# Patient Record
Sex: Female | Born: 1935 | Race: White | Hispanic: No | Smoking: Never smoker
Health system: Southern US, Community
[De-identification: ages and names within clinical notes are randomized; demographics above are authoritative.]

## PROBLEM LIST (undated history)

## (undated) DIAGNOSIS — N390 Urinary tract infection, site not specified: Secondary | ICD-10-CM

## (undated) DIAGNOSIS — G8929 Other chronic pain: Secondary | ICD-10-CM

## (undated) DIAGNOSIS — F0390 Unspecified dementia without behavioral disturbance: Secondary | ICD-10-CM

## (undated) DIAGNOSIS — M810 Age-related osteoporosis without current pathological fracture: Secondary | ICD-10-CM

## (undated) DIAGNOSIS — I2510 Atherosclerotic heart disease of native coronary artery without angina pectoris: Secondary | ICD-10-CM

## (undated) DIAGNOSIS — J449 Chronic obstructive pulmonary disease, unspecified: Secondary | ICD-10-CM

## (undated) DIAGNOSIS — I5032 Chronic diastolic (congestive) heart failure: Secondary | ICD-10-CM

## (undated) DIAGNOSIS — K56609 Unspecified intestinal obstruction, unspecified as to partial versus complete obstruction: Principal | ICD-10-CM

## (undated) DIAGNOSIS — N289 Disorder of kidney and ureter, unspecified: Secondary | ICD-10-CM

## (undated) DIAGNOSIS — K219 Gastro-esophageal reflux disease without esophagitis: Secondary | ICD-10-CM

## (undated) DIAGNOSIS — F329 Major depressive disorder, single episode, unspecified: Secondary | ICD-10-CM

## (undated) DIAGNOSIS — J42 Unspecified chronic bronchitis: Secondary | ICD-10-CM

## (undated) DIAGNOSIS — G20 Parkinson's disease: Secondary | ICD-10-CM

## (undated) DIAGNOSIS — K5900 Constipation, unspecified: Secondary | ICD-10-CM

## (undated) DIAGNOSIS — E785 Hyperlipidemia, unspecified: Secondary | ICD-10-CM

## (undated) DIAGNOSIS — G20A1 Parkinson's disease without dyskinesia, without mention of fluctuations: Secondary | ICD-10-CM

## (undated) DIAGNOSIS — A6009 Herpesviral infection of other urogenital tract: Secondary | ICD-10-CM

## (undated) DIAGNOSIS — E039 Hypothyroidism, unspecified: Secondary | ICD-10-CM

## (undated) DIAGNOSIS — R55 Syncope and collapse: Secondary | ICD-10-CM

## (undated) DIAGNOSIS — E079 Disorder of thyroid, unspecified: Secondary | ICD-10-CM

## (undated) DIAGNOSIS — D649 Anemia, unspecified: Secondary | ICD-10-CM

## (undated) DIAGNOSIS — F32A Depression, unspecified: Secondary | ICD-10-CM

## (undated) DIAGNOSIS — I10 Essential (primary) hypertension: Secondary | ICD-10-CM

## (undated) DIAGNOSIS — F419 Anxiety disorder, unspecified: Secondary | ICD-10-CM

## (undated) DIAGNOSIS — G629 Polyneuropathy, unspecified: Secondary | ICD-10-CM

## (undated) HISTORY — PX: FRACTURE SURGERY: SHX138

## (undated) HISTORY — DX: Herpesviral infection of other urogenital tract: A60.09

## (undated) HISTORY — DX: Hypothyroidism, unspecified: E03.9

## (undated) HISTORY — DX: Unspecified chronic bronchitis: J42

## (undated) HISTORY — DX: Urinary tract infection, site not specified: N39.0

## (undated) HISTORY — DX: Chronic diastolic (congestive) heart failure: I50.32

## (undated) HISTORY — DX: Constipation, unspecified: K59.00

## (undated) HISTORY — DX: Unspecified intestinal obstruction, unspecified as to partial versus complete obstruction: K56.609

---

## 2000-12-03 ENCOUNTER — Encounter: Payer: Self-pay | Admitting: Internal Medicine

## 2000-12-03 ENCOUNTER — Encounter: Admission: RE | Admit: 2000-12-03 | Discharge: 2000-12-03 | Payer: Self-pay | Admitting: Internal Medicine

## 2001-03-26 ENCOUNTER — Encounter: Payer: Self-pay | Admitting: Emergency Medicine

## 2001-03-26 ENCOUNTER — Emergency Department (HOSPITAL_COMMUNITY): Admission: EM | Admit: 2001-03-26 | Discharge: 2001-03-26 | Payer: Self-pay | Admitting: Emergency Medicine

## 2003-03-09 ENCOUNTER — Ambulatory Visit (HOSPITAL_COMMUNITY): Admission: RE | Admit: 2003-03-09 | Discharge: 2003-03-09 | Payer: Self-pay | Admitting: *Deleted

## 2003-03-09 ENCOUNTER — Encounter (INDEPENDENT_AMBULATORY_CARE_PROVIDER_SITE_OTHER): Payer: Self-pay | Admitting: *Deleted

## 2003-10-05 ENCOUNTER — Ambulatory Visit (HOSPITAL_COMMUNITY): Admission: RE | Admit: 2003-10-05 | Discharge: 2003-10-05 | Payer: Self-pay | Admitting: *Deleted

## 2004-03-20 ENCOUNTER — Ambulatory Visit (HOSPITAL_COMMUNITY): Admission: RE | Admit: 2004-03-20 | Discharge: 2004-03-20 | Payer: Self-pay | Admitting: Internal Medicine

## 2005-07-12 ENCOUNTER — Inpatient Hospital Stay (HOSPITAL_COMMUNITY): Admission: EM | Admit: 2005-07-12 | Discharge: 2005-07-18 | Payer: Self-pay | Admitting: Emergency Medicine

## 2005-07-14 ENCOUNTER — Encounter: Payer: Self-pay | Admitting: Neurosurgery

## 2005-07-18 ENCOUNTER — Ambulatory Visit: Payer: Self-pay | Admitting: Physical Medicine & Rehabilitation

## 2005-07-18 ENCOUNTER — Inpatient Hospital Stay (HOSPITAL_COMMUNITY)
Admission: RE | Admit: 2005-07-18 | Discharge: 2005-08-07 | Payer: Self-pay | Admitting: Physical Medicine & Rehabilitation

## 2006-03-13 ENCOUNTER — Ambulatory Visit: Payer: Self-pay | Admitting: Cardiology

## 2006-03-13 ENCOUNTER — Inpatient Hospital Stay (HOSPITAL_COMMUNITY): Admission: EM | Admit: 2006-03-13 | Discharge: 2006-03-18 | Payer: Self-pay | Admitting: Emergency Medicine

## 2006-03-17 ENCOUNTER — Encounter: Payer: Self-pay | Admitting: Internal Medicine

## 2006-03-17 ENCOUNTER — Ambulatory Visit: Payer: Self-pay | Admitting: Internal Medicine

## 2007-02-28 ENCOUNTER — Emergency Department (HOSPITAL_COMMUNITY): Admission: EM | Admit: 2007-02-28 | Discharge: 2007-02-28 | Payer: Self-pay | Admitting: Emergency Medicine

## 2010-05-21 ENCOUNTER — Emergency Department (HOSPITAL_COMMUNITY)
Admission: EM | Admit: 2010-05-21 | Discharge: 2010-05-22 | Disposition: A | Discharge status: Other Acute Inpatient Hospital | Payer: Self-pay | Source: Home / Self Care | Admitting: Emergency Medicine

## 2010-06-27 ENCOUNTER — Emergency Department (HOSPITAL_COMMUNITY): Payer: Medicare Other

## 2010-06-27 ENCOUNTER — Inpatient Hospital Stay (HOSPITAL_COMMUNITY)
Admission: EM | Admit: 2010-06-27 | Discharge: 2010-07-04 | DRG: 480 | Disposition: A | Discharge status: Skilled Nursing Facility | Payer: Medicare Other | Attending: Internal Medicine | Admitting: Internal Medicine

## 2010-06-27 DIAGNOSIS — E039 Hypothyroidism, unspecified: Secondary | ICD-10-CM | POA: Diagnosis present

## 2010-06-27 DIAGNOSIS — Y9210 Unspecified residential institution as the place of occurrence of the external cause: Secondary | ICD-10-CM | POA: Diagnosis present

## 2010-06-27 DIAGNOSIS — F329 Major depressive disorder, single episode, unspecified: Secondary | ICD-10-CM | POA: Diagnosis present

## 2010-06-27 DIAGNOSIS — S72143A Displaced intertrochanteric fracture of unspecified femur, initial encounter for closed fracture: Principal | ICD-10-CM | POA: Diagnosis present

## 2010-06-27 DIAGNOSIS — I5033 Acute on chronic diastolic (congestive) heart failure: Secondary | ICD-10-CM | POA: Diagnosis not present

## 2010-06-27 DIAGNOSIS — K219 Gastro-esophageal reflux disease without esophagitis: Secondary | ICD-10-CM | POA: Diagnosis present

## 2010-06-27 DIAGNOSIS — F0390 Unspecified dementia without behavioral disturbance: Secondary | ICD-10-CM | POA: Diagnosis present

## 2010-06-27 DIAGNOSIS — I129 Hypertensive chronic kidney disease with stage 1 through stage 4 chronic kidney disease, or unspecified chronic kidney disease: Secondary | ICD-10-CM | POA: Diagnosis present

## 2010-06-27 DIAGNOSIS — E785 Hyperlipidemia, unspecified: Secondary | ICD-10-CM | POA: Diagnosis present

## 2010-06-27 DIAGNOSIS — R5381 Other malaise: Secondary | ICD-10-CM | POA: Diagnosis present

## 2010-06-27 DIAGNOSIS — Z96649 Presence of unspecified artificial hip joint: Secondary | ICD-10-CM

## 2010-06-27 DIAGNOSIS — G589 Mononeuropathy, unspecified: Secondary | ICD-10-CM | POA: Diagnosis present

## 2010-06-27 DIAGNOSIS — T4275XA Adverse effect of unspecified antiepileptic and sedative-hypnotic drugs, initial encounter: Secondary | ICD-10-CM | POA: Diagnosis not present

## 2010-06-27 DIAGNOSIS — I9589 Other hypotension: Secondary | ICD-10-CM | POA: Diagnosis not present

## 2010-06-27 DIAGNOSIS — N183 Chronic kidney disease, stage 3 unspecified: Secondary | ICD-10-CM | POA: Diagnosis present

## 2010-06-27 DIAGNOSIS — F3289 Other specified depressive episodes: Secondary | ICD-10-CM | POA: Diagnosis present

## 2010-06-27 DIAGNOSIS — I2510 Atherosclerotic heart disease of native coronary artery without angina pectoris: Secondary | ICD-10-CM | POA: Diagnosis present

## 2010-06-27 DIAGNOSIS — N179 Acute kidney failure, unspecified: Secondary | ICD-10-CM | POA: Diagnosis not present

## 2010-06-27 DIAGNOSIS — I509 Heart failure, unspecified: Secondary | ICD-10-CM | POA: Diagnosis present

## 2010-06-27 DIAGNOSIS — Y9301 Activity, walking, marching and hiking: Secondary | ICD-10-CM

## 2010-06-27 DIAGNOSIS — G9389 Other specified disorders of brain: Secondary | ICD-10-CM | POA: Diagnosis not present

## 2010-06-27 DIAGNOSIS — W010XXA Fall on same level from slipping, tripping and stumbling without subsequent striking against object, initial encounter: Secondary | ICD-10-CM | POA: Diagnosis present

## 2010-06-27 DIAGNOSIS — D62 Acute posthemorrhagic anemia: Secondary | ICD-10-CM | POA: Diagnosis not present

## 2010-06-27 DIAGNOSIS — S52599A Other fractures of lower end of unspecified radius, initial encounter for closed fracture: Secondary | ICD-10-CM | POA: Diagnosis present

## 2010-06-27 DIAGNOSIS — N039 Chronic nephritic syndrome with unspecified morphologic changes: Secondary | ICD-10-CM | POA: Diagnosis present

## 2010-06-27 DIAGNOSIS — D631 Anemia in chronic kidney disease: Secondary | ICD-10-CM | POA: Diagnosis present

## 2010-06-27 LAB — BASIC METABOLIC PANEL
BUN: 22 mg/dL (ref 6–23)
Chloride: 102 mEq/L (ref 96–112)
Glucose, Bld: 103 mg/dL — ABNORMAL HIGH (ref 70–99)
Sodium: 140 mEq/L (ref 135–145)

## 2010-06-27 LAB — CARDIAC PANEL(CRET KIN+CKTOT+MB+TROPI)
CK, MB: 2.4 ng/mL (ref 0.3–4.0)
Relative Index: 2.2 (ref 0.0–2.5)
Total CK: 111 U/L (ref 7–177)
Troponin I: 0.01 ng/mL (ref 0.00–0.06)

## 2010-06-27 LAB — URINALYSIS, ROUTINE W REFLEX MICROSCOPIC
Ketones, ur: NEGATIVE mg/dL
Nitrite: NEGATIVE
Protein, ur: NEGATIVE mg/dL
Urobilinogen, UA: 0.2 mg/dL (ref 0.0–1.0)
pH: 7 (ref 5.0–8.0)

## 2010-06-27 LAB — APTT: aPTT: 30 seconds (ref 24–37)

## 2010-06-27 LAB — DIFFERENTIAL
Basophils Relative: 1 % (ref 0–1)
Eosinophils Relative: 8 % — ABNORMAL HIGH (ref 0–5)
Lymphocytes Relative: 23 % (ref 12–46)
Monocytes Absolute: 1.1 10*3/uL — ABNORMAL HIGH (ref 0.1–1.0)
Neutro Abs: 6.2 10*3/uL (ref 1.7–7.7)
Neutrophils Relative %: 58 % (ref 43–77)

## 2010-06-27 LAB — CBC
HCT: 41.6 % (ref 36.0–46.0)
MCV: 88.1 fL (ref 78.0–100.0)
Platelets: 325 10*3/uL (ref 150–400)
RBC: 4.72 MIL/uL (ref 3.87–5.11)
RDW: 15.7 % — ABNORMAL HIGH (ref 11.5–15.5)

## 2010-06-28 ENCOUNTER — Inpatient Hospital Stay (HOSPITAL_COMMUNITY): Payer: Medicare Other

## 2010-06-28 LAB — COMPREHENSIVE METABOLIC PANEL
ALT: 13 U/L (ref 0–35)
AST: 28 U/L (ref 0–37)
Calcium: 8.2 mg/dL — ABNORMAL LOW (ref 8.4–10.5)
Creatinine, Ser: 1.1 mg/dL (ref 0.4–1.2)
GFR calc Af Amer: 56 mL/min — ABNORMAL LOW (ref >60)
Sodium: 140 mEq/L (ref 135–145)
Total Protein: 5.5 g/dL — ABNORMAL LOW (ref 6.0–8.3)

## 2010-06-28 LAB — CBC
HCT: 32.7 % — ABNORMAL LOW (ref 36.0–46.0)
Hemoglobin: 10.7 g/dL — ABNORMAL LOW (ref 12.0–15.0)
MCHC: 32.7 g/dL (ref 30.0–36.0)
RDW: 15.9 % — ABNORMAL HIGH (ref 11.5–15.5)
WBC: 12.5 10*3/uL — ABNORMAL HIGH (ref 4.0–10.5)

## 2010-06-28 LAB — CARDIAC PANEL(CRET KIN+CKTOT+MB+TROPI)
CK, MB: 2 ng/mL (ref 0.3–4.0)
Relative Index: 0.9 (ref 0.0–2.5)
Troponin I: 0.01 ng/mL (ref 0.00–0.06)

## 2010-06-28 LAB — URINE CULTURE: Culture: NO GROWTH

## 2010-06-28 LAB — TSH: TSH: 1.42 u[IU]/mL (ref 0.350–4.500)

## 2010-06-29 LAB — IRON AND TIBC
Saturation Ratios: 32 % (ref 20–55)
UIBC: 159 ug/dL

## 2010-06-29 LAB — BASIC METABOLIC PANEL
BUN: 16 mg/dL (ref 6–23)
CO2: 27 mEq/L (ref 19–32)
Calcium: 8.1 mg/dL — ABNORMAL LOW (ref 8.4–10.5)
Chloride: 109 mEq/L (ref 96–112)
Creatinine, Ser: 0.91 mg/dL (ref 0.4–1.2)
GFR calc Af Amer: >60 mL/min (ref >60)
Glucose, Bld: 91 mg/dL (ref 70–99)

## 2010-06-29 LAB — CBC
Hemoglobin: 8.7 g/dL — ABNORMAL LOW (ref 12.0–15.0)
MCH: 28.2 pg (ref 26.0–34.0)
MCHC: 31.9 g/dL (ref 30.0–36.0)
MCV: 88.6 fL (ref 78.0–100.0)
RBC: 3.08 MIL/uL — ABNORMAL LOW (ref 3.87–5.11)

## 2010-06-29 LAB — TSH: TSH: 1.097 u[IU]/mL (ref 0.350–4.500)

## 2010-06-29 LAB — FERRITIN: Ferritin: 38 ng/mL (ref 10–291)

## 2010-06-29 LAB — FOLATE: Folate: 10.7 ng/mL

## 2010-06-29 LAB — VITAMIN B12: Vitamin B-12: 273 pg/mL (ref 211–911)

## 2010-06-30 LAB — BASIC METABOLIC PANEL
BUN: 21 mg/dL (ref 6–23)
Creatinine, Ser: 0.95 mg/dL (ref 0.4–1.2)
GFR calc non Af Amer: 55 mL/min — ABNORMAL LOW (ref >60)
Glucose, Bld: 92 mg/dL (ref 70–99)
Potassium: 4.1 mEq/L (ref 3.5–5.1)

## 2010-06-30 LAB — PROTIME-INR
INR: 1.12 (ref 0.00–1.49)
Prothrombin Time: 14.6 seconds (ref 11.6–15.2)

## 2010-06-30 LAB — CBC
Hemoglobin: 8.3 g/dL — ABNORMAL LOW (ref 12.0–15.0)
MCH: 28.1 pg (ref 26.0–34.0)
MCHC: 32 g/dL (ref 30.0–36.0)
Platelets: 200 10*3/uL (ref 150–400)
RDW: 16.2 % — ABNORMAL HIGH (ref 11.5–15.5)

## 2010-07-01 ENCOUNTER — Inpatient Hospital Stay (HOSPITAL_COMMUNITY): Payer: Medicare Other

## 2010-07-01 LAB — BASIC METABOLIC PANEL
BUN: 27 mg/dL — ABNORMAL HIGH (ref 6–23)
CO2: 24 mEq/L (ref 19–32)
Calcium: 7.8 mg/dL — ABNORMAL LOW (ref 8.4–10.5)
Calcium: 8 mg/dL — ABNORMAL LOW (ref 8.4–10.5)
Creatinine, Ser: 1.05 mg/dL (ref 0.4–1.2)
GFR calc non Af Amer: 49 mL/min — ABNORMAL LOW (ref >60)
Glucose, Bld: 89 mg/dL (ref 70–99)
Glucose, Bld: 108 mg/dL — ABNORMAL HIGH (ref 70–99)
Sodium: 140 mEq/L (ref 135–145)

## 2010-07-01 LAB — CROSSMATCH
Antibody Screen: NEGATIVE
Unit division: 0

## 2010-07-01 LAB — CBC
HCT: 24.2 % — ABNORMAL LOW (ref 36.0–46.0)
MCHC: 32.2 g/dL (ref 30.0–36.0)
Platelets: 207 10*3/uL (ref 150–400)
RDW: 16.2 % — ABNORMAL HIGH (ref 11.5–15.5)

## 2010-07-01 LAB — BRAIN NATRIURETIC PEPTIDE: Pro B Natriuretic peptide (BNP): 490 pg/mL — ABNORMAL HIGH (ref 0.0–100.0)

## 2010-07-01 LAB — PROTIME-INR: Prothrombin Time: 14.3 seconds (ref 11.6–15.2)

## 2010-07-02 LAB — BASIC METABOLIC PANEL
Chloride: 111 mEq/L (ref 96–112)
Creatinine, Ser: 0.95 mg/dL (ref 0.4–1.2)
GFR calc Af Amer: >60 mL/min (ref >60)
GFR calc non Af Amer: 55 mL/min — ABNORMAL LOW (ref >60)
Potassium: 3.9 mEq/L (ref 3.5–5.1)

## 2010-07-02 LAB — CBC
HCT: 26.1 % — ABNORMAL LOW (ref 36.0–46.0)
Hemoglobin: 8.2 g/dL — ABNORMAL LOW (ref 12.0–15.0)
RDW: 16.3 % — ABNORMAL HIGH (ref 11.5–15.5)
WBC: 10 10*3/uL (ref 4.0–10.5)

## 2010-07-02 LAB — PROTIME-INR
INR: 1.41 (ref 0.00–1.49)
Prothrombin Time: 17.5 seconds — ABNORMAL HIGH (ref 11.6–15.2)

## 2010-07-03 ENCOUNTER — Inpatient Hospital Stay (HOSPITAL_COMMUNITY): Payer: Medicare Other

## 2010-07-03 LAB — CBC
MCV: 89.6 fL (ref 78.0–100.0)
Platelets: 316 10*3/uL (ref 150–400)
RBC: 3.17 MIL/uL — ABNORMAL LOW (ref 3.87–5.11)
RDW: 16.6 % — ABNORMAL HIGH (ref 11.5–15.5)
WBC: 11.7 10*3/uL — ABNORMAL HIGH (ref 4.0–10.5)

## 2010-07-03 LAB — BASIC METABOLIC PANEL
CO2: 26 mEq/L (ref 19–32)
Calcium: 8.2 mg/dL — ABNORMAL LOW (ref 8.4–10.5)
Creatinine, Ser: 1.04 mg/dL (ref 0.4–1.2)
GFR calc Af Amer: >60 mL/min (ref >60)
GFR calc non Af Amer: 50 mL/min — ABNORMAL LOW (ref >60)
Sodium: 141 mEq/L (ref 135–145)

## 2010-07-03 LAB — BRAIN NATRIURETIC PEPTIDE: Pro B Natriuretic peptide (BNP): 1060 pg/mL — ABNORMAL HIGH (ref 0.0–100.0)

## 2010-07-03 LAB — PROTIME-INR: INR: 1.62 — ABNORMAL HIGH (ref 0.00–1.49)

## 2010-07-04 LAB — CBC
MCV: 90.6 fL (ref 78.0–100.0)
Platelets: 343 10*3/uL (ref 150–400)
RBC: 3.1 MIL/uL — ABNORMAL LOW (ref 3.87–5.11)
RDW: 17.3 % — ABNORMAL HIGH (ref 11.5–15.5)
WBC: 12.4 10*3/uL — ABNORMAL HIGH (ref 4.0–10.5)

## 2010-07-04 LAB — BASIC METABOLIC PANEL
CO2: 26 mEq/L (ref 19–32)
Calcium: 7.7 mg/dL — ABNORMAL LOW (ref 8.4–10.5)
Creatinine, Ser: 0.95 mg/dL (ref 0.4–1.2)
GFR calc Af Amer: >60 mL/min (ref >60)
GFR calc non Af Amer: 55 mL/min — ABNORMAL LOW (ref >60)
Sodium: 141 mEq/L (ref 135–145)

## 2010-07-04 LAB — PROTIME-INR
INR: 2.72 — ABNORMAL HIGH (ref 0.00–1.49)
Prothrombin Time: 28.9 seconds — ABNORMAL HIGH (ref 11.6–15.2)

## 2010-07-06 NOTE — Discharge Summary (Signed)
NAME:  [name] [name], [name] [name]                 ACCOUNT NO.:  [id number]  MEDICAL RECORD NO.:  [id number]           PATIENT TYPE:  I  LOCATION:  1603                         FACILITY:  [facility]  PHYSICIAN:  Hillery Aldo, M.D.   DATE OF BIRTH:  May 31, 1917  DATE OF ADMISSION:  06/27/2010 DATE OF DISCHARGE:  07/04/2010                              DISCHARGE SUMMARY   PRIMARY CARE PHYSICIAN:  Lenon Curt. Chilton Si, M.D.  DISCHARGE DIAGNOSES: 1. Left hip fracture. 2. Left radius fracture. 3. Respiratory depression secondary to narcotics. 4. Hypotension. 5. Acute-on-chronic diastolic congestive heart failure. 6. Acute kidney injury. 7. Stage 3 chronic kidney disease. 8. Acute blood loss anemia in the setting of anemia of chronic     disease. 9. Hypertension. 10.Hyperlipidemia. 11.Hypothyroidism. 12.Dementia. 13.Depression. 14.Neuropathy. 15.Gastroesophageal reflux disease. 16.History of coronary artery disease. 17.Deconditioning.  DISCHARGE MEDICATIONS: 1. Coreg 6.25 mg p.o. b.i.d. 2. Lovenox 30 mg subcutaneously b.i.d. until INR therapeutic for 48     hours. 3. Ferrous sulfate 325 mg p.o. b.i.d. 4. Oxycodone 5/325 one tablet p.o. q.4 h. p.r.n. pain. 5. Warfarin 6 mg p.o. daily or as directed by PCP to keep INR between     2 and 3. 6. Acyclovir 200 mg p.o. daily. 7. Aspirin 81 mg p.o. daily. 8. Benazepril 40 mg p.o. daily. 9. Furosemide 40 mg p.o. daily. 10.Gabapentin 100 mg p.o. t.i.d. 11.Isosorbide mononitrate XR 30 mg p.o. daily. 12.Klor-Con 20 mEq p.o. daily. 13.Levothyroxine 25 mcg, 1/2 tablet p.o. daily. 14.Lexapro 20 mg p.o. daily. 15.Lipitor 10 mg p.o. daily. 16.Namenda 10 mg p.o. b.i.d. 17.Omeprazole 20 mg p.o. daily. 18.Os-Cal plus D 1 tablet p.o. daily. 19.Polyethylene glycol 17 g p.o. daily. 20.Senokot-S 8.6-50 two tablets p.o. q.h.s. 21.Seroquel 25 mg p.o. q.h.s. 22.Tylenol 650 mg p.o. q.6 h. p.r.n. headache, fever, or general     aches. 23.Xanax 0.25 mg p.o. q.h.s.  p.r.n. sleep.  CONSULTATIONS:  Claude Manges. Cleophas Dunker, M.D., of Orthopedics.  BRIEF ADMISSION HISTORY OF PRESENT ILLNESS:  The patient is a 77 year old female who had a mechanical fall in her home and was brought to the emergency department for evaluation where she was diagnosed with an acute left hip fracture.  She subsequently was referred to the Hospitalist Service for inpatient management.  For the full details, please see the dictated report done by Dr. Selena Batten.  PROCEDURES AND DIAGNOSTIC STUDIES: 1. Chest x-ray on June 27, 2010, showed mild left basilar     atelectasis. 2. Right hand films on June 27, 2010, showed a nondisplaced     impaction fracture of the distal radius. 3. Left hip films on June 27, 2010, showed a left hip     intertrochanteric fracture. 4. Status post ORIF of left intertrochanteric hip fracture performed     by Dr. Cleophas Dunker with postoperative films showing no evidence of     complication. 5. Chest x-ray on July 01, 2010, showed congestive heart failure     with bilateral pleural effusions.6. Chest x-ray on July 03, 2010, showed no significant change.     Findings of pulmonary edema and small effusions.  DISCHARGE LABORATORY VALUES:  Labs  on July 03, 2010, showed a BNP of 1060.  Sodium was 140, potassium 3.7, chloride 105, bicarb 26, BUN 27, creatinine 1.04, glucose 93, calcium 8.2.  PT was 19.4 with an INR of 1.62.  White blood cell count was 11.7, hemoglobin 9.1, hematocrit 28.4, platelets 316.  TSH was 1.097.  An anemia panel was consistent with anemia of chronic disease with a normal iron of 74 and a total iron- binding capacity low at 233.  No deficiencies of vitamin B12 or serum folate were noted.  HOSPITAL COURSE BY PROBLEM: 1. Mechanical fall with trauma to the left hip and left radius     including left hip fracture and left radius fracture:  The patient     was seen and evaluated by Dr. Cleophas Dunker of Orthopedic Surgery.  She      underwent an open reduction and internal fixation of her left hip     fracture and a splint was applied to the left radius fracture.  The     patient will need ongoing skilled nursing home care for intensive     rehabilitation with physical and occupational therapy. 2. Respiratory depression and hypotension secondary to narcotics:  The     patient did develop postoperative sedation and respiratory     depression.  She required Narcan and responded to this with     immediate improvement in her respiratory status.  Her hypotension     was addressed with fluid challenges. 3. Acute-on-chronic diastolic congestive heart failure:  The patient     received a blood transfusion and IV fluid boluses due to     postoperative hypotension.  She ultimately developed signs of     respiratory distress and a diagnostic evaluation revealed     decompensated heart failure.  She has a history of diastolic     dysfunction.  Her Lasix was held in the perioperative period given     her acute kidney injury and she ultimately required IV diuretic     therapy and diuresis for her decompensated heart failure.  Her I     and O balance has been negative for the past 48 hours and     clinically, she is improving.  Chest radiographs did not show any     change as of yet, but we will repeat a BNP and a chest radiograph     prior to discharge in the morning to ensure that she remained     stable.  She has been put back on her usual home dose of Lasix as     well. 4. Acute kidney injury in the setting of stage 3 chronic kidney     disease:  The patient was admitted with a creatinine of 1.46.  Her     baseline creatinine was not known, but it was felt that she likely     had underlying stage 3 chronic kidney disease.  Her creatinine has     stabilized and has ranged from 0.9 to 1.04 over the past 48 hours.     Because of her underlying stage 3 chronic kidney disease, routine     therapy with MOBIC is not recommended.   This was, therefore,     discontinued. 5. Acute blood loss anemia/anemia of chronic disease:  The patient     does have a chronic anemia secondary to her kidney disease.  In     addition, she had an acute drop in her hemoglobin postoperatively.  She responded well to the blood transfusion and will be discharged     on iron therapy. 6. Hypertension:  The patient had suboptimal blood pressure control on     initial presentation prompting [place] to put her on Coreg.  At this     point, it is hoped that with the addition back of her diuretic her     blood pressure will improve. 7. Hyperlipidemia:  The patient was maintained on statin therapy. 8. Hypothyroidism:  The patient is appropriately replaced on her     current dose of Synthroid. 9. Dementia:  The patient continues on Namenda and Seroquel. 10.Depression:  The patient is maintained on Lexapro. 11.Neuropathy:  The patient's symptoms have responded well to     Neurontin. 12.Gastroesophageal reflux disease:  The patient is maintained on a     proton pump inhibitor therapy. 13.History of coronary artery disease:  The patient was continued with     perioperative beta blockade and also Imdur.  She is on daily     aspirin as well as statin therapy.  She had no evidence of acute     coronary syndrome while in the hospital.  DISPOSITION:  The patient is approaching medical stability and is anticipated to discharge to her skilled nursing facility in the morning after she receives additional diuretic therapy tonight.  A discharge summary addendum will be dictated if she has any change in her status.     Hillery Aldo, M.D.     CR/MEDQ  D:  07/03/2010  T:  07/03/2010  Job:  [id number]  cc:   Lenon Curt. Chilton Si, M.D. Fax: [phone]  Electronically Signed by Hillery Aldo M.D. on 07/06/2010 03:12:58 PM

## 2010-07-10 NOTE — Op Note (Signed)
NAME:  [name] [name], [name] [name]                 ACCOUNT NO.:  [id number]  MEDICAL RECORD NO.:  [id number]           PATIENT TYPE:  I  LOCATION:  1603                         FACILITY:  [facility]  PHYSICIAN:  Claude Manges. Gilberto Streck, M.D.DATE OF BIRTH:  1917-08-22  DATE OF PROCEDURE:  06/28/2010 DATE OF DISCHARGE:                              OPERATIVE REPORT   PREOPERATIVE DIAGNOSIS:  Displaced two-part intertrochanteric fracture, left hip.  POSTOPERATIVE DIAGNOSIS:  Displaced two-part intertrochanteric fracture, left hip.  PROCEDURE:  Compression screw fixation of intertrochanteric fracture, left hip.  SURGEON:  Claude Manges. Cleophas Dunker, M.D.  ASSISTANT:  Arlys John D. Petrarca, P.A.-C.  ANESTHESIA:  General.  COMPLICATIONS:  None.  PROCEDURE:  [name] [name] was met with her 2 sons in the holding area.  I marked the left lower extremity as the appropriate operative extremity. She was in Buck's traction.  The left lower extremity was short and externally rotated.  [name] [name] was then transported to room #11 and placed on the fracture table and then carefully placed under general anesthesia without difficulty.  The right lower extremity had had a prior right total hip replacement.  It was carefully placed in 90-90 position.  Reduction procedure was then performed to the left lower extremity, it was then placed in distal traction.  Image intensification revealed considerable improvement in the position.  There was slight medial position of the shaft compared to the proximal femur which looks like it was a two-part intertrochanteric fracture.  There was small avulsion off the lesser trochanter but it otherwise appeared to be intact.  The hip was then prepped with DuraPrep and iliac crest and below knee sterile draping was performed.  A longitudinal incision was made beginning at the level of the base of the greater trochanter extending distally via sharp dissection. Incision was carried down to  subcutaneous tissue.  Incision measured approximately 4 inches to 5 inches in length.  Gross bleeders were Bovie coagulated.  Adipose tissue was incised with the Bovie.  A self- retaining retractor was inserted.  The iliotibial band was identified and incised along the length of the skin incision.  Self-retaining retractors were placed more deeply.  The vastus lateralis muscle was identified.  It was retracted superiorly and its fascia was incised near its attachment to the posterior aspect of the femur.  Using a Cobb elevator, muscle fibers were then elevated from the proximal femur and a Bennett retractor was inserted.  A [place] clamp was then applied to the proximal femoral shaft and I was able to oppose it to the more proximal fragment.  I thought it was nearly anatomic.  The [place] clamp was then removed.  I proceeded with the compression screw fixation using the Ace - DePuy TK2 system.  A  guide pin was then placed through the base of the femoral neck at 135-degree angle using the angle finder.  In both AP and lateral projections, I thought it had excellent position.  I measured a 90 mm screw and then performed a step cortical cut with a step cortical cut reamer, checking my position with AP and lateral image intensification.  The TK2 threaded titanium screw was then inserted.  With the hip reduced, I was applied a 5-hole 135- degree nonlocking side plate and then maintained position with Lowman bone clamp.  Each of the 5 holes and the plate were then sequentially drilled, measurement filled with self-tapping cortical screws.  At that point, I obtained image intensification, had excellent position of the entire device including the screws with excellent apposition of the plate to the bone and what appeared to be anatomic position of the fracture.  Traction was released distally.  A set screw was inserted to provide compression.  Again x-rays revealed excellent position.  The wound  was then irrigated with saline solution.  The vastus lateralis fascia closed anatomically with a running 0 Vicryl.  I had nice hemostasis.  The iliotibial band was then closed with a similar material, subcu closed in several layers with 0 and 2-0 Vicryl, skin closed with skin clips. Sterile bulky dressing was applied followed by Mepilex stressing  The patient was then transported to the operating room on a stretcher and then to the postanesthesia recovery in satisfactory condition.     Claude Manges. Cleophas Dunker, M.D.     PWW/MEDQ  D:  06/28/2010  T:  06/29/2010  Job:  [id number]  Electronically Signed by Norlene Campbell M.D. on 07/10/2010 11:17:18 AM

## 2010-07-19 NOTE — Discharge Summary (Addendum)
  NAME:  [name] [name], [name] [name]                 ACCOUNT NO.:  [id number]  MEDICAL RECORD NO.:  [id number]           PATIENT TYPE:  I  LOCATION:  1603                         FACILITY:  [facility]  PHYSICIAN:  Kela Millin, M.D.DATE OF BIRTH:  [birth year]  DATE OF ADMISSION:  06/27/2010 DATE OF DISCHARGE:                              DISCHARGE SUMMARY   ADDENDUM:  DISCHARGE MEDICATIONS:  Coumadin changed to 4 mg p.o. daily or as directed per PCP/nursing home MD.  She is to have a PT/INR done in the morning and if the INR is still therapeutic (2-3 range), the Lovenox should be discontinued.  Please see the discharge summary dictated on July 03, 2010, for the full details of the patient's hospitalization as well as complete discharge medication list.  Her brain natriuretic peptide is improved to 734 today, and she is medically stable for discharge to the skilled nursing facility today.     Kela Millin, M.D.     ACV/MEDQ  D:  07/04/2010  T:  07/04/2010  Job:  [id number]  cc:   Lenon Curt. Chilton Si, M.D. Fax: [phone]  Electronically Signed by Donnalee Curry M.D. on 07/17/2010 06:11:23 PM Electronically Signed by Donnalee Curry M.D. on 07/17/2010 11:91:47 PM Electronically Signed by Donnalee Curry M.D. on 07/17/2010 07:32:31 PM

## 2010-08-01 NOTE — H&P (Signed)
NAME:  [name] [name]                 ACCOUNT NO.:  [id number]  MEDICAL RECORD NO.:  [id number]           PATIENT TYPE:  E  LOCATION:  WLED                         FACILITY:  [facility]  PHYSICIAN:  Massie Maroon, MD        DATE OF BIRTH:  10-11-1917  DATE OF ADMISSION:  06/27/2010 DATE OF DISCHARGE:                             HISTORY & PHYSICAL   CHIEF COMPLAINT:  "I fell."  HISTORY OF PRESENT ILLNESS:  A 77 year old female with a history of hypertension, hyperlipidemia, cervical spinal stenosis, apparently presents with complaints of fall.  The patient was apparently in a lounge chair and then tried to go to the bathroom when she fell.  She cannot recall if she tripped, but she denies any syncope, chest pain, palpitations, shortness of breath, nausea, vomiting.  The patient was brought to the ER for evaluation and noted to have a left hip fracture. The patient will be admitted by Hospitalist Service and Orthopedics has consulted and we appreciate Dr. Hoy Register input.  PAST MEDICAL HISTORY: 1. Hypertension. 2. Hyperlipidemia. 3. History of syncope secondary to hypotension and bradycardia. 4. History of bradycardia secondary to transient AV nodal ischemia. 5. CAD (EF 61%). 6. History of CHF. 7. History of cervical spinal stenosis/myelopathy status post     corrective surgery, July 12, 2005. 8. History of constipation and hemorrhoids. 9. ? Head tremor ? Parkinson's versus essential tremor.  PAST SURGICAL HISTORY: 1. Cholecystectomy. 2. Appendectomy. 3. TAH-BSO. 4. Cataract surgery. 5. Cervical laminectomy. 6. Hip replacement. 7. Breast biopsy.  SOCIAL HISTORY:  The patient does not smoke or drink.  She lives at [facility].  She is widowed and has 4 children.  FAMILY HISTORY:  Mother died at age 54 of old age.  Father had CAD and died at age 10 from some type of esophageal problem.  There also appears to be a history of stroke.  ALLERGIES:  No known drug  allergies.  MEDICATIONS: 1. Levothyroxine 25 mcg one-half p.o. q.a.m. 2. Enteric-coated aspirin 81 mg p.o. daily. 3. Acyclovir 200 mg p.o. daily. 4. Omeprazole 20 mg p.o. daily. 5. Polyethylene glycol 17 g p.o. daily. 6. Lipitor 10 mg p.o. daily. 7. Os-Cal with D one p.o. daily. 8. Meloxicam 7.5 mg p.o. daily. 9. Potassium chloride 20 mEq p.o. daily. 10.Isosorbide mononitrate XR 60 mg one-half p.o. daily. 11.Furosemide 40 mg p.o. daily. 12.Benazepril 40 mg p.o. daily. 13.Senokot-S 8.6 mg/50 mg 2 p.o. q.h.s. 14.Seroquel 25 mg p.o. q.h.s. 15.Xanax 0.5 mg p.o. q.h.s. 16.Gabapentin 100 mg p.o. t.i.d. 17.OxyContin 10 mg p.o. b.i.d. 18.Namenda 10 mg p.o. b.i.d. 19.Lexapro 20 mg p.o. daily. 20.Tylenol 325 mg 2 p.o. q.6 h. p.r.n.  REVIEW OF SYSTEMS:  Negative for all 10-organ systems except for pertinent positives stated above.  PHYSICAL EXAMINATION:  VITAL SIGNS:  Temperature 98.7, pulse 71, blood pressure 183/79, pulse ox is 96% on room air. HEENT:  Anicteric. NECK:  No JVD, no bruit. HEART:  Regular rate and rhythm, S1, S2. LUNGS:  Clear to auscultation bilaterally except faint crackles at the left base. ABDOMEN:  Soft, nontender, nondistended.  Positive  bowel sounds. EXTREMITIES:  No cyanosis, clubbing, or edema. SKIN:  Laceration or break of skin around the 1st digit, now sutured. There is a skin tear on the right forearm. LYMPH NODES:  No adenopathy. NEURO EXAM:  Nonfocal, cranial nerves II through XII intact.  Reflexes 2+, symmetric, diffuse with downgoing toes bilaterally, strength 5/5 in all 4 extremities, pinprick intact.  LABORATORY DATA:  Urinalysis negative.  PTT 30, PT 30.1, INR 0.97. Sodium 140, potassium 4.3, BUN 22, creatinine 1.46.  WBC 10.8, hemoglobin 13.5, platelet count 325.  X-ray of the left hip shows a left hip intertrochanteric fracture, nondisplaced impaction fracture at the distal radius.  Chest x-ray shows mild left basilar atelectasis.  EKG;  normal sinus rhythm at 65, borderline left axis deviation, poor R- wave progression, no ST-T segment changes consistent with acute ischemia, no prior EKG for comparison.  ASSESSMENT AND PLAN: 1. Left hip intertrochanteric fracture:  The patient placed on     telemetry because of a fall.  We will check CK, CK-MB, troponin I     q.6 h. x3 sets.  The patient will be made n.p.o. after midnight.     We will use SCDs for deep venous thrombosis prophylaxis.  The     patient will be placed in Buck's traction.  The patient will have a     Velcro wrist splint to right forearm to immobilize the right wrist     due to nondisplaced fracture.  The patient will be on OxyContin 10     mg p.o. b.i.d. for baseline pain and then oxycodone for     breakthrough pain. 2. Skin abrasion:  Right forearm, DuoDerm to skin laceration around     right thumb status post suturing by ER physician, appreciate input. 3. Hypertension:  Continue present blood pressure medications minus     furosemide. 4. Hypothyroidism.  Continue levothyroxine. 5. Coronary artery disease.  Continue Lipitor, Imdur, benazepril.  We     will start her on carvedilol 3.125 mg p.o. b.i.d.  Because of     history of apical ischemia, please consider consultation with     Cardiology in the morning for cardiac clearance. 6. Gastroesophageal reflux disease:  Continue omeprazole. 7. Code status:  DNR.     Massie Maroon, MD     JYK/MEDQ  D:  06/27/2010  T:  06/27/2010  Job:  [id number]  cc:   Lenon Curt. Chilton Si, M.D. Fax: [phone]  Electronically Signed by Pearson Grippe MD on 08/01/2010 10:29:09 PM

## 2010-09-28 NOTE — Discharge Summary (Signed)
NAME:  [name], [name]                 ACCOUNT NO.:  [id number]   MEDICAL RECORD NO.:  [id number]          PATIENT TYPE:  IPS   LOCATION:  4028                         FACILITY:  [facility]   PHYSICIAN:  Ellwood Dense, M.D.   DATE OF BIRTH:  Apr 09, 1918   DATE OF ADMISSION:  07/18/2005  DATE OF DISCHARGE:                                 DISCHARGE SUMMARY   DISCHARGE DIAGNOSES:  1.  C4-5 stenosis with cord compression and myelopathy requiring anterior      cervical diskectomy .  2.  Hypertension.  3.  Constipation.  4.  Insomnia.  5.  Genital herpes.  6.  Abnormal liver function tests, resolving.   HISTORY OF PRESENT ILLNESS:  [name] is an 77 year old female with  history of hypertension, Parkinson's disease, admitted March 2 with  progressive weakness and numbness in bilateral lower extremities to  bilateral upper extremities and difficulty with ambulation. Neurology  consulted for input.  Patient's workup revealed C3-C4 spondylosis with  stenosis and cord compression with myelopathy.  The patient stared on  steroids and underwent anterior cervical diskectomy at C3-C4 with  arthrodesis on July 14, 2005, by Dr. Franky Macho.  Postop noted to have  decreased neuropathy and some improvement in mobility.  Currently patient  requiring assist with mobility and self care and rehab consulted for further  therapy.   PAST MEDICAL HISTORY:  Significant for:  1.  Hypertension.  2.  CHF.  3.  Macular degeneration.  4.  Cholecystectomy.  5.  Hysterectomy.  6.  Dysphagia in the past requiring dilatation.  7.  Paratrichosis.  8.  History of genital herpes.  9.  Right total hip replacement.  10. Hemorrhoids.  11. Questionable Parkinson's disease.   ALLERGIES:  No known drug allergies.   FAMILY HISTORY:  Positive for coronary artery disease and cancer.   SOCIAL HISTORY:  The patient lives at [facility] and was living in  independent living situation a couple of weeks prior to admission.   She does  not use any tobacco or alcohol. Has history of drug addiction in the past.   HOSPITAL COURSE:  [name] was admitted to rehab on July 18, 2005,  inpatient therapies to consist of PT, OT daily.  Neck incision was noted to  be healing well at time of admission.  This has continued to heal without  signs or symptoms of infection.  Pain control is managed with p.r.n.  oxycodone.  Subcutaneous Lovenox was added for DVT prophylaxis and was  continued throughout the stay.  Acyclovir was resumed secondary to patient's  history of genital herpes.  Followup labs done past admission include check  of CBC revealing hemoglobin 13.4, hematocrit 38.5, white count 13.4,  platelets 249.  Check of lytes revealed sodium 140, potassium 3.6, chloride  105, CO2 29, BUN 16, creatinine 0.7, glucose 97.  LFTs were noted to be  abnormal with SGOT at 51, SGPT 78, alkaline phosphatase 160, total bilirubin  0.7.  Rechecked LFTs March 13 shows improvement with AST 33, ALT 44,  alkaline phosphatase 140.  UA was  done past admission.  Urine culture March  12 showed greater than 100,000 colonies of Proteus mirabilis.  The patient  was treated with Bactrim DS, and followup urine March 22 shows 9000 colonies  insignificant growth.   The patient's p.o. intake has been good.  Wound has been stable, and she has  participated in therapy.  Blood pressure has been monitored on twice daily  basis throughout her stay and noted to be variable from 110s to occasional  high in 150 range.  Diastolic ranging from 50s to 70s.  The patient's p.o.  intake has been good.  She has been continent of bowel and bladder.  The  patient does continue with numbness bilateral upper extremities distally  greater than proximally.  Strength has been improving.  The patient  currently supervision for toilet transfers and toileting.  She still  requires minimum assistance for dressing secondary to decreased finger  motion manipulation  bilaterally and decreased sensation, proprioception  bilateral hands.  In terms of mobility, she is at supervision ambulating 200  feet with rolling walkers, requires verbal cues for good posture.  She is at  supervision for car transfers, able to navigate wheelchair greater than 150  feet with supervision throughout space and increased time.  The patient will  need further followup PT, OT at [facility] past discharge.  She is to be  discharged SNF level with transition to assisted living facility over the  next few weeks.  On August 07, 2005, the patient is discharged to [facility].   DISCHARGE ONE MEDICATIONS:  1.  Aciphex 30 mg a day.  2.  Lexapro 10 mg a day.  3.  Os-Cal Plus D 1 p.o. twice daily.  4.  Zovirax 200 mg per day.  5.  Vitamin C 1000 mg a day.  6.  Dulcolax tabs 10 mg in a.m., 5 mg in p.m.  7.  Colace 100 mg p.o. twice daily.  8.  Celebrex 200 mg per day.  9.  Lotensin 20 mg twice daily.  10. Lopressor 25 mg twice daily.  11. Flexeril 5 mg 3 times a day p.r.n.  12. Restoril 20 mg p.o. nightly p.r.n.  13. Dulcolax suppository 1 per rectum nightly p.r.n.  14. Oxycodone IR 5 to 10 mg q. 6-8 h p.r.n. pain.  15. Proctofoam cream per rectum twice daily p.r.n.   ACTIVITY:  As tolerated with supervision.   DIET:  Regular.   WOUND CARE:  Keep area clean and dry, wash with antibacterial soap and  water.   SPECIAL INSTRUCTIONS:  Progressive PT, OT to continue past discharge.   FOLLOW UP:  The patient is to follow up local MD for recheck in 2 weeks to  include check of blood pressure as well as LFTs.  Follow up with Dr. Franky Macho  for postop check.  Follow up with Dr. Lamar Benes as needed.      Greg Cutter, P.A.    ______________________________  Ellwood Dense, M.D.    PP/MEDQ  D:  08/06/2005  T:  08/06/2005  Job:  [id number]   cc:   Coletta Memos, M.D.  Fax: [phone]   Bevelyn Buckles. Nash Shearer, M.D.  Fax: [phone]  Lenon Curt. Chilton Si, M.D.  Fax: [phone]

## 2010-09-28 NOTE — Op Note (Signed)
NAME:  [name] [name], [name] [name]                           ACCOUNT NO.:  [id number]   MEDICAL RECORD NO.:  [id number]                   PATIENT TYPE:  AMB   LOCATION:  ENDO                                 FACILITY:  [facility]   PHYSICIAN:  Georgiana Spinner, M.D.                 DATE OF BIRTH:  05-Oct-1917   DATE OF PROCEDURE:  10/05/2003  DATE OF DISCHARGE:                                 OPERATIVE REPORT   PROCEDURE:  Colonoscopy.   INDICATIONS:  Colon cancer screening, rectal bleeding.   ANESTHESIA:  1. Demerol 70 mg.  2. Versed 6.5 mg.   DESCRIPTION OF PROCEDURE:  With patient mildly sedated in the left lateral  decubitus position, the Olympus videoscopic colonoscope was inserted in the  rectum and passed under direct vision to the cecum, identified by the  ileocecal valve and appendiceal orifice, both of which were photographed.  From this point, the colonoscope was slowly withdrawn, taking  circumferential views of the colonic mucosa, stopping only in the rectum  which appeared normal on direct, showed hemorrhoids on retroflexed view.  The endoscope was straightened and withdrawn.  The patient's vital signs and  pulse oximeter remained stable.  The patient tolerated the procedure well  without apparent complications.   FINDINGS:  Internal hemorrhoids, otherwise unremarkable exam.   PLAN:  Have patient follow up with me as needed.                                               Georgiana Spinner, M.D.    GMO/MEDQ  D:  10/05/2003  T:  10/05/2003  Job:  [id number]   cc:   Lenon Curt Chilton Si, M.D.  [address]  [place]  [place] [address]  Fax: [phone]

## 2010-09-28 NOTE — H&P (Signed)
NAME:  [name] [name], [name] [name]                 ACCOUNT NO.:  [id number]   MEDICAL RECORD NO.:  [id number]          PATIENT TYPE:  INP   LOCATION:  3735                         FACILITY:  [facility]   PHYSICIAN:  Isidor Holts, M.D.  DATE OF BIRTH:  1917-08-06   DATE OF ADMISSION:  03/13/2006  DATE OF DISCHARGE:  03/18/2006                                HISTORY & PHYSICAL   PRIMARY CARE PHYSICIAN:  Lenon Curt. Chilton Si, M.D.   DISCHARGE DIAGNOSES:  1. Syncopal episode, secondary to hypotension and bradycardia.  2. Bradycardia, likely secondary to transient AV node ischemia.  3. Coronary artery disease, status post mildly positive adenosine Myoview      03/17/2004. There was mild anterior apical ischemia with EF of 61%.  4. History of CHF.  5. Hypertension.  6. History of cervical spinal stenosis/myelopathy, status post corrective      surgery 07/12/2005.  7. History of constipation hemorrhoids.  8. ? Parkinsonism.  9. Acute renal insufficiency, secondary to dehydration, diuretics and ACE      inhibitor.   DISCHARGE MEDICATIONS:  1. Acyclovir 200 mg p.o. daily.  2. Xanax 0.25 mg p.o. b.i.d.  3. Benazepril 20 mg p.o. daily (was on 20 mg p.o. b.i.d.).  4. Bisacodyl 100 mg p.o. b.i.d.  5. Calcium 500 mg p.o. daily.  6. Celebrex 200 mg p.o. daily.  7. Gabapentin 100 mg p.o. t.i.d.  8. Lasix 20 mg p.o. daily (was on 40 mg p.o. daily).  9. Lexapro 20 mg p.o. daily.  10.Lipitor 10 mg p.o. daily.  11.Lovastatin 40 mg p.o. daily.  12.Prilosec 20 mg p.o. daily.  13.Aspirin, enteric coated, 81 mg p.o. daily.  14.Imdur 30 mg p.o. daily.  15.Potassium chloride 20 mEq p.o. daily.   Note:  Spironolactone has been discontinued, until reviewed by primary MD.   PROCEDURES:  1. Portable chest x-ray dated 03/13/2006.  This showed a density at right      long apex which could represent sclerosis in the right first rib,      scarring, or a neoplastic pulmonary nodule.  Chest CT recommended for      further  characterization.  2. Chest CT angiogram dated 03/13/2006.  This showed no evidence of      pulmonary embolism or acute findings.  There was COPD, also right      apical pleural-parenchymal scarring, no suspicious pulmonary nodules or      mass.  3. A 2-D echocardiogram dated 03/17/2006.  This showed possible small      perimembranous ASD, overall left ventricular systolic function was      normal.  LVEF 65%, no left ventricular regional wall motion      abnormalities.  Right ventricle mildly dilated.  Estimated peak      pulmonary artery systolic pressure mild-to-moderately increased.  Right      atrium was moderately to markedly dilated.   CONSULTATIONS:  Dr. Jesse Sans. Wall, cardiologist.   ADMISSION HISTORY:  As in H&P notes of 03/13/2006, dictated by Dr. Jamison Oka.  However, in brief, this is an 77 year old female, with known  history of congestive heart failure, hypertension, prior cervical spinal  spondylosis/stenosis/myelopathy status post corrective surgery on  07/12/2005, constipation, hemorrhoids, herpes simplex, macular degeneration,  possible Parkinson's disease, who presented following a syncopal episode  which occurred on returning to her assisted living facility after visiting  her neurosurgeon, Dr. Franky Macho, in the a.m. of 03/13/2006 on routine followup  visit.  She was found to have a profound bradycardia of 38-44 per minute and  was hypotensive on arrival at the emergency department via EMS, with a BP of  85/20.  She responded to a single intravenous injection of atropine  administered in the emergency department, with improvement in heart rate.  Patient was subsequently admitted for further evaluation, investigation and  management.   CLINICAL COURSE BY PROBLEM LIST:  Problem #1:  SYNCOPAL EPISODE.  This is multifactorial, occurring as the  result of a combination of profound sinus bradycardia and hypotension.  The  patient was given a single intravenous  injection of atropine with good  clinical response.  She was subsequently admitted to the step-down unit for  close monitoring.  She was placed on intravenous infusion of dopamine and  given aggressive intravenous fluid hydration.  Clinical response was  satisfactory.  The patient clearly had profound volume depletion, which was  deemed secondary to a combination of poor oral fluid intake, ongoing  diuretic therapy, exacerbated by ACE inhibitor treatment.  By 03/15/2006 the  patient had remained consistently normotensive.  We were able to wean her  off dopamine and manage her with continued intravenous fluids.   Problem #2:  DEHYDRATION/ACUTE RENAL INSUFFICIENCY.  The patient presents  with hypotension.  The BUN was found to be 27, with a creatinine of 1.6,  i.e. consistent with volume depletion and acute renal insufficiency.  She  was managed with aggressive intravenous fluid hydration, in addition to  above-mentioned measures and clinical response was satisfactory.  By  03/15/2006, the patient had become euvolemic clinically, was completely  asymptomatic, and BUN had normalized to 16 with a creatinine of 1.2.  On  03/16/2006 BUN was 11, and creatinine, 1.0.  We were able to discontinue  intravenous fluids and renal indices have remained satisfactory since.  As a  matter of fact, on 03/16/2006 BUN was 8 with a creatinine of 0.9.  Clearly  the patient's diuretic therapy was the culprit for this.  The patient had  been on a combination of Lasix 40 mg and spironolactone 25 mg.  These were  held during this hospitalization, however, on 03/17/2006, as renal indices  had normalized and the patient was euvolemic, diuretics, per cardiologist`s  recommendations, were reinstated in a much lower dose.  Lasix is now 20 mg  p.o. daily.  Spironolactone has been held, until followup and review by the  patient's primary MD.   Problem #3:  PROFOUND SINUS  BRADYCARDIA.  The patient was on no  culprit medications.  This was clearly contributory to the patient's syncopal  episode. The specter of possible sick sinus syndrome, i.e. sino-atrial  disease was considered.  Because of this, cardiology consultation was  called, which was kindly provided by Dr. Valera Castle, who postulated that  the patient may have suffered a vasovagal reaction, or possible transient AV  nodal ischemia.  He recommended doing a 2-D echocardiogram as well as an  adenosine Myoview.  These tests were done on 03/17/2006. For details of 2-D  echocardiogram, refer to procedure list above. Adenosine Myoview showed an  ejection fraction of 61%, mild  anterior apical ischemia.  No further  cardiology workup was recommended.  Medical treatment only, was recommended.  We have, therefore, placed the patient on low-dose Aspirin, as well as low-  dose Imdur.  Clearly beta blocker is not an option, in view of the patient's  bradycardia.  Heart rates have been fairly reasonable, ranging between 55 to  65 per minute. The patient continues on her pre-admission Statins.   Problem #4:  CORONARY ARTERY DISEASE.  Refer to above.   Problem #5:  HYPERTENSION.  Predictably, the patient's antihypertensives  were discontinued during her hospitalization, however, on 03/16/2006 blood  pressure was noted to be significantly elevated at 178/78 mmHg.  The patient  has, therefore, been restarted on Benazepril, in the moderately reduced dose  of 20 mg p.o. daily.  Imdur which has also been started for myocardial  ischemia, should also be helpful. The patient will followup with her primary  MD, Dr. Murray Hodgkins, who we expect to monitor her blood pressure and modify  her treatment as indicated.   DISPOSITION:  The patient Was considered sufficiently clinically recovered  and stable to be discharged on 03/18/2006, provided no acute issues arise.  The patient appears to have regained her pre-admission functional status,  however, for  completeness, PT/OT evaluation has been requested.  This is  expected to take place in the a.m. of 03/18/2006.  Any recommendations made,  will be implemented. The patient is expected to followup routinely with her  primary MD, Dr. Murray Hodgkins, [facility] to continue a healthy  heart diet, and increase activity slowly.      Isidor Holts, M.D.  Electronically Signed     CO/MEDQ  D:  03/17/2006  T:  03/17/2006  Job:  [id number]   cc:   Lenon Curt. Chilton Si, M.D.  Thomas C. Wall, MD, [facility]

## 2010-09-28 NOTE — H&P (Signed)
NAME:  [name] [name], [name] [name]                 ACCOUNT NO.:  [id number]   MEDICAL RECORD NO.:  [id number]          PATIENT TYPE:  IPS   LOCATION:  NA                           FACILITY:  [facility]   PHYSICIAN:  Ellwood Dense, M.D.   DATE OF BIRTH:  1917/11/24   DATE OF ADMISSION:  DATE OF DISCHARGE:                                HISTORY & PHYSICAL   NEUROSURGEON:  Dr. Franky Macho   NEUROLOGIST:  Dr. Nash Shearer   HOSPITALIST:  Dr. __________   PRIMARY CARE PHYSICIAN:  Dr. Murray Hodgkins   HISTORY OF THE PRESENT ILLNESS:  [name] is a 77 year old adult  female with history of hypertension and Parkinson's disease. She was  admitted July 12, 2005, with progressive weakness and numbness from her  bilateral lower extremities and progressing through her bilateral upper  extremities. She also had difficulty walking. Neurology was consulted and  workup showed C3-C4 spondylosis with cord compression and stenosis along  with myelopathy.   The patient was started on steroid protocol and eventually underwent  anterior cervical discectomy and fusion C3-C4 with arthrodesis July 14, 2005, by Dr. Franky Macho. Postoperatively she was noted to have decreased  neuropathy and improvement in her mobility. Reportedly, neurosurgery has  asked the patient to use a soft collar when out of bed and on an as-needed  basis.   The patient was evaluated by the rehabilitation physicians and felt to be an  appropriate candidate for inpatient rehabilitation.   REVIEW OF SYSTEMS:  Positive for cervicalgia, numbness, anxiety, and  insomnia.   PAST MEDICAL HISTORY:  1.  Hypertension.  2.  Congestive heart failure.  3.  Macular degeneration.  4.  Prior cholecystectomy.  5.  Prior hysterectomy.  6.  Dysphagia with prior dilatation.  7.  History of thyrotoxicosis.  8.  Genital herpes.  9.  Right total hip replacement.  10. Prior hernia surgery.  11. Questionable Parkinson's disease.   FAMILY HISTORY:  Positive for  coronary artery disease and cancer.   SOCIAL HISTORY:  The patient presently lives at [facility] in the  independent living section and has been there secondary to problems at her  nursing home where she had been previously. She does not use alcohol or  tobacco. There is a history of drug addiction in the past, according to the  medical records.   FUNCTIONAL HISTORY PRIOR TO ADMISSION:  Independent with a cane prior to  February 2006.   ALLERGIES:  No known drug allergies.   MEDICATIONS PRIOR TO ADMISSION:  1.  AcipHex 30 mg daily.  2.  Acyclovir 200 mg daily.  3.  Celebrex 200 mg daily.  4.  Lotensin 20 mg two tablets daily.  5.  Lexapro 10 mg daily.  6.  Restoril 30 mg q.h.s. p.r.n.  7.  Darvocet p.r.n.   RECENT LABORATORY:  Showed a hemoglobin of 14.7; hematocrit of 42.4;  platelet count of 333,000; and white count of 11.1. A recent EKG July 14, 2005, showed normal sinus rhythm with 67 beats per minute and questionable  septal infarct of unknown age.  Recent sodium was 140, potassium 4.0, chloride 105, CO2 28, BUN 21,  creatinine 0.8, and glucose 100. Recent TSH was measured at 2.319.   PHYSICAL EXAMINATION:  GENERAL:  Well-appearing, frail, elderly female lying  in bed in no acute discomfort. She does not have her cervical collar in  place and keeps her head either neutral or in extended position.  HEENT:  Normocephalic, nontraumatic.  NECK:  Showed well-healed scar with Steri-Strips and sutures just off to the  midline on left.  CARDIOVASCULAR:  Regular rate and rhythm, S1, S2, without murmurs.  ABDOMEN:  Soft, nontender, with positive bowel sounds.  LUNG:  Clear to auscultation bilaterally.  NEUROLOGIC:  Alert and oriented x3. Cranial nerves II-XII are intact.  Bilateral upper extremity exam showed 4+/5 to 5-/5 strength throughout. Bulk  and tone were normal and reflexes were 2+ and symmetrical. Sensation was  intact to light touch throughout the bilateral upper  extremities.   Lower extremity exam showed hip flexion, knee extension, and ankle  dorsiflexion at 4-/5. Sensation was intact to light tough throughout the  bilateral lower extremities and reflexes were 1+ at the bilateral knees and  ankles.   IMPRESSION:  Status post anterior cervical discectomy C3-C4, postoperative  day #4, performed by Dr. Franky Macho.   Presently the patient has deficits in ADLs, transfers and ambulation  secondary to the cervical fusion for cervical spondylosis with myelopathy.   PLAN:  1.  Admit to the rehabilitation unit for daily OT/PT therapies to advance      ADLs, transfers and ambulation.  2.  Twenty-four-hour nursing for medication administration, monitoring of      vital signs, and wound dressing changes as needed.  3.  Check admission labs including CBC and CMET Friday, July 19, 2005.  4.  Continue Lexapro 10 mg daily for depression.  5.  Lovenox 40 mg subcu daily for DVT prophylaxis.  6.  Continue soft cervical collar on when out of bed and on an as-needed      basis.  7.  Continue Flexeril 5 mg t.i.d. p.r.n. for spasms along with oxycodone 5      mg one to two tablets p.o. q.4-6h. p.r.n. for pain relief.  8.  Monitor hypertension on Lotensin 20 mg b.i.d., Lopressor 12.5 mg b.i.d.  9.  Continue AcipHex 30 mg daily for GI prophylaxis.  10. Continue nitroglycerin 0.4 mg sublingual p.r.n. for chest pain.  11. Vitamin C 1000 mg daily for wound healing.  12. Restoril 30 mg q.h.s. p.r.n. for insomnia.   PROGNOSIS:  Good.   ESTIMATED LENGTH OF STAY:  Twelve days.   GOALS:  Modified independent ADLs, transfers, and ambulation.           ______________________________  Ellwood Dense, M.D.     DC/MEDQ  D:  07/18/2005  T:  07/18/2005  Job:  [id number]

## 2010-09-28 NOTE — H&P (Signed)
NAME:  [name] [name], [name] [name]                 ACCOUNT NO.:  [id number]   MEDICAL RECORD NO.:  [id number]          PATIENT TYPE:  EMS   LOCATION:  MAJO                         FACILITY:  [facility]   PHYSICIAN:  [name] [name], M.D.DATE OF BIRTH:  12/20/1917   DATE OF ADMISSION:  03/13/2006  DATE OF DISCHARGE:                                HISTORY & PHYSICAL   PRIMARY CARE PHYSICIAN:  [name] [name], M.D.   CHIEF COMPLAINT:  Passed out this morning.   HISTORY OF PRESENT ILLNESS:  [name] [name] is a pleasant 77 year old Caucasian  female, with poorer than usual state of health until this morning.  She  apparently was seen by [name] [name] in the office this morning for a  scheduled appointment, and the patient had no problems at that time. She was  accompanied by her daughter. The daughter reported that while driving back  to the assisted living facility that the patient complained she was not  feeling well. There was no chest pain, no other symptoms.  No nausea or  vomiting.   On arrival at the [name], the daughter attempted to arouse her mom  and she was unresponsive.  She was breathing and there was no change in her  color. She then went up to the facility staff to request for help.  She was  put in a wheelchair; still the patient was not responding but breathing  okay.  EMS was called.  Prior to EMS arrival, the vitals were checked; it  revealed a heart rate 38, respiratory rate 12, with an oxygen saturation of  88% on room air.  Blood pressure was said to 150/70.   On arrival here in the emergency room, blood pressure initially was 156/51  and heart rate of 44.  Blood pressure subsequently dropped to 85/20, after  one hour of being in the emergency room.  The patient received 1 ampule of  atropine.  Heart rate has been sustained above 40.  She remains asymptomatic  at this point.  She denies any chest pain or dizziness.  The patient  reported that she had some right upper quadrant  pain, which was relieved  with 2 tablets of Tums earlier this morning.  She also reported a left arm  pain, which subsided after she received aspirin in the emergency room. She  has so far denied any chest pain or shortness of breath.   The patient's EKG done at about 1 p.m. showed marked sinus bradycardia with  a heart rate of 40 beats per minute.  No acute ST changes.  Previous EKG in  March 2007 showed normal sinus rhythm with a heart rate of 67 beats per  minute.   REVIEW OF SYSTEMS:  There is no fever, chills, cough, shortness of breath;  no abdominal pain, nausea or vomiting or diarrhea.   PAST MEDICAL HISTORY:  1. Congestive heart failure.  2. Hypertension.  3. Cervical spinal cord injury.  4. Constipation.  5. Hemorrhoids.  6. Herpes simplex.  7. Macular degeneration.  8. Parkinson's disease.  9. History of insomnia.   PAST  SURGICAL HISTORY:  1. Cholecystectomy.  2. Hip replacement.  3. Hysterectomy.  4. Cervical laminectomy.   CURRENT MEDICATIONS:  1. Acyclovir 200 mg daily.  2. Alprazolam 0.25 mg twice a day.  3. Benazepril 20 mg twice a day.  4. Bisacodyl 100 mg twice a day.  5. Calcium 500 mg once a day.  6. Celebrex 200 mg daily.  7. Gabapentin 100 mg three times a day.  8. Lasix 40 mg daily.  9. Lexapro 20 mg daily.  10.Lipitor 10 mg daily.  11.Lovastatin 40 mg daily.  12.Potassium chloride 20 mEq daily.  13.Prilosec 20 mg daily.  14.Spironolactone 25 mg daily.   ALLERGIES:  NO KNOWN DRUG ALLERGIES.   FAMILY HISTORY:  Significant for CAD and stroke.   SOCIAL HISTORY:  The patient is a lifelong nonsmoker, nondrinker.  She lives  in an assisted living facility.   PHYSICAL EXAMINATION:  VITAL SIGNS:  (Current) Temperature 97.0, blood  pressure 93/48, pulse 42, respiratory rate 16, O2 saturations 100%.  GENERAL:  On examination the patient is awake, oriented to time, place and  person.  HEENT:  Normocephalic and atraumatic.  Pupils are equal, round  and reactive  to light.  Extraocular muscle movements intact.  NECK:  No elevated JVD; no carotid bruits.  LUNGS:  Clear clinically to auscultation.  CVS:  S1, S2.  No gallop, no murmur.  ABDOMEN:  Not distended; soft and nontender.  Bowel sounds present.  EXTREMITIES:  No pedal edema.  CNS:  No focal neurological deficit.   INITIAL LABORATORY DATA:  White cell 8.0, hemoglobin 12.2, hematocrit 35.1,  platelets 254.  Monocytes 12, neutrophils 62. Sodium 133, potassium 5.0,  chloride 101, CO2 26, glucose 90, BUN 27, creatinine 1.6.  Alkaline  phosphatase 65.  AST 18, ALT 25, total protein 5.6, albumin 5.3, calcium  8.5.  Cardiac markers:  Troponin less than 0.05.  Urinalysis is cloudy in  appearance, with specific gravity 1.010, large leukocytes and nitrites  negative.  Microscopy showed white blood cells 3-6, rare bacteria.   CHEST X-RAY:  Shows density at the right lung apex, which could represent  sclerosis in the right first rib and scarring or neoplastic pulmonary  nodule.  Follow-up lordotic view of the chest  on CT scan is recommended.   ASSESSMENT AND PLAN:  [name] [name] is a pleasant 77 year old Caucasian female  presenting with bradycardia, hypotension and episode of unresponsiveness.  She was also noted to be hypoxic with O2 saturations of 88% on room air.  She is not on a beta blocker.  Etiology of these symptoms is unclear. She  needs to be admitted to a monitored bed and rule out myocardial infarction,  pulmonary embolism.  This could possibly be sick sinus syndrome.   ADMISSION DIAGNOSES:  1. Sinus bradycardia.  2. Hypotension.  3. Query syncope.  4. Hypoxia.  5. Right upper lobe density noted on chest x-ray.  6. Renal insufficiency, likely secondary to diuretics.  7. Mild pyuria.  8. History of congestive heart failure.  9. History of cervical spinal injury, status post surgery.  PLAN:  Will admit to stepdown unit. Serial cardiac enzymes q.8 x3.  Check D-  dimer  and VQ scan.  Creatinine at this point precludes use of IV contrast.  Will give gentle hydration with normal saline 100 cc/hr x1 liter.  The  patient may need p.r.n. atropine, with or without dopamine; depending on  stability.  Will provide transcutaneous pacer to bedside.  I am holding  anti-  hypertensives, Lasix and spironolactone.  Will obtain urine culture.  We  will resume all other [name] medications.  We will also check BNP and TSH.      [name] B. Corky Downs, M.D.  Electronically Signed     MBB/MEDQ  D:  03/13/2006  T:  03/13/2006  Job:  [id number]   cc:   [name] [name], M.D.

## 2010-09-28 NOTE — Consult Note (Signed)
NAME:  [name] [name]                 ACCOUNT NO.:  [id number]   MEDICAL RECORD NO.:  [id number]          PATIENT TYPE:  INP   LOCATION:  3735                         FACILITY:  [facility]   PHYSICIAN:  Thomas C. Wall, MD, FACCDATE OF BIRTH:  May 08, 1918   DATE OF CONSULTATION:  03/16/2006  DATE OF DISCHARGE:                                   CONSULTATION   REFERRING PHYSICIAN:  Isidor Holts, M.D.   REASON FOR CONSULTATION:  I was asked by Dr. Brien Few to evaluate, [name] [name], a  delightful, 77 year old, white female who was admitted on March 13, 2006,with presyncope, hypotension and bradycardia with heart rate of 38  beats per minute.   She was returning home from the physician with her daughter.  Her daughter  noted that she was not responding well.  The patient noted she had not been  feeling well for several days.   When EMS arrived, her heart rate was 38, respirations 12, O2 saturations 88%  on room air.  In the emergency room here, heart rate  was 44 in sinus  bradycardiac.  Blood pressure was 156/51.  It subsequently dropped to 85/20.  She received one ampule of atropine and heart rate remained above 40.  She  was then placed on IV fluids, diuretics discontinued and she was given IV  dopamine for a short period of time.   Looking back at her EKGs, she had sinus bradycardia with a rate of 40 in the  ER with no evidence of first-degree AV block, QRS was normal with no ST-  segment changes.   Her BUN and creatinine was elevated on admission consistent with possible  dehydration.  BUN specifically was 27 and creatinine 1.6.  After hydration,  her numbers have improved dramatically.  She was on spironolactone and Lasix  prior to admission.   Her cardiac enzymes were negative.  Chest CT was negative for pulmonary  embolus.   MEDICATIONS PRIOR TO ADMISSION:  1. Acyclovir 200 mg a day.  2. Alprazolam 0.25 b.i.d.  3. Benazepril 20 mg b.i.d.  4. Bisacodyl 100 mg b.i.d.  5. Calcium  500 mg a day.  6. Neurontin 100 mg t.i.d.  7. Lasix 40 mg a day.  8. Lexapro 20 mg a day.  9. Lipitor 10 mg nightly.  10.Question being on Lovastatin 40 nightly.  11.Potassium 20 mEq a day.  12.Prilosec 20 mg a day.  13.Spironolactone 25 mg a day.   PAST MEDICAL HISTORY:  1. Hypertension.  2. Macular degeneration.  3. Parkinson's disease.  4. Esophageal reflux disease, status post esophageal dilatation.  5. Genital herpes.  6. Questionable history of heart failure.  7. Cholecystectomy.  8. Right hip replacement.  9. Hysterectomy.  10.Cervical laminectomy.   SOCIAL HISTORY:  She lives at [facility] in assisted-living.  She does not  smoke or drink.  She likes chocolate.Marland Kitchen   FAMILY HISTORY:  Noncontributory.   REVIEW OF SYSTEMS:  Other than the HPI, all 12 systems are negative.  She  denies any exertional chest pain, shortness of breath, orthopnea, PND,  history of  syncope or presyncope in the past.   PHYSICAL EXAMINATION:  VITAL SIGNS:  Her blood pressure is 138/50, her pulse  is 65 and she is in sinus rhythm.  O2 saturations 95% on room air.  Respirations 15, unlabored, temperature is 98.2.  HEENT:  Very pleasant, normocephalic, atraumatic.  PERRL.  Extraocular is  intact.  Sclerae are clear.  Facial symmetry is normal.  NECK:  Carotids  were equal bilateral bruits.  No JVD.  Thyroid is not enlarged.  Trachea is  midline.  LUNGS:  Clear.  HEART:  A soft S1-S2 without murmur, rub or gallop.  ABDOMEN:  Soft, good bowel sounds.  No midline bruit or hepatomegaly.  EXTREMITIES:  No edema.  Pulses are intact.  NEUROLOGIC:  Intact.   LABORATORY DATA AND X-RAY FINDINGS:  Her labs today show a BUN of 11,  creatinine 1.0, potassium 4.3, sodium 138, normal blood sugars.   ASSESSMENT:  1. Presyncope associated with profound bradycardia and hypotension.  This      is in the setting of dehydration which certainly could have      contributed.  Some of this may have been a  vasovagal reaction.      Inferior wall or ischemia needs to be ruled out.  2. History of hypertension.  3. Dehydration, now resolved.   RECOMMENDATIONS:  1. Decrease dose of diuretics.  I would probably discharge her off the      spironolactone and keep her on Lasix 20 mg a day.  Would also consider      decreasing her dose of ACE inhibitor that is running her blood pressure      a little bit higher than normal.  2. A 2-D echocardiogram to assess LV function and right-sided pressures.  3. Adenosine Myoview to rule out obstructive coronary disease.   Thank you very much for the consultation.      Thomas C. Daleen Squibb, MD, [facility]  Electronically Signed     TCW/MEDQ  D:  03/16/2006  T:  03/17/2006  Job:  [id number]   cc:   Lenon Curt. Chilton Si, M.D.

## 2010-09-28 NOTE — H&P (Signed)
NAME:  [name] [name], [name] [name]                 ACCOUNT NO.:  [id number]   MEDICAL RECORD NO.:  [id number]          PATIENT TYPE:  INP   LOCATION:  0156                         FACILITY:  [facility]   PHYSICIAN:  Isidor Holts, M.D.  DATE OF BIRTH:  1917/12/10   DATE OF ADMISSION:  07/12/2005  DATE OF DISCHARGE:                                HISTORY & PHYSICAL   PRIMARY MEDICAL DOCTOR:  Lenon Curt. Green, M.D.   CHIEF COMPLAINT:  Weakness both upper and lower extremities for  approximately one week.   HISTORY OF PRESENT ILLNESS:  This is an 77 year old female. For past medical  history, see below. Resident of [facility]  Approximately three weeks ago, the patient had an episode of  gastroenteritis, presumed viral in etiology.  This lasted approximately five  days, then relapsed for one week, after an interlude of 2-3 days.  Gastroenteritis symptoms have since resolved completely.  About 10 days ago,  the patient noted tingling/numbness both lower extremities, followed a few  days later by similar symptoms and pain both arms.  The arms subsequently  became weak.  She went to see her PMD, on June 18, 2005, and at that time  was still able to walk.  Following M.D. visit, found later that evening that  she was now unable to walk.  She was transferred initially to the assisted  living facility and then subsequently, on July 09, 2005, to nursing  care.  She was seen again by her PMD, on July 11, 2005, and referred to the  emergency department today.  Denies fever.  Denies dizziness .  Denies  visual obscurations.  Denies dysarthria or dysphagia.   PAST MEDICAL HISTORY:  1.  Hypertension.  2.  Parkinsonism.  3.  Osteoarthritis/osteoporosis/kyphosis.  4.  History of spinal stenosis.  5.  Dysthyroidism/thyroid toxicosis.  6.  Depression.  7.  Status post hysterectomy, 1961.  8.  Status post cholecystectomy, 1993.  9.  Status post cataract surgery, 1994 and 1995.  10. Status post hip replacement, 1003.  11. Dyslipidemia.  12. CHF.  13. Dysphagia, status post EGD/Dilatation, Oct 05, 2003, by Dr. Georgiana Spinner, gastroenterologist.  14. Internal hemorrhoids.  15. Genital herpes.   MEDICATIONS:  1.  Lexapro 10 mg p.o. daily.  2.  Vitamin C 500 mg two pills p.o. q.a.m.  3.  Centrum Silver one pill p.o. daily.  4.  Calcium with vitamin D 1,200 mg p.o. daily.  5.  Celebrex 200 mg p.o. daily.  6.  Acyclovir 200 mg p.o. daily.  7.  Aciphex 20 mg p.o. daily p.r.n.  8.  Lotensin 40 mg p.o. daily.  9.  Enulose two tablets p.o. daily.  10. NTG 0.4 mg SL p.r.n.  11. Flonase nasal spray p.r.n.  12. Tylenol p.r.n.   ALLERGIES:  No known drug allergies.   SYSTEMS REVIEW:  As per HPI and chief complaint, otherwise negative.   SOCIAL HISTORY:  The patient is a resident of [facility], and was there till July 08, 2005.  She is a nonsmoker,  nondrinker, has no history of drug abuse.  She was fully ambulant.  She has  four offspring, had eight siblings.  One sister died of ovarian cancer.  One  brother died of esophageal cancer and yet another brother of a heart attack.   FAMILY HISTORY:  Otherwise noncontributory.   PHYSICAL EXAMINATION:  VITAL SIGNS:  Temperature 97.4, pulse 102 per minute,  regular, respiratory rate 20, BP 157/78-mmHg, pulse oximetry 97% on room  air.  GENERAL:  The patient is alert, oriented, communicative, not short of breath  at rest.  HEENT:  No clinical pallor.  No jaundice.  No conjunctival injection.  Throat appears quite clear.  NECK:  Supple.  JVP not seen.  No palpable lymphadenopathy.  No palpable  goiter.  CHEST:  Clinically clear to auscultation.  No wheezes, no crackles.  HEART:  Sounds one and two heard, normal, regular, no murmurs.  ABDOMEN:  Full, soft, and nontender.  There is no palpable organomegaly, no  palpable masses.  Normal bowel sounds.  EXTREMITIES:  Lower extremity exam  showed no pitting edema.  Palpable  peripheral pulses.  MUSCULOSKELETAL:  Unremarkable apart from changes of nodular osteoarthritis.  CNS:  No cranial nerve deficit.  Power is 3/5 both upper extremities,  although somewhat stronger on the right.  Power is 3/5 both lower  extremities.  The patient has blunting of touch and pinprick sensation  bilaterally, with sensory level at the upper chest level.  Tendon reflexes  are brisk in both biceps and knees, while triceps and ankle jerks are  diminished.  Plantar responses are upgoing bilaterally.   INVESTIGATIONS:  Currently pending.   ASSESSMENT/PLAN:  1.  Recent gastroenteritis. Resolved, query etiology - presumed viral.  The      patient has been asymptomatic for approximately one week.  2.  Acute quadriparesis. Query etiology.  Differential diagnosis is wide,      including transverse myelitis.  The patient does appear to have a      sensory level.  Also Guillain-Barre syndrome, given recent      gastroenteritis, although tendon reflexes appear brisk.  Demyelination      is another possibility and less likely, cord infarction, spondylotic      myelopathy, vasculitis.  We will admit the patient to stepdown unit for      close monitoring, do regular neurologic checks, do brain, cervical spine      and thoracic spine MRI, ESR, ANA, antiDsDNA, CBC, CMP, B-12, and folate.      We shall request neurologic consultation.  It is possible that the      patient may require steroid treatment versus gamma globulin .  We shall      defer to neurology on this.  She will certainly need physical therapy,      occupational therapy input.   1.  Hypertension.  This is uncontrolled.  We shall continue pre-admission      antihypertensives and monitor.   1.  Osteoarthritis.  We will utilize as needed analgesics.   1.  Parkinsonism.  No overt clinical features discernible.   1.  Dysthyroidism.  We shall check TSH.  1.  Depression. Query.  Mood appears  stable.  Lexapro was just started on      July 08, 2005.  We shall hold this for now.  Further management will      depend on clinical course.      Isidor Holts, M.D.  Electronically Signed  CO/MEDQ  D:  07/12/2005  T:  07/12/2005  Job:  [id number]   cc:   Lenon Curt. Chilton Si, M.D.  Fax: [phone]

## 2010-09-28 NOTE — Op Note (Signed)
NAME:  [name] [name], [name] [name]                 ACCOUNT NO.:  [id number]   MEDICAL RECORD NO.:  [id number]          PATIENT TYPE:  INP   LOCATION:  3106                         FACILITY:  [facility]   PHYSICIAN:  Coletta Memos, M.D.     DATE OF BIRTH:  [birth year]   DATE OF PROCEDURE:  07/14/2005  DATE OF DISCHARGE:  07/13/2005                                 OPERATIVE REPORT   PREOPERATIVE DIAGNOSES:  1.  Cervical stenosis.  2.  Cervical spondylosis with myelopathy, C3-4.  3.  Cervical degenerative disk disease.   POSTOPERATIVE DIAGNOSES:  1.  Cervical stenosis.  2.  Cervical spondylosis with myelopathy, C3-4.  3.  Cervical degenerative disk disease.   PROCEDURE:  Anterior cervical decompression C3-4, arthrodesis C3-4 with  morselized allograft, DBX bone putty and a 7 mm Peak Synthes implant and  anterior plating 14 mm Synthes Vector plate.   COMPLICATIONS:  None.   SURGEON:  Cabbell.   INDICATIONS:  [name] [name] is a 77 year old woman who presented to hospital  of generalized weakness of the upper and lower extremities.  This had bone  on for well over a month but it had gotten progressively worse over the week  prior to her admission. MRI showed cord compression at C3-4 with altered  cord signal. I was consulted and recommended operative decompression. I  spoke with the family at length.  They agreed.   OPERATIVE NOTE:  [name] [name] was brought to the operating room intubated and  placed under general anesthetic without difficulty. Her neck was prepped and  she was draped in sterile fashion. She was positioned on a horseshoe head  rest with her head essentially in neutral position. I infiltrated 3 mL 0.5%  lidocaine 1:200,000 epinephrine into the cervical region. I then opened the  skin with a #10 blade took this down to the platysma sharply. I dissected  rostrally and caudally at the skin level. The platysma was quite atrophied.  I opened the soft tissue in the platysmal plain horizontally.  I then  identified the medial strap muscles and the sternocleidomastoid. I retracted  that medially, identified the carotid artery and retracted that laterally. I  then placed a spinal needle into the disk space and x-ray showed that I was  at C3-4. I then reflected the longus colli muscles bilaterally. I placed a  self-retaining retractor. I then placed two distraction pins one in C3 the  other in C4. I opened the disk space with a #15 blade and removed disk  material with pituitary rongeurs and the Kerrison punch. I used curettes  also to scrape down the endplates and prepared them for arthrodesis. I  removed disk until I got to the posterior longitudinal ligament. I then  opened that and fully decompressed the spinal canal at the C3-4 level. I did  not do any aggressive foraminotomies as that was not really the problem. I  then irrigated the wound. I then placed a 7 mm Synthes cage after sizing the  disk space.  That was filled with DBX bone putty. That was placed without  difficulty. I  then removed the distraction pins. I then placed a 14 mm  anterior plate ACCS into the plate. Two screws in C3 the other two screws in  C4. X-ray showed the  plate and screws and plug were all in good position. The screws were self-  tapping and a drill hole was placed at each level. I closed the wound in  layered fashion using Vicryl sutures to reapproximate the platysma layer and  then subcuticular layer. Dermabond was used for sterile dressing. The  patient tolerated procedure well.           ______________________________  Coletta Memos, M.D.     KC/MEDQ  D:  07/14/2005  T:  07/15/2005  Job:  [id number]

## 2010-09-28 NOTE — Op Note (Signed)
   NAME:  [name] [name], [name] [name]                           ACCOUNT NO.:  [id number]   MEDICAL RECORD NO.:  [id number]                   PATIENT TYPE:  AMB   LOCATION:  ENDO                                 FACILITY:  [facility]   PHYSICIAN:  Georgiana Spinner, M.D.                 DATE OF BIRTH:  Sep 17, 1917   DATE OF PROCEDURE:  DATE OF DISCHARGE:                                 OPERATIVE REPORT   PROCEDURE:  Upper endoscopy with dilation and biopsy.   INDICATIONS:  Dysphagia.   ANESTHESIA:  Demerol 50 mg, Versed 6 mg.   DESCRIPTION OF PROCEDURE:  With the patient mildly sedated in the left  lateral decubitus position, the Olympus videoscopic endoscope was inserted  in the mouth and passed under direct vision through the esophagus, which  appeared grossly normal except for question of Barrett's esophagus, which we  saw later on actually.  We then entered into the stomach.  The fundus, body,  antrum, duodenal bulb and second portion of the duodenum were visualized.  From this point, the endoscope was slowly withdrawn taking circumferential  views of the duodenal mucosa until the endoscope then pulled back into the  stomach, placed in retroflexion and viewed the stomach from below.  The  endoscope was then straightened and the guide wire was passed.  The  endoscope was withdrawn, taking circumferential views of the remaining  gastric and esophageal mucosa.  Subsequently Savary dilators 14, 16 and 17  were passed rather easily with only minimal resistance with the latter two.  No blood was seen on either.  The endoscope was then reinserted as the guide  wire was removed with the last dilator.  No bleeding was seen.  The  endoscope was advanced into the stomach and withdrawn, taking  circumferential views of the remaining gastric and esophageal mucosa,  stopping only in the distal esophagus where there appeared to be a short  segment of Barrett's esophagus that was biopsied.  The patient's vital signs  and pulse oximetry remained stable.  The patient tolerated the procedure  well without apparent complication.   FINDINGS:  1. Question of Barrett's esophagus, biopsied.  2. Dilation of distal esophagus with 14, 16 and 17 Savary dilators.   PLAN:  Await clinical response and have the patient call me for results of  biopsy and followup with me as an outpatient.                                                Georgiana Spinner, M.D.    GMO/MEDQ  D:  03/09/2003  T:  03/09/2003  Job:  [id number]

## 2010-09-28 NOTE — Consult Note (Signed)
NAME:  [name], [name]                 ACCOUNT NO.:  [id number]   MEDICAL RECORD NO.:  [id number]          PATIENT TYPE:  INP   LOCATION:  0156                         FACILITY:  [facility]   PHYSICIAN:  Casimiro Needle L. Reynolds, M.D.DATE OF BIRTH:  [birth year]   DATE OF CONSULTATION:  07/12/2005  DATE OF DISCHARGE:                                   CONSULTATION   REQUESTING PHYSICIAN:  Dr. Cristal Deer Oti   REASON FOR EVALUATION:  Quadriparesis.   HISTORY OF PRESENT ILLNESS:  This is the initial inpatient consult visit for  this 77 year old woman, who comes to the emergency room today with a 1 week  history of progressive quadriparesis.  The patient stated that she has had a  couple of rounds of GI illness over the past 2-3 weeks with nausea,  vomiting, and diarrhea.  However, this had gradually gotten better over the  past several days, but she has noticed along with this, it progressed to  weakness of the arms and legs.  She noted numbness and tingling beginning in  both lower extremities followed a few days later by painful paresthesias in  the upper extremities.  She then began developing progressive lower  extremity weakness and has not really been able to walk for about the past 4  days.  She resides at [facility] in the independent living area, was  transferred to assisted living and then to nursing care yesterday.  She was  seen by the physician at [facility] there yesterday and brought to the  emergency room today for further evaluation.  The patient, on specific  questioning, denies any difficulty with chewing or swallowing problems and  denies having any difficulty with diplopia or blurred vision or other visual  changes.  The patient has seen a neurosurgeon in the past, Dr. Ala Dach, in [facility], for cervical radiculopathy symptoms, but she has never had a known  cervical spinal stenosis.   PAST MEDICAL HISTORY:  1.  Hypertension.  2.  There was also a mention of Parkinson's on  her chart, although I do not      find any further information about that, and she is on no medications      for that.  3.  She has history of hypothyroidism.  4.  Recently diagnosed with depression and started on Lexapro.   Family/social/review of systems as outlined in admission H&P done earlier  today by Dr. Brien Few.   MEDICATIONS:  1.  Lexapro 10 mg daily.  2.  Vitamin C.  3.  Multivitamin.  4.  Calcium with vitamin D.  5.  Celebrex 200 mg daily.  6.  Acyclovir, Aciphex, Lotensin, Enulose.  7.  P.R.N. Tylenol, Flonase, nitroglycerin.   PHYSICAL EXAMINATION:  VITAL SIGNS:  Temperature 97.4, blood pressure  157/78, pulse 102, respirations 20, O2 saturation 97% on room air.  GENERAL EXAMINATION:  This is a healthy-appearing woman seated in no evident  distress.  HEAD:  Cranium normocephalic, atraumatic.  Oropharynx is benign.  NECK:  Supple without carotid bruits.  HEART:  Regular rate and rhythm without murmurs.  NEUROLOGIC:  Mental status:  She is awake, alert, fully oriented. Speech is  fluent and not dysarthric.  Mood is euthymic and affect appropriate.  Cranial nerves:  Pupils are equal and reactive.  Extraocular movements full  without nystagmus.  Visual Pherigo full to confrontation.  Face, tongue, and  palate move normally and symmetrically, and there is no evidence of fascial  weakness.  Motor:  Normal bulk.  There is some increased tone in the lower  extremities.  She has less than antigravity strength of the proximal muscles  of the upper extremities and just antigravity strength in the distal upper  extremities.  In the lower extremities, she has about 4+/5 strength with  weakness in a pyramidal distribution.  Sensation:  She reports diminished  light touch sensation over the hands and upper trunk, a little bit better  over the higher cervical spine areas.  The patient is a little bit better to  light touch in the lower extremities but still diminished somewhat  distally.  Reflexes 2+ lower extremities, 3+ lower extremities; toes are upgoing  bilaterally.   LABORATORY REVIEW:  Labs before in the emergency room are reviewed.  She had  a negative urinalysis, normal chemistries, minimally elevated white count of  11.1 with otherwise normal CBC.  Sed rate normal at 15.  I personally  reviewed the MRI of her brain which shows age-appropriate atrophy but no  acute findings of the cervical spine which demonstrates a clear spondylitic  cord compression with altered cord signal at C3-4.  The thoracic cord  appears unremarkable.   IMPRESSION:  Cervical myelopathy secondary to progressive degenerative disk  and spondylitic disease at C3-4 with resultant quadriparesis.   RECOMMENDATIONS:  I would agree with steroids as a temporizing measure.  She  will need a neurosurgical evaluation.  I have called Dr. Franky Macho, [facility]  Neurosurgery for consultation.  He will be seeing the patient.  I have  discussed at some length the situation with the patient and her son as well  as the indications for surgery and the potential course of the disease both  with and without surgical intervention.  Thank you for consultation.      Michael L. Thad Ranger, M.D.  Electronically Signed     MLR/MEDQ  D:  07/12/2005  T:  07/13/2005  Job:  [id number]

## 2010-09-28 NOTE — Discharge Summary (Signed)
NAME:  [name] [name], [name] [name]                 ACCOUNT NO.:  [id number]   MEDICAL RECORD NO.:  [id number]          PATIENT TYPE:  INP   LOCATION:  3012                         FACILITY:  [facility]   PHYSICIAN:  Lonia Blood, M.D.      DATE OF BIRTH:  [birth year]   DATE OF ADMISSION:  07/13/2005  DATE OF DISCHARGE:  07/18/2005                                 DISCHARGE SUMMARY   PRIMARY CARE PHYSICIAN:  Lenon Curt. Chilton Si, M.D.   DISCHARGE DIAGNOSES:  1.  Acute cervical spine cord compression, status post anterior cervical      decompression surgery by Coletta Memos, M.D., on July 14, 2005.  2.  Cervical spondylosis with myelopathy as well as cervical stenosis.  3.  Hypertension.  4.  Constipation.  5.  Parkinsonism.  6.  Dysthyroidism.  7.  Depression.  8.  Mild dysphagia.  9.  Stable congestive heart failure.   DISCHARGE MEDICATIONS:  1.  Lexapro 10 mg daily.  2.  Senokot S one tablet daily.  3.  Protonix 40 mg daily.  4.  Multivitamin one tablet daily.  5.  Calcium with vitamin D as Os-Cal 500 mg one tablet daily.  6.  Vitamin C 500 mg daily.  7.  Lopressor 12.5 mg b.i.d.  8.  Dulcolax 10 mg b.i.d.  9.  Lotensin 20 mg b.i.d.  10. Oxycodone 5 mg q.4h. p.r.n.  11. Sublingual nitroglycerin 0.4 mg every five minutes p.r.n.  12. Valium 2 mg q.6h. p.r.n.   DISPOSITION:  The patient is scheduled to be discharged to the rehab unit  here in the hospital.  She has shown some remarkable but slow improvement  since her surgery.  She will follow up with the rehab medicine doctor, but  once her rehab is completed she will be discharged home.   PROCEDURES PERFORMED:  1.  Chest x-ray performed on July 12, 2005, showed no active cardiopulmonary      disease.  There was a right apical scar.  2.  MRI of the T-spine showed scattered mild degenerative changes without      significant thoracic cord compression.  3.  MRI of the C-spine showed multifactorial C3-4 moderate to marked spinal      stenosis and cord  compression.  Please refer to MRI report for full      report.  4.  MRI of the brain without contrast also on July 13, 2005, showed no acute      infarct.  There was atrophy with moderate to marked small vessel disease-      type changes, also opacification of the left mastoid air cells.  5.  Spinal x-ray on July 14, 2005, showed operative scar along the anterior      surface of the neck and upwards at the level of C5.  6.  Another x-ray on the same day showed expected postoperative appearance      of anterior cervical spine fusion at C3-4 with normal alignment.  7.  Another portable x-ray on July 14, 2005, shows localization of the      lesion at  C3-4.  8.  ACDF performed by Coletta Memos, M.D., on July 14, 2005.   CONSULTATIONS:  1.  Neurology, Casimiro Needle L. Thad Ranger, M.D.  2.  Coletta Memos, M.D., neurosurgery.   BRIEF HISTORY AND PHYSICAL:  [name] [name] is an 77 year old Caucasian female  that presented with generalized weakness of the upper and lower extremities.  This was said to be progressive over the previous month but was getting  worse within the week prior to admission.  In the ED she was found to have  numbness of her extremities as well as quadriparesis.  She also was noted to  have decreased reflexes at the same time.  Initial evaluation with the films  and MRI confirmed cord compression in the C3-4 level.  Subsequently  neurosurgery was consulted and the patient was admitted for further  management.   HOSPITAL COURSE:  Problem 1.  SPINAL STENOSIS AND CORD COMPRESSION:  The patient had both  neurology and neurosurgery consult as indicated.  She was seen by Dr. Coletta Memos and surgical decompression was planned.  She was initially admitted  to [facility] but transferred to [facility] so she could get  neurosurgical evaluation and treatment.  The patient was admitted to neuro  ICU as she had anterior cervical decompression surgery performed on July 14, 2005.   Since then she has improved her function.  Her fine motor functions  have improved.  She is still not back to her baseline, but there has been  tremendous improvement over her initial arrival.  She had IV Decadron  started initially prior to surgery to help with decompression.  She is now  able to continue with some PT/OT and then go into rehab.  Hopefully, she  will gain her strength back.   Problem 2.  HYPERTENSION:  Her blood pressure was very much elevated.  It  was not optimal in the beginning.  With work with her blood pressure  medications and further titration has brought down her blood pressure under  control.   Problem 3.  DEPRESSION:  The patient continues to be on Lexapro.   Problem 4.  PARKINSONISM:  This is chronic in the patient, and there has  been no acute change in this hospitalization.   Problem 5.  CASE OF GENITAL HERPES:  The patient is currently on suppressive  therapy and that has not changed.   Problem 6.  DYSLIPIDEMIA:  This is also stable.   Problem 7.  CONSTIPATION:  Postoperatively the patient has been on some pain  medicines.  She has developed some constipation, which has since resolved  with a combination of laxatives.  Also, given her a single dose administered  of magnesium citrate of 150 mL.   DISCHARGE LABORATORY DATA:  Labs today:  Sodium 139, potassium 3.5, chloride  102, CO2 30, BUN 29, creatinine 0.8, glucose 99, calcium 8.1.      Lonia Blood, M.D.  Electronically Signed     LG/MEDQ  D:  07/18/2005  T:  07/19/2005  Job:  [id number]   cc:   Lenon Curt. Chilton Si, M.D.  Fax: [phone]   Marolyn Hammock. Thad Ranger, M.D.  Fax: [phone]   Coletta Memos, M.D.  Fax: [phone]

## 2010-10-12 ENCOUNTER — Emergency Department (HOSPITAL_COMMUNITY): Payer: Medicare Other

## 2010-10-12 ENCOUNTER — Emergency Department (HOSPITAL_COMMUNITY)
Admission: EM | Admit: 2010-10-12 | Discharge: 2010-10-13 | Disposition: A | Discharge status: Home / Self Care | Payer: Medicare Other | Attending: Emergency Medicine | Admitting: Emergency Medicine

## 2010-10-12 DIAGNOSIS — S61409A Unspecified open wound of unspecified hand, initial encounter: Secondary | ICD-10-CM | POA: Insufficient documentation

## 2010-10-12 DIAGNOSIS — Z7982 Long term (current) use of aspirin: Secondary | ICD-10-CM | POA: Insufficient documentation

## 2010-10-12 DIAGNOSIS — I10 Essential (primary) hypertension: Secondary | ICD-10-CM | POA: Insufficient documentation

## 2010-10-12 DIAGNOSIS — S0990XA Unspecified injury of head, initial encounter: Secondary | ICD-10-CM | POA: Insufficient documentation

## 2010-10-12 DIAGNOSIS — Y9210 Unspecified residential institution as the place of occurrence of the external cause: Secondary | ICD-10-CM | POA: Insufficient documentation

## 2010-10-12 DIAGNOSIS — W010XXA Fall on same level from slipping, tripping and stumbling without subsequent striking against object, initial encounter: Secondary | ICD-10-CM | POA: Insufficient documentation

## 2010-10-12 DIAGNOSIS — Y998 Other external cause status: Secondary | ICD-10-CM | POA: Insufficient documentation

## 2010-10-12 DIAGNOSIS — K5900 Constipation, unspecified: Secondary | ICD-10-CM | POA: Insufficient documentation

## 2010-10-12 DIAGNOSIS — G4700 Insomnia, unspecified: Secondary | ICD-10-CM | POA: Insufficient documentation

## 2010-10-12 DIAGNOSIS — IMO0002 Reserved for concepts with insufficient information to code with codable children: Secondary | ICD-10-CM | POA: Insufficient documentation

## 2010-10-12 DIAGNOSIS — Z79899 Other long term (current) drug therapy: Secondary | ICD-10-CM | POA: Insufficient documentation

## 2010-10-12 DIAGNOSIS — F3289 Other specified depressive episodes: Secondary | ICD-10-CM | POA: Insufficient documentation

## 2010-10-12 DIAGNOSIS — F0390 Unspecified dementia without behavioral disturbance: Secondary | ICD-10-CM | POA: Insufficient documentation

## 2010-10-12 DIAGNOSIS — F329 Major depressive disorder, single episode, unspecified: Secondary | ICD-10-CM | POA: Insufficient documentation

## 2010-10-12 DIAGNOSIS — A6000 Herpesviral infection of urogenital system, unspecified: Secondary | ICD-10-CM | POA: Insufficient documentation

## 2010-10-15 ENCOUNTER — Other Ambulatory Visit: Payer: Self-pay | Admitting: Internal Medicine

## 2010-10-15 DIAGNOSIS — M25551 Pain in right hip: Secondary | ICD-10-CM

## 2010-10-16 ENCOUNTER — Ambulatory Visit
Admission: RE | Admit: 2010-10-16 | Discharge: 2010-10-16 | Disposition: A | Discharge status: Home / Self Care | Payer: Medicare Other | Source: Ambulatory Visit | Attending: Internal Medicine | Admitting: Internal Medicine

## 2010-10-16 DIAGNOSIS — M25552 Pain in left hip: Secondary | ICD-10-CM

## 2010-10-16 DIAGNOSIS — M25551 Pain in right hip: Secondary | ICD-10-CM

## 2011-02-20 LAB — URINALYSIS, ROUTINE W REFLEX MICROSCOPIC
Glucose, UA: NEGATIVE
Hgb urine dipstick: NEGATIVE
Specific Gravity, Urine: 1.009
pH: 7.5

## 2011-02-20 LAB — I-STAT 8, (EC8 V) (CONVERTED LAB)
BUN: 15
Bicarbonate: 31 — ABNORMAL HIGH
Chloride: 106
Glucose, Bld: 89
HCT: 44
Hemoglobin: 15
Operator id: 285491
pCO2, Ven: 55.3 — ABNORMAL HIGH

## 2011-02-20 LAB — PROTIME-INR
INR: 0.9
Prothrombin Time: 12.7

## 2011-11-01 ENCOUNTER — Emergency Department (HOSPITAL_COMMUNITY): Payer: Medicare Other

## 2011-11-01 ENCOUNTER — Encounter (HOSPITAL_COMMUNITY): Payer: Self-pay | Admitting: *Deleted

## 2011-11-01 ENCOUNTER — Inpatient Hospital Stay (HOSPITAL_COMMUNITY)
Admission: EM | Admit: 2011-11-01 | Discharge: 2011-11-06 | DRG: 682 | Disposition: A | Discharge status: Skilled Nursing Facility | Payer: Medicare Other | Attending: Family Medicine | Admitting: Family Medicine

## 2011-11-01 DIAGNOSIS — R55 Syncope and collapse: Secondary | ICD-10-CM | POA: Insufficient documentation

## 2011-11-01 DIAGNOSIS — I959 Hypotension, unspecified: Secondary | ICD-10-CM | POA: Diagnosis not present

## 2011-11-01 DIAGNOSIS — F419 Anxiety disorder, unspecified: Secondary | ICD-10-CM | POA: Diagnosis present

## 2011-11-01 DIAGNOSIS — E785 Hyperlipidemia, unspecified: Secondary | ICD-10-CM | POA: Diagnosis present

## 2011-11-01 DIAGNOSIS — I509 Heart failure, unspecified: Secondary | ICD-10-CM | POA: Diagnosis present

## 2011-11-01 DIAGNOSIS — N182 Chronic kidney disease, stage 2 (mild): Secondary | ICD-10-CM | POA: Diagnosis present

## 2011-11-01 DIAGNOSIS — R4182 Altered mental status, unspecified: Secondary | ICD-10-CM | POA: Diagnosis present

## 2011-11-01 DIAGNOSIS — L02619 Cutaneous abscess of unspecified foot: Secondary | ICD-10-CM

## 2011-11-01 DIAGNOSIS — G20 Parkinson's disease: Secondary | ICD-10-CM | POA: Diagnosis present

## 2011-11-01 DIAGNOSIS — I2510 Atherosclerotic heart disease of native coronary artery without angina pectoris: Secondary | ICD-10-CM | POA: Diagnosis present

## 2011-11-01 DIAGNOSIS — F341 Dysthymic disorder: Secondary | ICD-10-CM | POA: Diagnosis present

## 2011-11-01 DIAGNOSIS — L03119 Cellulitis of unspecified part of limb: Secondary | ICD-10-CM | POA: Diagnosis present

## 2011-11-01 DIAGNOSIS — G629 Polyneuropathy, unspecified: Secondary | ICD-10-CM | POA: Insufficient documentation

## 2011-11-01 DIAGNOSIS — L02419 Cutaneous abscess of limb, unspecified: Secondary | ICD-10-CM | POA: Diagnosis present

## 2011-11-01 DIAGNOSIS — J4489 Other specified chronic obstructive pulmonary disease: Secondary | ICD-10-CM | POA: Diagnosis present

## 2011-11-01 DIAGNOSIS — R197 Diarrhea, unspecified: Secondary | ICD-10-CM

## 2011-11-01 DIAGNOSIS — F329 Major depressive disorder, single episode, unspecified: Secondary | ICD-10-CM | POA: Diagnosis present

## 2011-11-01 DIAGNOSIS — L03039 Cellulitis of unspecified toe: Secondary | ICD-10-CM

## 2011-11-01 DIAGNOSIS — K219 Gastro-esophageal reflux disease without esophagitis: Secondary | ICD-10-CM | POA: Diagnosis present

## 2011-11-01 DIAGNOSIS — F32A Depression, unspecified: Secondary | ICD-10-CM | POA: Diagnosis present

## 2011-11-01 DIAGNOSIS — F0390 Unspecified dementia without behavioral disturbance: Secondary | ICD-10-CM | POA: Diagnosis present

## 2011-11-01 DIAGNOSIS — J449 Chronic obstructive pulmonary disease, unspecified: Secondary | ICD-10-CM | POA: Diagnosis present

## 2011-11-01 DIAGNOSIS — L0390 Cellulitis, unspecified: Secondary | ICD-10-CM | POA: Diagnosis present

## 2011-11-01 DIAGNOSIS — G9340 Encephalopathy, unspecified: Secondary | ICD-10-CM | POA: Diagnosis present

## 2011-11-01 DIAGNOSIS — D649 Anemia, unspecified: Secondary | ICD-10-CM | POA: Diagnosis present

## 2011-11-01 DIAGNOSIS — T40601A Poisoning by unspecified narcotics, accidental (unintentional), initial encounter: Secondary | ICD-10-CM

## 2011-11-01 DIAGNOSIS — N189 Chronic kidney disease, unspecified: Secondary | ICD-10-CM | POA: Diagnosis present

## 2011-11-01 DIAGNOSIS — G8929 Other chronic pain: Secondary | ICD-10-CM

## 2011-11-01 DIAGNOSIS — N179 Acute kidney failure, unspecified: Secondary | ICD-10-CM

## 2011-11-01 DIAGNOSIS — M810 Age-related osteoporosis without current pathological fracture: Secondary | ICD-10-CM | POA: Insufficient documentation

## 2011-11-01 DIAGNOSIS — G20A1 Parkinson's disease without dyskinesia, without mention of fluctuations: Secondary | ICD-10-CM | POA: Diagnosis present

## 2011-11-01 DIAGNOSIS — I129 Hypertensive chronic kidney disease with stage 1 through stage 4 chronic kidney disease, or unspecified chronic kidney disease: Secondary | ICD-10-CM | POA: Diagnosis present

## 2011-11-01 HISTORY — DX: Polyneuropathy, unspecified: G62.9

## 2011-11-01 HISTORY — DX: Depression, unspecified: F32.A

## 2011-11-01 HISTORY — DX: Parkinson's disease without dyskinesia, without mention of fluctuations: G20.A1

## 2011-11-01 HISTORY — DX: Disorder of thyroid, unspecified: E07.9

## 2011-11-01 HISTORY — DX: Unspecified dementia, unspecified severity, without behavioral disturbance, psychotic disturbance, mood disturbance, and anxiety: F03.90

## 2011-11-01 HISTORY — DX: Parkinson's disease: G20

## 2011-11-01 HISTORY — DX: Syncope and collapse: R55

## 2011-11-01 HISTORY — DX: Chronic obstructive pulmonary disease, unspecified: J44.9

## 2011-11-01 HISTORY — DX: Age-related osteoporosis without current pathological fracture: M81.0

## 2011-11-01 HISTORY — DX: Essential (primary) hypertension: I10

## 2011-11-01 HISTORY — DX: Anxiety disorder, unspecified: F41.9

## 2011-11-01 HISTORY — DX: Anemia, unspecified: D64.9

## 2011-11-01 HISTORY — DX: Other chronic pain: G89.29

## 2011-11-01 HISTORY — DX: Disorder of kidney and ureter, unspecified: N28.9

## 2011-11-01 HISTORY — DX: Hyperlipidemia, unspecified: E78.5

## 2011-11-01 HISTORY — DX: Gastro-esophageal reflux disease without esophagitis: K21.9

## 2011-11-01 HISTORY — DX: Atherosclerotic heart disease of native coronary artery without angina pectoris: I25.10

## 2011-11-01 HISTORY — DX: Major depressive disorder, single episode, unspecified: F32.9

## 2011-11-01 LAB — CBC
HCT: 44.4 % (ref 36.0–46.0)
Hemoglobin: 14.7 g/dL (ref 12.0–15.0)
MCV: 95.9 fL (ref 78.0–100.0)
Platelets: 278 10*3/uL (ref 150–400)
RBC: 4.52 MIL/uL (ref 3.87–5.11)
RBC: 4.15 MIL/uL (ref 3.87–5.11)
RDW: 12.7 % (ref 11.5–15.5)
WBC: 10.3 10*3/uL (ref 4.0–10.5)
WBC: 11.2 10*3/uL — ABNORMAL HIGH (ref 4.0–10.5)

## 2011-11-01 LAB — DIFFERENTIAL
Basophils Absolute: 0.1 10*3/uL (ref 0.0–0.1)
Lymphocytes Relative: 21 % (ref 12–46)
Lymphs Abs: 2.2 10*3/uL (ref 0.7–4.0)
Monocytes Absolute: 1 10*3/uL (ref 0.1–1.0)
Monocytes Relative: 10 % (ref 3–12)
Neutro Abs: 6.3 10*3/uL (ref 1.7–7.7)

## 2011-11-01 LAB — URINALYSIS, ROUTINE W REFLEX MICROSCOPIC
Nitrite: NEGATIVE
Specific Gravity, Urine: 1.013 (ref 1.005–1.030)
Urobilinogen, UA: 0.2 mg/dL (ref 0.0–1.0)
pH: 7 (ref 5.0–8.0)

## 2011-11-01 LAB — CREATININE, SERUM
Creatinine, Ser: 1.3 mg/dL — ABNORMAL HIGH (ref 0.50–1.10)
GFR calc Af Amer: 40 mL/min — ABNORMAL LOW (ref >90)

## 2011-11-01 LAB — BASIC METABOLIC PANEL
CO2: 28 mEq/L (ref 19–32)
Chloride: 102 mEq/L (ref 96–112)
Creatinine, Ser: 1.5 mg/dL — ABNORMAL HIGH (ref 0.50–1.10)
Glucose, Bld: 80 mg/dL (ref 70–99)

## 2011-11-01 LAB — URINE MICROSCOPIC-ADD ON

## 2011-11-01 LAB — GLUCOSE, CAPILLARY: Glucose-Capillary: 83 mg/dL (ref 70–99)

## 2011-11-01 MED ORDER — TRAMADOL HCL 50 MG PO TABS
50.0000 mg | ORAL_TABLET | Freq: Three times a day (TID) | ORAL | Status: DC | PRN
  Filled 2011-11-01: qty 1

## 2011-11-01 MED ORDER — MEMANTINE HCL 5 MG PO TABS
5.0000 mg | ORAL_TABLET | Freq: Two times a day (BID) | ORAL | Status: DC
  Administered 2011-11-02 – 2011-11-06 (×10): 5 mg via ORAL
  Filled 2011-11-01 (×11): qty 1

## 2011-11-01 MED ORDER — ALBUTEROL SULFATE (5 MG/ML) 0.5% IN NEBU
INHALATION_SOLUTION | RESPIRATORY_TRACT | Status: AC
  Filled 2011-11-01: qty 1

## 2011-11-01 MED ORDER — ESCITALOPRAM OXALATE 20 MG PO TABS
20.0000 mg | ORAL_TABLET | Freq: Every day | ORAL | Status: DC
  Administered 2011-11-02 – 2011-11-06 (×6): 20 mg via ORAL
  Filled 2011-11-01 (×6): qty 1

## 2011-11-01 MED ORDER — ONDANSETRON HCL 4 MG PO TABS: 4.0000 mg | ORAL_TABLET | Freq: Four times a day (QID) | ORAL | Status: DC | PRN

## 2011-11-01 MED ORDER — LEVOTHYROXINE SODIUM 25 MCG PO TABS
25.0000 ug | ORAL_TABLET | Freq: Every day | ORAL | Status: DC
  Administered 2011-11-02 – 2011-11-06 (×6): 25 ug via ORAL
  Filled 2011-11-01 (×6): qty 1

## 2011-11-01 MED ORDER — DEXTROSE 5 % IV SOLN
1.0000 g | INTRAVENOUS | Status: DC
  Administered 2011-11-02 – 2011-11-03 (×2): 1 g via INTRAVENOUS
  Filled 2011-11-01 (×4): qty 10

## 2011-11-01 MED ORDER — NALOXONE HCL 0.4 MG/ML IJ SOLN
INTRAMUSCULAR | Status: AC
  Filled 2011-11-01: qty 2

## 2011-11-01 MED ORDER — DEXTROSE-NACL 5-0.45 % IV SOLN
INTRAVENOUS | Status: DC
  Administered 2011-11-02: 01:00:00 via INTRAVENOUS

## 2011-11-01 MED ORDER — ACYCLOVIR 200 MG PO CAPS
200.0000 mg | ORAL_CAPSULE | Freq: Every day | ORAL | Status: DC
  Administered 2011-11-01 – 2011-11-06 (×6): 200 mg via ORAL
  Filled 2011-11-01 (×6): qty 1

## 2011-11-01 MED ORDER — POLYETHYLENE GLYCOL 3350 17 G PO PACK
17.0000 g | PACK | Freq: Every day | ORAL | Status: DC
  Administered 2011-11-02 – 2011-11-06 (×6): 17 g via ORAL
  Filled 2011-11-01 (×6): qty 1

## 2011-11-01 MED ORDER — ENOXAPARIN SODIUM 40 MG/0.4ML ~~LOC~~ SOLN
40.0000 mg | SUBCUTANEOUS | Status: DC
  Administered 2011-11-02: 40 mg via SUBCUTANEOUS
  Filled 2011-11-01 (×2): qty 0.4

## 2011-11-01 MED ORDER — SODIUM CHLORIDE 0.9 % IV SOLN
INTRAVENOUS | Status: DC
  Administered 2011-11-01 – 2011-11-02 (×2): via INTRAVENOUS

## 2011-11-01 MED ORDER — ISOSORBIDE DINITRATE 30 MG PO TABS
30.0000 mg | ORAL_TABLET | Freq: Four times a day (QID) | ORAL | Status: DC
  Administered 2011-11-02 – 2011-11-06 (×17): 30 mg via ORAL
  Filled 2011-11-01 (×21): qty 1

## 2011-11-01 MED ORDER — ALBUTEROL SULFATE HFA 108 (90 BASE) MCG/ACT IN AERS: 4.0000 | INHALATION_SPRAY | Freq: Once | RESPIRATORY_TRACT | Status: DC

## 2011-11-01 MED ORDER — ASPIRIN 81 MG PO CHEW
81.0000 mg | CHEWABLE_TABLET | Freq: Every day | ORAL | Status: DC
  Administered 2011-11-02 – 2011-11-06 (×6): 81 mg via ORAL
  Filled 2011-11-01 (×8): qty 1

## 2011-11-01 MED ORDER — POLYVINYL ALCOHOL 1.4 % OP SOLN
1.0000 [drp] | OPHTHALMIC | Status: DC | PRN
  Filled 2011-11-01: qty 15

## 2011-11-01 MED ORDER — PANTOPRAZOLE SODIUM 40 MG PO TBEC
40.0000 mg | DELAYED_RELEASE_TABLET | Freq: Every day | ORAL | Status: DC
  Administered 2011-11-02 – 2011-11-06 (×6): 40 mg via ORAL
  Filled 2011-11-01 (×6): qty 1

## 2011-11-01 MED ORDER — ACETAMINOPHEN 325 MG PO TABS
650.0000 mg | ORAL_TABLET | Freq: Three times a day (TID) | ORAL | Status: DC
  Administered 2011-11-02 – 2011-11-06 (×13): 650 mg via ORAL
  Filled 2011-11-01 (×13): qty 2

## 2011-11-01 MED ORDER — FLUTICASONE-SALMETEROL 100-50 MCG/DOSE IN AEPB
1.0000 | INHALATION_SPRAY | Freq: Two times a day (BID) | RESPIRATORY_TRACT | Status: DC
  Administered 2011-11-02 – 2011-11-05 (×8): 1 via RESPIRATORY_TRACT
  Filled 2011-11-01: qty 14

## 2011-11-01 MED ORDER — NALOXONE HCL 1 MG/ML IJ SOLN
INTRAMUSCULAR | Status: AC
  Filled 2011-11-01: qty 2

## 2011-11-01 MED ORDER — ATORVASTATIN CALCIUM 10 MG PO TABS
10.0000 mg | ORAL_TABLET | Freq: Every day | ORAL | Status: DC
  Administered 2011-11-02 – 2011-11-06 (×6): 10 mg via ORAL
  Filled 2011-11-01 (×6): qty 1

## 2011-11-01 MED ORDER — QUETIAPINE FUMARATE 25 MG PO TABS
25.0000 mg | ORAL_TABLET | Freq: Every day | ORAL | Status: DC
  Administered 2011-11-02 (×2): 25 mg via ORAL
  Filled 2011-11-01 (×2): qty 1

## 2011-11-01 MED ORDER — ALBUTEROL SULFATE (5 MG/ML) 0.5% IN NEBU: 2.5000 mg | INHALATION_SOLUTION | RESPIRATORY_TRACT | Status: DC | PRN

## 2011-11-01 MED ORDER — IPRATROPIUM BROMIDE 0.02 % IN SOLN: 0.5000 mg | RESPIRATORY_TRACT | Status: DC | PRN

## 2011-11-01 MED ORDER — ALUM & MAG HYDROXIDE-SIMETH 200-200-20 MG/5ML PO SUSP
30.0000 mL | Freq: Four times a day (QID) | ORAL | Status: DC | PRN
  Filled 2011-11-01: qty 30

## 2011-11-01 MED ORDER — HYPROMELLOSE (GONIOSCOPIC) 2.5 % OP SOLN
1.0000 [drp] | OPHTHALMIC | Status: DC
  Filled 2011-11-01: qty 15

## 2011-11-01 MED ORDER — ONDANSETRON HCL 4 MG/2ML IJ SOLN: 4.0000 mg | Freq: Four times a day (QID) | INTRAMUSCULAR | Status: DC | PRN

## 2011-11-01 MED ORDER — ALBUTEROL SULFATE (5 MG/ML) 0.5% IN NEBU
5.0000 mg | INHALATION_SOLUTION | Freq: Once | RESPIRATORY_TRACT | Status: AC
  Administered 2011-11-01: 5 mg via RESPIRATORY_TRACT

## 2011-11-01 MED ORDER — DEXTROSE 5 % IV SOLN
1.0000 g | Freq: Once | INTRAVENOUS | Status: AC
  Administered 2011-11-02: 1 g via INTRAVENOUS
  Filled 2011-11-01: qty 10

## 2011-11-01 MED ORDER — TIOTROPIUM BROMIDE MONOHYDRATE 18 MCG IN CAPS
18.0000 ug | ORAL_CAPSULE | Freq: Every day | RESPIRATORY_TRACT | Status: DC
  Administered 2011-11-02 – 2011-11-05 (×4): 18 ug via RESPIRATORY_TRACT
  Filled 2011-11-01: qty 5

## 2011-11-01 MED ORDER — GABAPENTIN 100 MG PO CAPS
100.0000 mg | ORAL_CAPSULE | Freq: Three times a day (TID) | ORAL | Status: DC
  Administered 2011-11-02 – 2011-11-06 (×14): 100 mg via ORAL
  Filled 2011-11-01 (×17): qty 1

## 2011-11-01 NOTE — H&P (Signed)
[name] [name] MRN: [id number] DOB/AGE: 10/07/1917 78 y.o. Primary Care Physician:GREEN, Lenon Curt, MD Admit date: 11/01/2011 Chief Complaint: Altered mental status/acute renal failure HPI:  [name] [name] is a pleasant 78 year old Caucasian female with a history of hypertension, COPD, coronary artery disease, history of CHF, history of chronic kidney disease stage II last creatinine was 1.05 10/10/2011, history of gastroesophageal reflux disease, history of dementia, history of Parkinson's disease, history of hypothyroidism. Patient is wheelchair-bound and a  nursing home resident at [facility] who presented with altered mental status. Patient's sister is at bedside and gives most of the history. History is also obtained from the ED physician's note. Per ED physician's note and the patient's sister patient has chronic pain secondary to a loose screw in her hip and is on a fentanyl patch at baseline and also Vicodin on as needed basis. Patient at the nursing home was complaining of increased pain and was given some Vicodin addition to a fentanyl patch. Patient subsequently became lethargic and EMS was called. Patient was seen per EMS fentanyl patch was removed the patient was given 2 mg of Narcan with improvement in her mentation. Patient was subsequently brought to the ED. Patient denies any fevers, no chills, no nausea, no vomiting, no chest pain, no shortness of breath, patient does endorse a productive cough of whitish sputum, also endorses some wheezing, some generalized weakness, and some shortness of breath. Patient denies any diarrhea, no constipation, no dysuria, patient does endorse some decreased urine output on the day of admission. Patient also states she has good oral intake. Patient was seen in the ED chest x-ray which was done had some concerns for CHF however clinically patient will try. Labs obtained did show a creatinine of 1.50 and then a BUN of 25. Otherwise rest of the labs were within normal  limits. Will call to admit the patient for an acute renal failure.  Past Medical History  Diagnosis Date  . Hypertension   . COPD (chronic obstructive pulmonary disease)   . CHF (congestive heart failure)   . Coronary artery disease   . Renal disorder   . GERD (gastroesophageal reflux disease)   . Osteoporosis   . Syncope   . Dementia   . Anemia   . Parkinson disease   . Neuropathy   . Thyroid disease   . Hyperlipidemia   . Anxiety   . Depression   . Chronic pain 11/01/2011    Past Surgical History  Procedure Date  . Fracture surgery     Prior to Admission medications   Medication Sig Start Date End Date Taking? Authorizing Provider  acyclovir (ZOVIRAX) 200 MG capsule Take 200 mg by mouth daily.   Yes Historical Provider, MD  aspirin 81 MG tablet Take 81 mg by mouth daily.   Yes Historical Provider, MD  atorvastatin (LIPITOR) 10 MG tablet Take 10 mg by mouth daily.   Yes Historical Provider, MD  calcium-vitamin D (OSCAL WITH D) 500-200 MG-UNIT per tablet Take 1 tablet by mouth daily.   Yes Historical Provider, MD  escitalopram (LEXAPRO) 20 MG tablet Take 20 mg by mouth daily.   Yes Historical Provider, MD  fentaNYL (DURAGESIC - DOSED MCG/HR) 25 MCG/HR Place 1 patch onto the skin every 3 (three) days.   Yes Historical Provider, MD  Fluticasone-Salmeterol (ADVAIR) 100-50 MCG/DOSE AEPB Inhale 1 puff into the lungs every 12 (twelve) hours.   Yes Historical Provider, MD  furosemide (LASIX) 40 MG tablet Take 40 mg by mouth daily.  Yes Historical Provider, MD  gabapentin (NEURONTIN) 100 MG capsule Take 100 mg by mouth 3 (three) times daily.   Yes Historical Provider, MD  HYDROcodone-acetaminophen (NORCO) 5-325 MG per tablet Take 1 tablet by mouth every 6 (six) hours as needed.   Yes Historical Provider, MD  hydroxypropyl methylcellulose (ISOPTO TEARS) 2.5 % ophthalmic solution Place 1 drop into both eyes every 4 (four) hours.   Yes Historical Provider, MD  isosorbide dinitrate  (ISORDIL) 30 MG tablet Take 30 mg by mouth 4 (four) times daily.   Yes Historical Provider, MD  levothyroxine (SYNTHROID, LEVOTHROID) 25 MCG tablet Take 25 mcg by mouth daily.   Yes Historical Provider, MD  losartan (COZAAR) 50 MG tablet Take 50 mg by mouth 2 (two) times daily.   Yes Historical Provider, MD  memantine (NAMENDA) 5 MG tablet Take 5 mg by mouth 2 (two) times daily.   Yes Historical Provider, MD  omeprazole (PRILOSEC) 20 MG capsule Take 20 mg by mouth daily.   Yes Historical Provider, MD  polyethylene glycol (MIRALAX / GLYCOLAX) packet Take 17 g by mouth daily.   Yes Historical Provider, MD  QUEtiapine (SEROQUEL) 25 MG tablet Take 25 mg by mouth daily.   Yes Historical Provider, MD  tiotropium (SPIRIVA) 18 MCG inhalation capsule Place 18 mcg into inhaler and inhale daily.   Yes Historical Provider, MD  traMADol (ULTRAM) 50 MG tablet Take 50 mg by mouth 2 (two) times daily. Twice daily per [facility]   Yes Historical Provider, MD    Allergies: No Known Allergies  No family history on file.  Social History:  does not have a smoking history on file. She does not have any smokeless tobacco history on file. She reports that she does not drink alcohol or use illicit drugs.  ROS: All systems reviewed with the patient and was positive as per HPI otherwise all other systems are negative.  PHYSICAL EXAM: Blood pressure 151/97, pulse 78, temperature 97.1 F (36.2 C), temperature source Oral, resp. rate 25, SpO2 95.00%. General: Well-developed well-nourished in no acute cardiopulmonary distress.  HEENT: Normocephalic atraumatic. Pupils equal around and reactive to light and accommodation. Extraocular movements intact. Oropharynx is clear, no lesions, no exudates. Neck is supple no lymphadenopathy. Extremely dry mucous membranes. No bruits, no goiter. Heart: Regular rate and rhythm, without murmurs, rubs, gallops. Lungs: Clear to auscultation bilaterally. Abdomen: Soft, nontender, nondistended,  positive bowel sounds. Extremities: No clubbing cyanosis. Slight discoloration in the lower extremities with some slight erythema to her sister this has been chronic and ongoing for the past several months. Patient with 1+ bilateral lower extremity edema. with positive pedal pulses. Neuro: Patient alert to self. Patient with some confusion. Cranial nerves II through XII are grossly intact. Patient moving extremities spontaneously. Sensation is intact. Gait not tested secondary to safety. Grossly intact, nonfocal.    EKG: Normal sinus rhythm. Reduced voltage. Poor R wave progression.  No results found for this or any previous visit (from the past 240 hour(s)).   Lab results:  [facility] 11/01/11 1718  NA 141  K 4.3  CL 102  CO2 28  GLUCOSE 80  BUN 25*  CREATININE 1.50*  CALCIUM 9.5  MG --  PHOS --   No results found for this basename: AST:2,ALT:2,ALKPHOS:2,BILITOT:2,PROT:2,ALBUMIN:2 in the last 72 hours No results found for this basename: LIPASE:2,AMYLASE:2 in the last 72 hours  Basename 11/01/11 1718  WBC 10.3  NEUTROABS 6.3  HGB 14.7  HCT 44.4  MCV 98.2  PLT 276  No results found for this basename: CKTOTAL:3,CKMB:3,CKMBINDEX:3,TROPONINI:3 in the last 72 hours No components found with this basename: POCBNP:3 No results found for this basename: DDIMER in the last 72 hours No results found for this basename: HGBA1C:2 in the last 72 hours No results found for this basename: CHOL:2,HDL:2,LDLCALC:2,TRIG:2,CHOLHDL:2,LDLDIRECT:2 in the last 72 hours No results found for this basename: TSH,T4TOTAL,FREET3,T3FREE,THYROIDAB in the last 72 hours No results found for this basename: VITAMINB12:2,FOLATE:2,FERRITIN:2,TIBC:2,IRON:2,RETICCTPCT:2 in the last 72 hours Imaging results:  Dg Chest Port 1 View  11/01/2011  *RADIOLOGY REPORT*  Clinical Data: Wheezing and cough  PORTABLE CHEST - 1 VIEW  Comparison: 07/04/2010  Findings: There is mild cardiac enlargement.  Bilateral pleural effusions  are noted left greater than right.  There is pulmonary venous congestion.  Atelectasis noted in the lung bases.  IMPRESSION:  1.  Mild CHF.  Original Report Authenticated By: Rosealee Albee, M.D.   Impression/Plan:  Principal Problem:  *Renal failure (ARF), acute on chronic Active Problems:  GERD (gastroesophageal reflux disease)  Dementia  Anemia  Parkinson disease  Hyperlipidemia  Anxiety  Depression  Encephalopathy acute  Chronic pain  Cellulitis   #1 acute on chronic renal failure Patient's last creatinine was 1.05 10/10/2011 per nursing home records. Likely a prerenal azotemia secondary to dehydration in the setting of diuretic and ARB. Patient's current creatinine is 1.50. Will admit the patient to a MedSurg floor. Will check a urine sodium and a urine creatinine. Will check a renal ultrasound. We'll place a Foley catheter. Will hydrate gently with normal saline at 75 cc per hour. Will hold patient's ARB and diuretic and follow.  #2 acute encephalopathy Secondary to narcotic-induced in the setting of acute renal failure. Patient is status post 2 mg of Narcan and essentially pulled back close to her baseline. Urinalysis is pending. Chest x-ray is negative for any infiltrate. Will monitor follow for now.  #3 gastroesophageal reflux disease PPI  #4 anemia Stable follow H&H  #5 hyperlipidemia Continue Lipitor  #6 Parkinson's disease Stable.   #7 depression/anxiety Continue Lexapro.  #8 COPD Stable. Continue Advair Spiriva. Albuterol and Atrovent nebs when necessary.  #9 chronic pain Will hold off on patient's narcotic medications secondary to problem #2. Will place on scheduled Tylenol and Ultram as needed.  #10 coronary artery disease/history of CHF Stable. Will hold patient's Lasix secondary to problem #1. Will continue patient's aspirin. Will hold patient's ARB.  #11 hypothyroidism Check a TSH Continue home dose Synthroid.  #12 questionable bilateral lower  extremity cellulitis Persist the patient has had this redness and slight erythema for the past several months. Will place patient empirically on IV Rocephin and follow.  #13 prophylaxis PPI for GI prophylaxis, Lovenox for DVT prophylaxis.   Bettyjean Stefanski [phone] 11/01/2011, 8:00 PM

## 2011-11-01 NOTE — ED Provider Notes (Signed)
History     CSN: [id number]  Arrival date & time 11/01/11  1626   First MD Initiated Contact with Patient 11/01/11 1638      Chief Complaint  Patient presents with  . Altered Mental Status     Patient is a 78 y.o. female presenting with altered mental status. The history is provided by the patient, a relative and the EMS personnel.  Altered Mental Status This is a new problem. The current episode started today (Patient wears a Fentanyl patch at baseline for chronic hip and back pain; she was also given PO Vicodin at her nursing home for continued pain; mental status returned to baseline after 2mg  of Narcan by EMS). The problem occurs constantly. The problem has been resolved. Associated symptoms include arthralgias and coughing (productive). Pertinent negatives include no abdominal pain, chest pain, chills, fever, neck pain, rash or vomiting. Exacerbated by: Fentanyl and Vicodin. Treatments tried: Narcan. The treatment provided significant relief.    Past Medical History  Diagnosis Date  . Hypertension   . COPD (chronic obstructive pulmonary disease)   . CHF (congestive heart failure)   . Coronary artery disease   . Renal disorder   . GERD (gastroesophageal reflux disease)   . Osteoporosis   . Syncope   . Dementia   . Anemia   . Parkinson disease   . Neuropathy   . Thyroid disease   . Hyperlipidemia   . Anxiety   . Depression     Past Surgical History  Procedure Date  . Fracture surgery     No family history on file.  History  Substance Use Topics  . Smoking status: Not on file  . Smokeless tobacco: Not on file  . Alcohol Use: No    OB History    Grav Para Term Preterm Abortions TAB SAB Ect Mult Living                  Review of Systems  Constitutional: Negative for fever, chills, activity change and appetite change.  HENT: Negative for neck pain and neck stiffness.   Respiratory: Positive for cough (productive) and wheezing. Negative for chest tightness  and shortness of breath.   Cardiovascular: Negative for chest pain and palpitations.  Gastrointestinal: Negative for vomiting, abdominal pain, diarrhea and constipation.  Genitourinary: Negative for dysuria, decreased urine volume and difficulty urinating.  Musculoskeletal: Positive for arthralgias.  Skin: Negative for rash and wound.  Neurological: Negative for seizures, syncope, facial asymmetry and light-headedness.  Psychiatric/Behavioral: Positive for altered mental status. Negative for confusion and agitation.  All other systems reviewed and are negative.    Allergies  Review of patient's allergies indicates no known allergies.  Home Medications   Current Outpatient Rx  Name Route Sig Dispense Refill  . ACYCLOVIR 200 MG PO CAPS Oral Take 200 mg by mouth daily.    . ASPIRIN 81 MG PO TABS Oral Take 81 mg by mouth daily.    . ATORVASTATIN CALCIUM 10 MG PO TABS Oral Take 10 mg by mouth daily.    Marland Kitchen CALCIUM CARBONATE-VITAMIN D 500-200 MG-UNIT PO TABS Oral Take 1 tablet by mouth daily.    Marland Kitchen ESCITALOPRAM OXALATE 20 MG PO TABS Oral Take 20 mg by mouth daily.    . FENTANYL 25 MCG/HR TD PT72 Transdermal Place 1 patch onto the skin every 3 (three) days.    Marland Kitchen FLUTICASONE-SALMETEROL 100-50 MCG/DOSE IN AEPB Inhalation Inhale 1 puff into the lungs every 12 (twelve) hours.    Marland Kitchen  FUROSEMIDE 40 MG PO TABS Oral Take 40 mg by mouth daily.    Marland Kitchen GABAPENTIN 100 MG PO CAPS Oral Take 100 mg by mouth 3 (three) times daily.    Marland Kitchen HYDROCODONE-ACETAMINOPHEN 5-325 MG PO TABS Oral Take 1 tablet by mouth every 6 (six) hours as needed.    Marland Kitchen HYPROMELLOSE 2.5 % OP SOLN Both Eyes Place 1 drop into both eyes every 4 (four) hours.    . ISOSORBIDE DINITRATE 30 MG PO TABS Oral Take 30 mg by mouth 4 (four) times daily.    Marland Kitchen LEVOTHYROXINE SODIUM 25 MCG PO TABS Oral Take 25 mcg by mouth daily.    Marland Kitchen LOSARTAN POTASSIUM 50 MG PO TABS Oral Take 50 mg by mouth 2 (two) times daily.    Marland Kitchen MEMANTINE HCL 5 MG PO TABS Oral Take 5 mg  by mouth 2 (two) times daily.    Marland Kitchen OMEPRAZOLE 20 MG PO CPDR Oral Take 20 mg by mouth daily.    Marland Kitchen POLYETHYLENE GLYCOL 3350 PO PACK Oral Take 17 g by mouth daily.    . QUETIAPINE FUMARATE 25 MG PO TABS Oral Take 25 mg by mouth daily.    Marland Kitchen TIOTROPIUM BROMIDE MONOHYDRATE 18 MCG IN CAPS Inhalation Place 18 mcg into inhaler and inhale daily.    . TRAMADOL HCL 50 MG PO TABS Oral Take 50 mg by mouth 2 (two) times daily. Twice daily per MAR      BP 158/78  Pulse 78  Temp 97.1 F (36.2 C) (Oral)  Resp 16  SpO2 99%  Physical Exam  Nursing note and vitals reviewed. Constitutional: She is oriented to person, place, and time. She appears well-developed and well-nourished.  HENT:  Head: Normocephalic and atraumatic.  Right Ear: External ear normal.  Left Ear: External ear normal.  Nose: Nose normal.  Mouth/Throat: Oropharynx is clear and moist. No oropharyngeal exudate.  Eyes: Conjunctivae are normal. Pupils are equal, round, and reactive to light.  Neck: Normal range of motion. Neck supple.  Cardiovascular: Normal rate, regular rhythm, normal heart sounds and intact distal pulses.  Exam reveals no gallop and no friction rub.   No murmur heard. Pulmonary/Chest: Effort normal. No respiratory distress. She has wheezes (bilateral and diffuse). She has no rales. She exhibits no tenderness.  Abdominal: Soft. Bowel sounds are normal. She exhibits no distension and no mass. There is no tenderness. There is no rebound and no guarding.  Musculoskeletal: Normal range of motion. She exhibits no edema and no tenderness.  Neurological: She is alert and oriented to person, place, and time.  Skin: Skin is warm and dry.  Psychiatric: She has a normal mood and affect. Her behavior is normal. Judgment and thought content normal.    ED Course  Procedures (including critical care time)  Labs Reviewed  DIFFERENTIAL - Abnormal; Notable for the following:    Eosinophils Relative 6 (*)     All other components  within normal limits  BASIC METABOLIC PANEL - Abnormal; Notable for the following:    BUN 25 (*)     Creatinine, Ser 1.50 (*)     GFR calc non Af Amer 29 (*)     GFR calc Af Amer 34 (*)     All other components within normal limits  URINALYSIS, ROUTINE W REFLEX MICROSCOPIC - Abnormal; Notable for the following:    Hgb urine dipstick TRACE (*)     All other components within normal limits  CBC - Abnormal; Notable for the following:  WBC 11.2 (*)     All other components within normal limits  CREATININE, SERUM - Abnormal; Notable for the following:    Creatinine, Ser 1.30 (*)     GFR calc non Af Amer 35 (*)     GFR calc Af Amer 40 (*)     All other components within normal limits  URINE MICROSCOPIC-ADD ON - Abnormal; Notable for the following:    Casts HYALINE CASTS (*)     All other components within normal limits  CBC  GLUCOSE, CAPILLARY  URINE CULTURE  MAGNESIUM  TSH  COMPREHENSIVE METABOLIC PANEL  CBC  SODIUM, URINE, RANDOM  CREATININE, URINE, RANDOM   No results found.   1. Acute renal failure   2. Narcotic overdose      Date: 11/01/2011  Rate: 76 bpm  Rhythm: normal sinus rhythm  QRS Axis: normal  Intervals: normal  ST/T Wave abnormalities: normal  Conduction Disutrbances:none  Narrative Interpretation: No evidence of acute ischemia or arrythmia  Old EKG Reviewed: unchanged (06/28/11)    MDM  78 yo F presents for somnolence after taking PO Vicodin for hip pain. Pt also wears a Fentanyl patch, which was removed by EMS. Pt's mental status returned to baseline after a dose of 2mg  Narcan and remains at baseline per her sister, who is at bedside. She is wheezing on exam and has a productive cough; will obtain CXR and provide albuterol.   CXR not c/w pneumonia; however, labs concerning for acute renal failure. Suspect this as the cause of her decreased responsiveness after Vicodin. Hospitalist service consulted and will admit.         Clemetine Marker,  MD 11/01/11 [phone]

## 2011-11-01 NOTE — ED Notes (Signed)
RT aware of need for [facility]

## 2011-11-01 NOTE — ED Notes (Signed)
From [facility] - staff reports pt very lethargic & drowsy today. Was given a vicodin earlier today. Per EMS - given narcan 2mg  IVP with improvement in alertness.

## 2011-11-01 NOTE — ED Notes (Signed)
Pt presently A&OX3, per family now back to normal. Pt with frequent sneezing. ED MD aware that EMS had removed fentanyl patch prior to giving narcan. Pt not lethargic, or drowsy.

## 2011-11-02 ENCOUNTER — Inpatient Hospital Stay (HOSPITAL_COMMUNITY): Payer: Medicare Other

## 2011-11-02 DIAGNOSIS — I959 Hypotension, unspecified: Secondary | ICD-10-CM

## 2011-11-02 DIAGNOSIS — L03039 Cellulitis of unspecified toe: Secondary | ICD-10-CM

## 2011-11-02 DIAGNOSIS — L02619 Cutaneous abscess of unspecified foot: Secondary | ICD-10-CM

## 2011-11-02 DIAGNOSIS — R197 Diarrhea, unspecified: Secondary | ICD-10-CM

## 2011-11-02 DIAGNOSIS — N179 Acute kidney failure, unspecified: Secondary | ICD-10-CM

## 2011-11-02 LAB — COMPREHENSIVE METABOLIC PANEL
Albumin: 2.4 g/dL — ABNORMAL LOW (ref 3.5–5.2)
BUN: 23 mg/dL (ref 6–23)
Calcium: 8.6 mg/dL (ref 8.4–10.5)
Creatinine, Ser: 1.1 mg/dL (ref 0.50–1.10)
Potassium: 3.6 mEq/L (ref 3.5–5.1)
Total Protein: 5.5 g/dL — ABNORMAL LOW (ref 6.0–8.3)

## 2011-11-02 LAB — CBC
HCT: 35.1 % — ABNORMAL LOW (ref 36.0–46.0)
HCT: 33.5 % — ABNORMAL LOW (ref 36.0–46.0)
Hemoglobin: 11.8 g/dL — ABNORMAL LOW (ref 12.0–15.0)
Hemoglobin: 11.1 g/dL — ABNORMAL LOW (ref 12.0–15.0)
MCH: 32.2 pg (ref 26.0–34.0)
MCHC: 33.1 g/dL (ref 30.0–36.0)
MCV: 96.4 fL (ref 78.0–100.0)
MCV: 97.1 fL (ref 78.0–100.0)
RBC: 3.64 MIL/uL — ABNORMAL LOW (ref 3.87–5.11)
WBC: 9.3 10*3/uL (ref 4.0–10.5)

## 2011-11-02 LAB — BASIC METABOLIC PANEL
BUN: 24 mg/dL — ABNORMAL HIGH (ref 6–23)
CO2: 26 mEq/L (ref 19–32)
Chloride: 105 mEq/L (ref 96–112)
Glucose, Bld: 81 mg/dL (ref 70–99)
Potassium: 3.4 mEq/L — ABNORMAL LOW (ref 3.5–5.1)

## 2011-11-02 LAB — URINE MICROSCOPIC-ADD ON

## 2011-11-02 LAB — MRSA PCR SCREENING: MRSA by PCR: NEGATIVE

## 2011-11-02 LAB — URINALYSIS, ROUTINE W REFLEX MICROSCOPIC
Glucose, UA: NEGATIVE mg/dL
pH: 5.5 (ref 5.0–8.0)

## 2011-11-02 LAB — MAGNESIUM: Magnesium: 2 mg/dL (ref 1.5–2.5)

## 2011-11-02 MED ORDER — POTASSIUM CHLORIDE CRYS ER 20 MEQ PO TBCR
40.0000 meq | EXTENDED_RELEASE_TABLET | Freq: Once | ORAL | Status: AC
  Administered 2011-11-02: 40 meq via ORAL
  Filled 2011-11-02: qty 2

## 2011-11-02 MED ORDER — SODIUM CHLORIDE 0.9 % IV BOLUS (SEPSIS)
1000.0000 mL | Freq: Once | INTRAVENOUS | Status: AC
  Administered 2011-11-02: 1000 mL via INTRAVENOUS

## 2011-11-02 MED ORDER — SODIUM CHLORIDE 0.9 % IV BOLUS (SEPSIS)
500.0000 mL | Freq: Once | INTRAVENOUS | Status: AC
  Administered 2011-11-02: 500 mL via INTRAVENOUS

## 2011-11-02 MED ORDER — SODIUM CHLORIDE 0.9 % IV SOLN
INTRAVENOUS | Status: DC
  Administered 2011-11-02 – 2011-11-03 (×3): via INTRAVENOUS

## 2011-11-02 MED ORDER — ENOXAPARIN SODIUM 30 MG/0.3ML ~~LOC~~ SOLN
30.0000 mg | SUBCUTANEOUS | Status: DC
  Administered 2011-11-02 – 2011-11-03 (×2): 30 mg via SUBCUTANEOUS
  Filled 2011-11-02 (×4): qty 0.3

## 2011-11-02 NOTE — Evaluation (Signed)
Physical Therapy Evaluation Patient Details Name: [name] [name] MRN: [id number] DOB: 1918/03/12 Today's Date: 11/02/2011 Time: 0730-0802 PT Time Calculation (min): 32 min  PT Assessment / Plan / Recommendation Clinical Impression  78 year old admitted with AMS. Pt currently requires +2 total assist for squat pivot transfer to chair, she is unable to stand at this point. She is primarily limited by Left hip pain which she is unable to rate in addition to impaired memory. She did however know where she was and her name, is a very sweet lady. Recommend SNF at this time.    PT Assessment  Patient needs continued PT services    Follow Up Recommendations  Supervision/Assistance - 24 hour;Skilled nursing facility    Barriers to Discharge None      lEquipment Recommendations  Defer to next venue       Frequency Min 3X/week    Precautions / Restrictions Precautions Precautions: Fall Precaution Comments: High fall risk given confusion and weakness.         Mobility  Bed Mobility Bed Mobility: Rolling Right;Right Sidelying to Sit Rolling Right: 2: Max assist Right Sidelying to Sit: 1: +1 Total assist Details for Bed Mobility Assistance: Verbal/tactile cues for sequencing, pt unable to initiate by self. Pad utilized to complete roll.  Transfers Transfers: Sit to Stand;Stand to Sit;Squat Pivot Transfers Sit to Stand: 1: +2 Total assist;From bed Sit to Stand: Patient Percentage: 20% Stand to Sit: 1: +2 Total assist;To bed Stand to Sit: Patient Percentage: 10% Squat Pivot Transfers: 1: +2 Total assist Squat Pivot Transfers: Patient Percentage: 20% Details for Transfer Assistance: Pt unable to clear bed with +2 total assist due to weakness and Lt. hip pain. Max verbal cues for contribution to transfer including weight shift and placement of UEs.  Ambulation/Gait Ambulation/Gait Assistance: Not tested (comment) (pt unable)        PT Diagnosis: Generalized weakness;Altered mental  status  PT Problem List: Decreased strength;Decreased activity tolerance;Pain;Decreased cognition;Decreased mobility;Decreased balance PT Treatment Interventions: DME instruction;Functional mobility training;Therapeutic activities;Therapeutic exercise;Balance training;Patient/family education   PT Goals Acute Rehab PT Goals PT Goal Formulation: Patient unable to participate in goal setting Time For Goal Achievement: 11/02/11 Potential to Achieve Goals: Good Pt will Roll Supine to Right Side: with min assist PT Goal: Rolling Supine to Right Side - Progress: Goal set today Pt will Roll Supine to Left Side: with min assist PT Goal: Rolling Supine to Left Side - Progress: Goal set today Pt will go Supine/Side to Sit: with min assist PT Goal: Supine/Side to Sit - Progress: Goal set today Pt will go Sit to Supine/Side: with mod assist PT Goal: Sit to Supine/Side - Progress: Goal set today Pt will go Sit to Stand: with mod assist PT Goal: Sit to Stand - Progress: Goal set today Pt will go Stand to Sit: with mod assist PT Goal: Stand to Sit - Progress: Goal set today Pt will Transfer Bed to Chair/Chair to Bed: with mod assist PT Transfer Goal: Bed to Chair/Chair to Bed - Progress: Goal set today Pt will Propel Wheelchair: 51 - 150 feet;with supervision PT Goal: Propel Wheelchair - Progress: Goal set today  Visit Information  Last PT Received On: 11/02/11 Assistance Needed: +2    Subjective Data  Subjective: I'm doing ok Patient Stated Goal: Feel better   Prior Functioning  Home Living Lives With: Other (Comment) (at [facility]) Available Help at Discharge: Other (Comment);Personal care attendant (at [facility]) Type of Home: Assisted living (at [facility]  Home) Home Access: Level entry Home Layout:  (unable to retrieve from pt, no family available) Home Adaptive Equipment: Walker - rolling (wheelchair available) Additional Comments: Pt reports she was able to stand pivot transfer  to wheelchair with RW, uncertain of accuracy. Prior Function Level of Independence: Needs assistance Needs Assistance: Bathing;Dressing;Feeding Bath:  (uncertain of amount of assist needed) Dressing:  (uncertain of amount ) Feeding: Minimal Able to Take Stairs?: No Driving: No Vocation: Retired Musician: No difficulties Dominant Hand: Right    Cognition  Overall Cognitive Status: Impaired Area of Impairment: Attention;Memory;Problem solving Arousal/Alertness: Lethargic (falls asleep frequently during evaluation) Orientation Level: Disoriented to;Time;Situation Behavior During Session: Lethargic Memory Deficits: Pt forgets that she has told therapist about hip pain.  Problem Solving: Impaired, slow    Extremity/Trunk Assessment Right Lower Extremity Assessment RLE ROM/Strength/Tone: Deficits;Due to impaired cognition;Unable to fully assess RLE ROM/Strength/Tone Deficits: Significant weakness, impaired dorsiflexion ROM.  RLE Sensation: WFL - Light Touch (to light touch) Left Lower Extremity Assessment LLE ROM/Strength/Tone: Deficits LLE ROM/Strength/Tone Deficits: Significant weakness, impaired dorsiflexion ROM.  LLE Sensation: WFL - Light Touch (to light touch) Trunk Assessment Trunk Assessment: Kyphotic   Balance Balance Balance Assessed: Yes Static Sitting Balance Static Sitting - Balance Support: Right upper extremity supported;Feet supported Static Sitting - Level of Assistance: 4: Min assist Static Sitting - Comment/# of Minutes: Pt with occasional posterior lean requiring assist to correct.   End of Session PT - End of Session Equipment Utilized During Treatment: Gait belt;Oxygen Activity Tolerance: Patient limited by fatigue;Patient limited by pain;Patient tolerated treatment well Patient left: in chair;with call bell/phone within reach Nurse Communication: Mobility status;Need for lift equipment   Wilhemina Bonito 11/02/2011, 9:43  AM  Sherie Don) Carleene Mains PT, DPT Acute Rehabilitation [phone]

## 2011-11-02 NOTE — Progress Notes (Signed)
Patient lying in bed with eyes closed awakened  to voice.  Sister at bedside.  Patient able to follow simple commands.  Consumed 230 ml of Ensure without difficulties.  Will continue to monitor.

## 2011-11-02 NOTE — Progress Notes (Signed)
Clinical Social Work Department BRIEF PSYCHOSOCIAL ASSESSMENT 11/02/2011  Patient:  [name] [name], [name] [name]     Account Number:  [id number]     Admit date:  11/01/2011  Clinical Social Worker:  Dennison Bulla  Date/Time:  11/02/2011 09:45 AM  Referred by:  Physician  Date Referred:  11/02/2011 Referred for  SNF Placement   Other Referral:   Interview type:  Patient Other interview type:   Son and dtr-in-law present    PSYCHOSOCIAL DATA Living Status:  FACILITY Admitted from facility:  [facility] Level of care:  Skilled Nursing Facility Primary support name:  [name] [name] Primary support relationship to patient:  CHILD, ADULT Degree of support available:   Strong    CURRENT CONCERNS Current Concerns  Post-Acute Placement   Other Concerns:    SOCIAL WORK ASSESSMENT / PLAN CSW received referral due to patient being admitted from facility. CSW reviewed chart and spoke with PT who recommended SNF. CSW met with patient at bedside. Son and dtr-in-law were present.    CSW introduced myself and explained role. Patient struggled with engaging during assessment due to being drowsy so CSW spoke mostly with son. Son reports that patient lives at [facility]. Son reports that patient has lived there for "a very long time" and reports that he wishes for patient to return at dc. Son reports that patient is mostly wheelchair bound but does move around with assistance. CSW explained CSW role and assistance with dc when patient is medically ready. Son and dtr-in-law agreeable to this plan. CSW contact SNF who verified that patient lives at their facility and is agreeable to patient returning to SNF at dc.    CSW completed FL2 and placed on chart for MD signature. CSW will continue to follow and will assist with dc needs.   Assessment/plan status:  Psychosocial Support/Ongoing Assessment of Needs Other assessment/ plan:   Information/referral to community resources:   Will return to SNF at  dc    PATIENT'S/FAMILY'S RESPONSE TO PLAN OF CARE: Patient struggled with participating in assessment. Family was engaged and thankful for CSW consult. Family reports no questions at this time and agreeable to admission at dc.        Coverage for Genelle Bal

## 2011-11-02 NOTE — Progress Notes (Signed)
Pt admitted to floor from ED. Pt comes from [facility]. Pt is alert to self, currently easily arouseable to touch.  Pt not able to focus when aroused. Family was available when pt was admitted at bedside. Per family pt is WC bound. Pt has stage I to right heel, dressing placed and buttock is excoriated, EPC cream applied. Pt and family oriented to unit staff and safety. Pt placed on Bed alarm. Will continue to monitor patient.

## 2011-11-02 NOTE — Progress Notes (Signed)
Subjective: Patient alert and ff commands. Family at bedside. Patient states pain controlled.  Objective: Vital signs in last 24 hours: Filed Vitals:   11/01/11 2348 11/02/11 0345 11/02/11 0726 11/02/11 0829  BP: 174/84 108/70  107/86  Pulse: 70 56  47  Temp: 98.6 F (37 C) 97.7 F (36.5 C)  98.3 F (36.8 C)  TempSrc: Oral Oral  Oral  Resp: 20 20  20   Height: 5\' 2"  (1.575 m)     Weight: 76.3 kg (168 lb 3.4 oz)     SpO2: 92% 93% 92% 97%    Intake/Output Summary (Last 24 hours) at 11/02/11 1131 Last data filed at 11/02/11 0700  Gross per 24 hour  Intake 1522.5 ml  Output    500 ml  Net 1022.5 ml    Weight change:   General: Alert, awake, oriented x3, in no acute distress. HEENT: No bruits, no goiter. Heart: Regular rate and rhythm, without murmurs, rubs, gallops. Lungs: Clear to auscultation bilaterally. Abdomen: Soft, nontender, nondistended, positive bowel sounds. Extremities: No clubbing cyanosis. Decreased erythema and warmth in BLE. Trace BLE edema    Lab Results:  Basename 11/02/11 0510 11/01/11 2111 11/01/11 1718  NA 142 -- 141  K 3.6 -- 4.3  CL 106 -- 102  CO2 25 -- 28  GLUCOSE 112* -- 80  BUN 23 -- 25*  CREATININE 1.10 1.30* --  CALCIUM 8.6 -- 9.5  MG 2.0 -- --  PHOS -- -- --    Basename 11/02/11 0510  AST 19  ALT 14  ALKPHOS 91  BILITOT 0.3  PROT 5.5*  ALBUMIN 2.4*   No results found for this basename: LIPASE:2,AMYLASE:2 in the last 72 hours  Basename 11/02/11 0510 11/01/11 2111 11/01/11 1718  WBC 9.3 11.2* --  NEUTROABS -- -- 6.3  HGB 11.8* 13.8 --  HCT 35.1* 39.8 --  MCV 96.4 95.9 --  PLT 265 278 --   No results found for this basename: CKTOTAL:3,CKMB:3,CKMBINDEX:3,TROPONINI:3 in the last 72 hours No components found with this basename: POCBNP:3 No results found for this basename: DDIMER:2 in the last 72 hours No results found for this basename: HGBA1C:2 in the last 72 hours No results found for this basename:  CHOL:2,HDL:2,LDLCALC:2,TRIG:2,CHOLHDL:2,LDLDIRECT:2 in the last 72 hours  Basename 11/01/11 2111  TSH 2.471  T4TOTAL --  T3FREE --  THYROIDAB --   No results found for this basename: VITAMINB12:2,FOLATE:2,FERRITIN:2,TIBC:2,IRON:2,RETICCTPCT:2 in the last 72 hours  Micro Results: Recent Results (from the past 240 hour(s))  MRSA PCR SCREENING     Status: Normal   Collection Time   11/02/11  2:07 AM      Component Value Range Status Comment   MRSA by PCR NEGATIVE  NEGATIVE Final     Studies/Results: Dg Chest Port 1 View  11/01/2011  *RADIOLOGY REPORT*  Clinical Data: Wheezing and cough  PORTABLE CHEST - 1 VIEW  Comparison: 07/04/2010  Findings: There is mild cardiac enlargement.  Bilateral pleural effusions are noted left greater than right.  There is pulmonary venous congestion.  Atelectasis noted in the lung bases.  IMPRESSION:  1.  Mild CHF.  Original Report Authenticated By: Rosealee Albee, M.D.    Medications:    . acetaminophen  650 mg Oral Q8H  . acyclovir  200 mg Oral Daily  . albuterol  5 mg Nebulization Once  . albuterol      . aspirin  81 mg Oral Daily  . atorvastatin  10 mg Oral Daily  . cefTRIAXone (ROCEPHIN)  IV  1 g Intravenous Once  . cefTRIAXone (ROCEPHIN)  IV  1 g Intravenous Q24H  . enoxaparin  40 mg Subcutaneous Q24H  . escitalopram  20 mg Oral Daily  . Fluticasone-Salmeterol  1 puff Inhalation Q12H  . gabapentin  100 mg Oral TID  . isosorbide dinitrate  30 mg Oral QID  . levothyroxine  25 mcg Oral Daily  . memantine  5 mg Oral BID  . naloxone      . pantoprazole  40 mg Oral Q1200  . polyethylene glycol  17 g Oral Daily  . QUEtiapine  25 mg Oral Daily  . tiotropium  18 mcg Inhalation Daily  . DISCONTD: albuterol  4 puff Inhalation Once  . DISCONTD: hydroxypropyl methylcellulose  1 drop Both Eyes Q4H    Assessment: Principal Problem:  *Renal failure (ARF), acute on chronic Active Problems:  GERD (gastroesophageal reflux disease)  Dementia   Anemia  Parkinson disease  Hyperlipidemia  Anxiety  Depression  Encephalopathy acute  Chronic pain  Cellulitis   Plan: #1 acute on chronic renal failure  Patient's last creatinine was 1.05 10/10/2011 per nursing home records. Likely a prerenal azotemia secondary to dehydration in the setting of diuretic and ARB. Patient's current creatinine is 1.50 on admission. Creatinine today is 1.10 and close to baseline. Urine sodium and a urine creatinine pending. Renal ultrasound pending. Continue Foley catheter. Continue normal saline at 75 cc per hour. Will hold patient's ARB and diuretic and follow.  #2 acute encephalopathy  Secondary to narcotic-induced in the setting of acute renal failure. Patient is status post 2 mg of Narcan and essentially back close to her baseline. Urinalysis is negative. Chest x-ray is negative for any infiltrate. Will monitor follow for now.  #3 gastroesophageal reflux disease  PPI  #4 anemia  Stable follow H&H  #5 hyperlipidemia  Continue Lipitor  #6 Parkinson's disease  Stable.  #7 depression/anxiety  Continue Lexapro.  #8 COPD  Stable. Continue Advair Spiriva. Albuterol and Atrovent nebs when necessary.  #9 chronic pain  Will hold off on patient's narcotic medications secondary to problem #2. Patient states pain is controlled. Continue scheduled Tylenol and Ultram as needed.  #10 coronary artery disease/history of CHF  Stable. Will hold patient's Lasix secondary to problem #1. Will continue patient's aspirin. Will hold patient's ARB.  #11 hypothyroidism   TSH = 2.471 Continue home dose Synthroid.  #12 questionable bilateral lower extremity cellulitis  Per sister   the patient has had this redness and slight erythema for the past several months. Clinical improvement on IV Rocephin D2/7  #13 prophylaxis  PPI for GI prophylaxis, Lovenox for DVT prophylaxis.       LOS: 1 day   Aprille Sawhney [phone] 11/02/2011, 11:31 AM

## 2011-11-02 NOTE — Progress Notes (Signed)
RT checked patients Oxygen saturation.  They were 88-90% on RA.  RT placed patient on 2L [place] sats came up to 92%.  RT will continue to monitor.

## 2011-11-02 NOTE — ED Provider Notes (Signed)
  I performed a history and physical examination of [name] [name] and discussed her management with Dr. Noe Gens.  I agree with the history, physical, assessment, and plan of care, with the following exceptions: None  This 78yo F now p/w AMS.  Although there is some consideration of excessive analgesic use, the patient has new renal failure.  She was admitted for further E/M.  I saw the ecg and agree with the interpretation.   Elyse Jarvis, MD 11/02/11 [phone]

## 2011-11-02 NOTE — Progress Notes (Signed)
Was called Per nursing that patient was unresponsive with respirations of 10 and a blood pressure 57/27 with a faint pulse. Came up to assess the patient's. Subjective: Patient not responding to verbal stimuli. Patient responding to deep sternal rub. Patient opens eyes and has no complaints. Objective  Gen.: Patient opens eyes to deep sternal rub. Patient denies any chest pain. Patient denies any shortness of breath. Patient now alert and opens eyes to verbal stimuli. Respirations: Clear to auscultation bilaterally anterior lung Chong Cardiovascular: Regular rate rhythm Abdomen: Soft nontender nondistended positive bowel sounds Extremities: No clubbing cyanosis trace edema  Assessment/plan #1 unresponsiveness Likely secondary to hypotension. Patient responds to deep sternal rub. Patient without complaints. Will give a bolus of normal saline 1 L x1 then increase IV fluids 100 cc per hour. We'll get a portable chest x-ray, we'll cycle cardiac enzymes every 8 hours x3, check a EKG, check a BMET, check a CBC, check a magnesium level, check a UA with cultures and sensitivities, check a procalcitonin level, check a lactic acid level, check a random cortisol level, check blood cultures x2. Will check vitals every 4 hours. Continue IV fluids and follow.  #2 hypotension Differential includes hypovolemia versus cardiac etiology versus infectious etiology versus adrenal insufficiency. Patient is currently afebrile. Will repeat CBC and be met. We'll cycle cardiac enzymes every 8 hours x3. Will check a UA with cultures and sensitivities. Check blood cultures x2. Check a lactic acid level. Check a pro calcitonin level. Check a EKG. Check a random cortisol level. Check a portable chest x-ray. Will give a liter bolus of normal saline. We'll check vitals every 4 hours. Increase IV fluids 100 cc per hour. Continue IV Rocephin. Follow.

## 2011-11-03 DIAGNOSIS — R197 Diarrhea, unspecified: Secondary | ICD-10-CM

## 2011-11-03 DIAGNOSIS — L03039 Cellulitis of unspecified toe: Secondary | ICD-10-CM

## 2011-11-03 DIAGNOSIS — L02619 Cutaneous abscess of unspecified foot: Secondary | ICD-10-CM

## 2011-11-03 DIAGNOSIS — N179 Acute kidney failure, unspecified: Secondary | ICD-10-CM

## 2011-11-03 DIAGNOSIS — I959 Hypotension, unspecified: Secondary | ICD-10-CM

## 2011-11-03 LAB — CARDIAC PANEL(CRET KIN+CKTOT+MB+TROPI)
CK, MB: 1.7 ng/mL (ref 0.3–4.0)
CK, MB: 2.3 ng/mL (ref 0.3–4.0)
CK, MB: 2.9 ng/mL (ref 0.3–4.0)
Relative Index: INVALID (ref 0.0–2.5)
Total CK: 38 U/L (ref 7–177)
Troponin I: <0.3 ng/mL (ref <0.30)

## 2011-11-03 LAB — BASIC METABOLIC PANEL
Calcium: 7.9 mg/dL — ABNORMAL LOW (ref 8.4–10.5)
GFR calc non Af Amer: 36 mL/min — ABNORMAL LOW (ref >90)
Glucose, Bld: 75 mg/dL (ref 70–99)
Sodium: 140 mEq/L (ref 135–145)

## 2011-11-03 LAB — URINE CULTURE
Colony Count: NO GROWTH
Culture  Setup Time: 201306222043

## 2011-11-03 MED ORDER — TRAMADOL HCL 50 MG PO TABS
25.0000 mg | ORAL_TABLET | Freq: Three times a day (TID) | ORAL | Status: DC | PRN
  Administered 2011-11-05 – 2011-11-06 (×3): 50 mg via ORAL
  Filled 2011-11-03 (×2): qty 1

## 2011-11-03 NOTE — Progress Notes (Signed)
Subjective: Patient alert and ff commands. Family at bedside. Patient states pain controlled. No complaints. No further hypotensive issues overnight.  Objective: Vital signs in last 24 hours: Filed Vitals:   11/02/11 2102 11/03/11 0500 11/03/11 0900 11/03/11 1000  BP: 120/48 128/47  118/50  Pulse: 57 57  60  Temp: 97.8 F (36.6 C) 97.8 F (36.6 C)  98.6 F (37 C)  TempSrc: Oral Oral  Oral  Resp: 16 16  20   Height:      Weight: 77 kg (169 lb 12.1 oz)     SpO2: 98% 97% 97% 98%    Intake/Output Summary (Last 24 hours) at 11/03/11 1129 Last data filed at 11/03/11 0900  Gross per 24 hour  Intake   1830 ml  Output    470 ml  Net   1360 ml    Weight change: 0.7 kg (1 lb 8.7 oz)  General: Alert, awake, oriented x3, in no acute distress. HEENT: No bruits, no goiter. Heart: Regular rate and rhythm, without murmurs, rubs, gallops. Lungs: Clear to auscultation bilaterally. Abdomen: Soft, nontender, nondistended, positive bowel sounds. Extremities: No clubbing cyanosis. Decreased erythema and warmth in BLE. 1-2 +BLE edema    Lab Results:  [facility] 11/03/11 0501 11/02/11 1352 11/02/11 0510  NA 140 139 --  K 4.4 3.4* --  CL 108 105 --  CO2 24 26 --  GLUCOSE 75 81 --  BUN 23 24* --  CREATININE 1.26* 1.42* --  CALCIUM 7.9* 7.9* --  MG -- 1.9 2.0  PHOS -- -- --    Basename 11/02/11 0510  AST 19  ALT 14  ALKPHOS 91  BILITOT 0.3  PROT 5.5*  ALBUMIN 2.4*   No results found for this basename: LIPASE:2,AMYLASE:2 in the last 72 hours  Basename 11/02/11 1352 11/02/11 0510 11/01/11 1718  WBC 9.0 9.3 --  NEUTROABS -- -- 6.3  HGB 11.1* 11.8* --  HCT 33.5* 35.1* --  MCV 97.1 96.4 --  PLT 244 265 --    Basename 11/03/11 0501 11/02/11 2029  CKTOTAL 38 45  CKMB 1.7 2.4  CKMBINDEX -- --  TROPONINI <0.30 <0.30   No components found with this basename: POCBNP:3 No results found for this basename: DDIMER:2 in the last 72 hours No results found for this basename: HGBA1C:2  in the last 72 hours No results found for this basename: CHOL:2,HDL:2,LDLCALC:2,TRIG:2,CHOLHDL:2,LDLDIRECT:2 in the last 72 hours  Basename 11/01/11 2111  TSH 2.471  T4TOTAL --  T3FREE --  THYROIDAB --   No results found for this basename: VITAMINB12:2,FOLATE:2,FERRITIN:2,TIBC:2,IRON:2,RETICCTPCT:2 in the last 72 hours  Micro Results: Recent Results (from the past 240 hour(s))  URINE CULTURE     Status: Normal   Collection Time   11/01/11  7:46 PM      Component Value Range Status Comment   Specimen Description URINE, CLEAN CATCH   Final    Special Requests NONE   Final    Culture  Setup Time [id number]   Final    Colony Count NO GROWTH   Final    Culture NO GROWTH   Final    Report Status 11/03/2011 FINAL   Final   MRSA PCR SCREENING     Status: Normal   Collection Time   11/02/11  2:07 AM      Component Value Range Status Comment   MRSA by PCR NEGATIVE  NEGATIVE Final   CULTURE, BLOOD (ROUTINE X 2)     Status: Normal (Preliminary result)   Collection Time  11/02/11  1:50 PM      Component Value Range Status Comment   Specimen Description BLOOD RIGHT ARM   Final    Special Requests BOTTLES DRAWN AEROBIC AND ANAEROBIC 10CC   Final    Culture  Setup Time [id number]   Final    Culture     Final    Value:        BLOOD CULTURE RECEIVED NO GROWTH TO DATE CULTURE WILL BE HELD FOR 5 DAYS BEFORE ISSUING A FINAL NEGATIVE REPORT   Report Status PENDING   Incomplete   CULTURE, BLOOD (ROUTINE X 2)     Status: Normal (Preliminary result)   Collection Time   11/02/11  2:00 PM      Component Value Range Status Comment   Specimen Description BLOOD RIGHT HAND   Final    Special Requests BOTTLES DRAWN AEROBIC ONLY 8CC   Final    Culture  Setup Time [id number]   Final    Culture     Final    Value:        BLOOD CULTURE RECEIVED NO GROWTH TO DATE CULTURE WILL BE HELD FOR 5 DAYS BEFORE ISSUING A FINAL NEGATIVE REPORT   Report Status PENDING   Incomplete     Studies/Results: US  Renal  11/02/2011  *RADIOLOGY REPORT*  Clinical Data: Acute renal failure.  History of hypertension and chronic kidney disease.  RENAL/URINARY TRACT ULTRASOUND COMPLETE  Comparison:  None.  Findings:  Right Kidney:  No hydronephrosis.  Marked diffuse cortical thinning.  Mildly echogenic parenchyma.  Approximate 3.8 cm diameter simple cyst arising from the mid kidney.  No significant focal parenchymal abnormality.  No shadowing calculi. Approximately 10.1 cm in length.  Left Kidney:  Atrophic.  Marked diffuse cortical thinning with echogenic parenchyma.  No focal parenchymal abnormality.  No visible shadowing calculi.  Approximately 7.6 cm in length.  Bladder:  Decompressed by Foley catheter.  Other findings:  Small right pleural effusion.  IMPRESSION:  1.  Atrophic left kidney and diffuse cortical thinning involving the right kidney with echogenic parenchyma consistent with medical renal disease. 2.  No evidence of hydronephrosis to suggest obstruction. 3.  Small right pleural effusion.  Original Report Authenticated By: Arnell Sieving, M.D.   Dg Chest Port 1 View  11/02/2011  *RADIOLOGY REPORT*  Clinical Data: Hypotension, shortness of breath.  PORTABLE CHEST - 1 VIEW  Comparison: 11/01/2011  Findings: Heart is mildly enlarged.  Bibasilar atelectasis and small bilateral pleural effusions.  No overt edema.  No acute bony abnormality.  IMPRESSION: Cardiomegaly, small bilateral effusions with bibasilar atelectasis. Improvement in interstitial edema pattern.  Original Report Authenticated By: Cyndie Chime, M.D.   Dg Chest Port 1 View  11/01/2011  *RADIOLOGY REPORT*  Clinical Data: Wheezing and cough  PORTABLE CHEST - 1 VIEW  Comparison: 07/04/2010  Findings: There is mild cardiac enlargement.  Bilateral pleural effusions are noted left greater than right.  There is pulmonary venous congestion.  Atelectasis noted in the lung bases.  IMPRESSION:  1.  Mild CHF.  Original Report Authenticated By: Rosealee Albee, M.D.    Medications:    . acetaminophen  650 mg Oral Q8H  . acyclovir  200 mg Oral Daily  . aspirin  81 mg Oral Daily  . atorvastatin  10 mg Oral Daily  . cefTRIAXone (ROCEPHIN)  IV  1 g Intravenous Q24H  . enoxaparin  30 mg Subcutaneous Q24H  . escitalopram  20 mg Oral Daily  .  Fluticasone-Salmeterol  1 puff Inhalation Q12H  . gabapentin  100 mg Oral TID  . isosorbide dinitrate  30 mg Oral QID  . levothyroxine  25 mcg Oral Daily  . memantine  5 mg Oral BID  . pantoprazole  40 mg Oral Q1200  . polyethylene glycol  17 g Oral Daily  . potassium chloride  40 mEq Oral Once  . sodium chloride  1,000 mL Intravenous Once  . sodium chloride  500 mL Intravenous Once  . tiotropium  18 mcg Inhalation Daily  . DISCONTD: enoxaparin  40 mg Subcutaneous Q24H  . DISCONTD: QUEtiapine  25 mg Oral Daily    Assessment: Principal Problem:  *Renal failure (ARF), acute on chronic Active Problems:  GERD (gastroesophageal reflux disease)  Dementia  Anemia  Parkinson disease  Hyperlipidemia  Anxiety  Depression  Encephalopathy acute  Chronic pain  Cellulitis  Hypotension   Plan: #1 acute on chronic renal failure  Patient's last creatinine was 1.05 10/10/2011 per nursing home records. Likely a prerenal azotemia secondary to dehydration in the setting of diuretic and ARB. Patient's  creatinine is 1.50 on admission. Creatinine today is 1.26 and close to baseline. Urine sodium and a urine creatinine pending. Renal ultrasound neg for hydronephrosis. Patient with hypotensive episode yesterday. D/c Foley catheter. Continue normal saline at 75 cc per hour. Will hold patient's ARB and diuretic and follow.  #2 acute encephalopathy  Secondary to narcotic-induced in the setting of acute renal failure. Patient is status post 2 mg of Narcan and essentially back close to her baseline. Urinalysis is negative. Chest x-ray is negative for any infiltrate. Will monitor follow for now. Patient was  hypotensive yesterday and unresponsive secondary to that. Hypotension responded to IVF. Patient alert and ff commands. Follow #3. Hypotension Likely secondary to hypovolemia. Cardiac enzymes which was cycled were negative x2. Lactic acid level and pleural calcitonin levels were within normal limits. Patient's blood pressure responded to IV fluids. Chest x-ray was negative. Follow. Gentle hydration with IV fluids and monitor for volume overload. #4 gastroesophageal reflux disease  PPI  #5 anemia  Stable follow H&H  #6 hyperlipidemia  Continue Lipitor  #7 Parkinson's disease  Stable.  #8 depression/anxiety  Continue Lexapro.  #9 COPD  Stable. Continue Advair Spiriva. Albuterol and Atrovent nebs when necessary.  #10 chronic pain  Will hold off on patient's narcotic medications secondary to problem #2. Patient states pain is controlled. Continue scheduled Tylenol and Ultram as needed.  #11 coronary artery disease/history of CHF  Stable. Will hold patient's Lasix secondary to problem #1. Will continue patient's aspirin. Will hold patient's ARB.  #12 hypothyroidism   TSH = 2.471 Continue home dose Synthroid.  #13 questionable bilateral lower extremity cellulitis  Per sister   the patient has had this redness and slight erythema for the past several months. Clinical improvement on IV Rocephin D3/7. #14 prophylaxis  PPI for GI prophylaxis, Lovenox for DVT prophylaxis.       LOS: 2 days   Debanhi Blaker [phone] 11/03/2011, 11:29 AM

## 2011-11-04 ENCOUNTER — Inpatient Hospital Stay (HOSPITAL_COMMUNITY): Payer: Medicare Other

## 2011-11-04 DIAGNOSIS — L03039 Cellulitis of unspecified toe: Secondary | ICD-10-CM

## 2011-11-04 DIAGNOSIS — N179 Acute kidney failure, unspecified: Secondary | ICD-10-CM

## 2011-11-04 DIAGNOSIS — I959 Hypotension, unspecified: Secondary | ICD-10-CM

## 2011-11-04 DIAGNOSIS — R197 Diarrhea, unspecified: Secondary | ICD-10-CM

## 2011-11-04 DIAGNOSIS — L02619 Cutaneous abscess of unspecified foot: Secondary | ICD-10-CM

## 2011-11-04 LAB — BASIC METABOLIC PANEL
BUN: 17 mg/dL (ref 6–23)
Calcium: 8.5 mg/dL (ref 8.4–10.5)
Creatinine, Ser: 1 mg/dL (ref 0.50–1.10)
GFR calc non Af Amer: 47 mL/min — ABNORMAL LOW (ref >90)
Glucose, Bld: 80 mg/dL (ref 70–99)
Sodium: 142 mEq/L (ref 135–145)

## 2011-11-04 LAB — CARDIAC PANEL(CRET KIN+CKTOT+MB+TROPI)
CK, MB: 2.6 ng/mL (ref 0.3–4.0)
CK, MB: 3.7 ng/mL (ref 0.3–4.0)
Relative Index: INVALID (ref 0.0–2.5)
Total CK: 62 U/L (ref 7–177)
Troponin I: <0.3 ng/mL (ref <0.30)

## 2011-11-04 MED ORDER — AMLODIPINE BESYLATE 5 MG PO TABS
5.0000 mg | ORAL_TABLET | Freq: Every day | ORAL | Status: DC
  Administered 2011-11-04 – 2011-11-05 (×2): 5 mg via ORAL
  Filled 2011-11-04 (×4): qty 1

## 2011-11-04 MED ORDER — FUROSEMIDE 10 MG/ML IJ SOLN
20.0000 mg | Freq: Once | INTRAMUSCULAR | Status: AC
  Administered 2011-11-04: 20 mg via INTRAVENOUS
  Filled 2011-11-04: qty 2

## 2011-11-04 MED ORDER — CEPHALEXIN 500 MG PO CAPS: 500.0000 mg | ORAL_CAPSULE | Freq: Four times a day (QID) | ORAL | Status: DC

## 2011-11-04 MED ORDER — HYDRALAZINE HCL 20 MG/ML IJ SOLN
10.0000 mg | Freq: Three times a day (TID) | INTRAMUSCULAR | Status: DC | PRN
  Filled 2011-11-04: qty 0.5

## 2011-11-04 MED ORDER — CEPHALEXIN 500 MG PO CAPS
500.0000 mg | ORAL_CAPSULE | Freq: Three times a day (TID) | ORAL | Status: DC
  Administered 2011-11-04 – 2011-11-06 (×6): 500 mg via ORAL
  Filled 2011-11-04 (×9): qty 1

## 2011-11-04 MED ORDER — ENOXAPARIN SODIUM 40 MG/0.4ML ~~LOC~~ SOLN
40.0000 mg | SUBCUTANEOUS | Status: DC
  Administered 2011-11-04 – 2011-11-05 (×2): 40 mg via SUBCUTANEOUS
  Filled 2011-11-04 (×3): qty 0.4

## 2011-11-04 NOTE — Progress Notes (Signed)
Subjective: Patient alert and ff commands. Family at bedside. Patient states pain controlled. No complaints. No further hypotensive issues overnight.  Objective: Vital signs in last 24 hours: Filed Vitals:   11/04/11 0902 11/04/11 1000 11/04/11 1318 11/04/11 1400  BP: 202/93 178/79 181/71 163/89  Pulse:  69 67 78  Temp:  97.8 F (36.6 C)  98.6 F (37 C)  TempSrc:  Oral  Oral  Resp:  18  16  Height:      Weight:      SpO2:  98%  100%    Intake/Output Summary (Last 24 hours) at 11/04/11 1858 Last data filed at 11/04/11 1459  Gross per 24 hour  Intake     50 ml  Output    150 ml  Net   -100 ml    Weight change: 0.2 kg (7.1 oz)  General: Alert, awake, oriented x3, in no acute distress. HEENT: No bruits, no goiter. Heart: Regular rate and rhythm, without murmurs, rubs, gallops. Lungs: Minimal basilar crackles. Abdomen: Soft, nontender, nondistended, positive bowel sounds. Extremities: No clubbing cyanosis. Decreased erythema and warmth in BLE. 1- +BLE edema    Lab Results:  Basename 11/04/11 0626 11/03/11 0501 11/02/11 1352 11/02/11 0510  NA 142 140 -- --  K 4.3 4.4 -- --  CL 110 108 -- --  CO2 23 24 -- --  GLUCOSE 80 75 -- --  BUN 17 23 -- --  CREATININE 1.00 1.26* -- --  CALCIUM 8.5 7.9* -- --  MG -- -- 1.9 2.0  PHOS -- -- -- --    Basename 11/02/11 0510  AST 19  ALT 14  ALKPHOS 91  BILITOT 0.3  PROT 5.5*  ALBUMIN 2.4*   No results found for this basename: LIPASE:2,AMYLASE:2 in the last 72 hours  Basename 11/02/11 1352 11/02/11 0510  WBC 9.0 9.3  NEUTROABS -- --  HGB 11.1* 11.8*  HCT 33.5* 35.1*  MCV 97.1 96.4  PLT 244 265    Basename 11/04/11 1219 11/04/11 0626 11/03/11 2029  CKTOTAL 73 62 61  CKMB 3.3 2.6 2.9  CKMBINDEX -- -- --  TROPONINI <0.30 <0.30 <0.30   No components found with this basename: POCBNP:3 No results found for this basename: DDIMER:2 in the last 72 hours No results found for this basename: HGBA1C:2 in the last 72  hours No results found for this basename: CHOL:2,HDL:2,LDLCALC:2,TRIG:2,CHOLHDL:2,LDLDIRECT:2 in the last 72 hours  Basename 11/01/11 2111  TSH 2.471  T4TOTAL --  T3FREE --  THYROIDAB --   No results found for this basename: VITAMINB12:2,FOLATE:2,FERRITIN:2,TIBC:2,IRON:2,RETICCTPCT:2 in the last 72 hours  Micro Results: Recent Results (from the past 240 hour(s))  URINE CULTURE     Status: Normal   Collection Time   11/01/11  7:46 PM      Component Value Range Status Comment   Specimen Description URINE, CLEAN CATCH   Final    Special Requests NONE   Final    Culture  Setup Time [id number]   Final    Colony Count NO GROWTH   Final    Culture NO GROWTH   Final    Report Status 11/03/2011 FINAL   Final   MRSA PCR SCREENING     Status: Normal   Collection Time   11/02/11  2:07 AM      Component Value Range Status Comment   MRSA by PCR NEGATIVE  NEGATIVE Final   CULTURE, BLOOD (ROUTINE X 2)     Status: Normal (Preliminary result)   Collection Time  11/02/11  1:50 PM      Component Value Range Status Comment   Specimen Description BLOOD RIGHT ARM   Final    Special Requests BOTTLES DRAWN AEROBIC AND ANAEROBIC 10CC   Final    Culture  Setup Time [id number]   Final    Culture     Final    Value:        BLOOD CULTURE RECEIVED NO GROWTH TO DATE CULTURE WILL BE HELD FOR 5 DAYS BEFORE ISSUING A FINAL NEGATIVE REPORT   Report Status PENDING   Incomplete   CULTURE, BLOOD (ROUTINE X 2)     Status: Normal (Preliminary result)   Collection Time   11/02/11  2:00 PM      Component Value Range Status Comment   Specimen Description BLOOD RIGHT HAND   Final    Special Requests BOTTLES DRAWN AEROBIC ONLY 8CC   Final    Culture  Setup Time [id number]   Final    Culture     Final    Value:        BLOOD CULTURE RECEIVED NO GROWTH TO DATE CULTURE WILL BE HELD FOR 5 DAYS BEFORE ISSUING A FINAL NEGATIVE REPORT   Report Status PENDING   Incomplete   URINE CULTURE     Status: Normal   Collection  Time   11/02/11  2:59 PM      Component Value Range Status Comment   Specimen Description URINE, CATHETERIZED   Final    Special Requests NONE   Final    Culture  Setup Time [id number]   Final    Colony Count NO GROWTH   Final    Culture NO GROWTH   Final    Report Status 11/03/2011 FINAL   Final     Studies/Results: Dg Chest Port 1 View  11/02/2011  *RADIOLOGY REPORT*  Clinical Data: Hypotension, shortness of breath.  PORTABLE CHEST - 1 VIEW  Comparison: 11/01/2011  Findings: Heart is mildly enlarged.  Bibasilar atelectasis and small bilateral pleural effusions.  No overt edema.  No acute bony abnormality.  IMPRESSION: Cardiomegaly, small bilateral effusions with bibasilar atelectasis. Improvement in interstitial edema pattern.  Original Report Authenticated By: Cyndie Chime, M.D.    Medications:    . acetaminophen  650 mg Oral Q8H  . acyclovir  200 mg Oral Daily  . amLODipine  5 mg Oral Daily  . aspirin  81 mg Oral Daily  . atorvastatin  10 mg Oral Daily  . cefTRIAXone (ROCEPHIN)  IV  1 g Intravenous Q24H  . enoxaparin  40 mg Subcutaneous Q24H  . escitalopram  20 mg Oral Daily  . Fluticasone-Salmeterol  1 puff Inhalation Q12H  . furosemide  20 mg Intravenous Once  . gabapentin  100 mg Oral TID  . isosorbide dinitrate  30 mg Oral QID  . levothyroxine  25 mcg Oral Daily  . memantine  5 mg Oral BID  . pantoprazole  40 mg Oral Q1200  . polyethylene glycol  17 g Oral Daily  . tiotropium  18 mcg Inhalation Daily  . DISCONTD: enoxaparin  30 mg Subcutaneous Q24H    Assessment: Principal Problem:  *Renal failure (ARF), acute on chronic Active Problems:  GERD (gastroesophageal reflux disease)  Dementia  Anemia  Parkinson disease  Hyperlipidemia  Anxiety  Depression  Encephalopathy acute  Chronic pain  Cellulitis  Hypotension   Plan: #1 acute on chronic renal failure  Patient's last creatinine was 1.05 10/10/2011 per nursing home records. Likely  a prerenal azotemia  secondary to dehydration in the setting of diuretic and ARB. Patient's  creatinine is 1.50 on admission. Creatinine today is 1.00 and close to baseline.  Renal ultrasound neg for hydronephrosis. Patient with hypotensive episode 2 days ago which responded to IV fluids. Saline lock IV fluids. Continue to hold patient's ARB and diuretic and follow.  #2 acute encephalopathy  Secondary to narcotic-induced in the setting of acute renal failure. Patient is status post 2 mg of Narcan and essentially back close to her baseline. Urinalysis is negative. Chest x-ray is negative for any infiltrate. Will monitor follow for now. Patient was hypotensive 2 days ago and unresponsive secondary to that. Hypotension responded to IVF. Patient alert and ff commands. Follow. Currently at baseline. #3. Hypotension Likely secondary to hypovolemia. Cardiac enzymes which was cycled were negative x2. Lactic acid level and pleural calcitonin levels were within normal limits. Patient's blood pressure responded to IV fluids. Chest x-ray was negative. Follow. Saline lock IV fluids. #4 gastroesophageal reflux disease  PPI  #5 hypertension Patient's blood pressure regimen was discontinued on admission secondary to problems #1 and 3. Patient now with elevated blood pressures. Will saline lock IV fluids. Start Norvasc 5 mg daily. #6 anemia  Stable follow H&H  #7 hyperlipidemia  Continue Lipitor  #8 Parkinson's disease  Stable.  #9 depression/anxiety  Continue Lexapro.  #10 COPD  Stable. Continue Advair Spiriva. Albuterol and Atrovent nebs when necessary.  #11 chronic pain  Will hold off on patient's narcotic medications secondary to problem #2. Patient states pain is controlled. Continue scheduled Tylenol and Ultram as needed.  #12 coronary artery disease/history of CHF  Stable. Will hold patient's Lasix secondary to problem #1. Will continue patient's aspirin. Will hold patient's ARB.  #13 hypothyroidism   TSH = 2.471 Continue  home dose Synthroid.  #14 questionable bilateral lower extremity cellulitis  Per sister   the patient has had this redness and slight erythema for the past several months. Clinical improvement on IV Rocephin D4/7. Will change IV Rocephin to oral Keflex. #14 prophylaxis  PPI for GI prophylaxis, Lovenox for DVT prophylaxis.       LOS: 3 days   Timia Casselman [phone] 11/04/2011, 6:58 PM

## 2011-11-04 NOTE — Progress Notes (Signed)
   CARE MANAGEMENT NOTE 11/04/2011  Patient:  [name] [name], [name] [name]   Account Number:  [id number]  Date Initiated:  11/04/2011  Documentation initiated by:  Darlyne Russian  Subjective/Objective Assessment:   Patient admitted with acute renal failure and cellulitis     Action/Plan:   Progression of care and discharge planning   Anticipated DC Date:  11/07/2011   Anticipated DC Plan:  SKILLED NURSING FACILITY      DC Planning Services  CM consult      Choice offered to / List presented to:             Status of service:   Medicare Important Message given?   (If response is "NO", the following Medicare IM given date [place] will be blank) Date Medicare IM given:   Date Additional Medicare IM given:    Discharge Disposition:    Per UR Regulation:  Reviewed for med. necessity/level of care/duration of stay  If discussed at Long Length of Stay Meetings, dates discussed:    Comments:  11/04/2011  1500 Darlyne Russian RN, [place] [phone] Patient lives at [facility] prior to admissions.

## 2011-11-04 NOTE — Progress Notes (Signed)
Physical Therapy Treatment Patient Details Name: [name] [name] MRN: [id number] DOB: [birth year] Today's Date: 11/04/2011 Time: 4098-1191 PT Time Calculation (min): 30 min  PT Assessment / Plan / Recommendation Comments on Treatment Session  Pt continues to make good improvement.     Follow Up Recommendations  Supervision/Assistance - 24 hour;Skilled nursing facility    Barriers to Discharge        Equipment Recommendations  Defer to next venue    Recommendations for Other Services    Frequency Min 3X/week   Plan Discharge plan remains appropriate;Frequency remains appropriate    Precautions / Restrictions Precautions Precautions: Fall Restrictions Weight Bearing Restrictions: No   Pertinent Vitals/Pain Pt c/o pain in Left hip 6-8/10. RN notified.     Mobility  Bed Mobility Bed Mobility: Right Sidelying to Sit;Rolling Right;Sit to Sidelying Right Rolling Right: 3: Mod assist Right Sidelying to Sit: 3: Mod assist Sit to Sidelying Right: 3: Mod assist Details for Bed Mobility Assistance: Assist for L LE  secondary to pain.  Cues for technique, Min assist to transition trunk to sitting.   Transfers Transfers: Sit to Stand;Stand to [employer] Transfers Sit to Stand: 3: Mod assist;4: Min assist Sit to Stand: Patient Percentage: 60% Stand to Sit: 3: Mod assist Stand to Sit: Patient Percentage: 10% Stand Pivot Transfers: 3: Mod assist Details for Transfer Assistance: Pt mobility much improved from previous session.  Pt able to take pivotal steps with cueing.   Ambulation/Gait Ambulation/Gait Assistance: Not tested (comment) Wheelchair Mobility Wheelchair Mobility: No    Exercises     PT Diagnosis:    PT Problem List:   PT Treatment Interventions:     PT Goals Acute Rehab PT Goals PT Goal Formulation: With patient Time For Goal Achievement: 11/02/11 Potential to Achieve Goals: Good Pt will Roll Supine to Right Side: with min assist PT Goal: Rolling Supine to  Right Side - Progress: Progressing toward goal Pt will Roll Supine to Left Side: with min assist PT Goal: Rolling Supine to Left Side - Progress: Progressing toward goal Pt will go Supine/Side to Sit: with min assist PT Goal: Supine/Side to Sit - Progress: Progressing toward goal Pt will go Sit to Supine/Side: with mod assist PT Goal: Sit to Supine/Side - Progress: Progressing toward goal Pt will go Sit to Stand: with mod assist PT Goal: Sit to Stand - Progress: Progressing toward goal Pt will go Stand to Sit: with mod assist PT Goal: Stand to Sit - Progress: Progressing toward goal Pt will Transfer Bed to Chair/Chair to Bed: with mod assist PT Transfer Goal: Bed to Chair/Chair to Bed - Progress: Progressing toward goal Pt will Propel Wheelchair: 51 - 150 feet;with supervision  Visit Information  Last PT Received On: 11/04/11 Assistance Needed: +2    Subjective Data  Subjective: If I sit still my hip does not hurt.     Cognition  Overall Cognitive Status: History of cognitive impairments - at baseline Area of Impairment: Memory Arousal/Alertness: Awake/alert Orientation Level: Appears intact for tasks assessed Behavior During Session: [facility] for tasks performed Current Attention Level: Sustained Memory Deficits: Decreased awareness of why she was admitted to the hospital.    Balance  Balance Balance Assessed: Yes Static Sitting Balance Static Sitting - Balance Support: Right upper extremity supported;Left upper extremity supported Static Sitting - Level of Assistance: 5: Stand by assistance Static Sitting - Comment/# of Minutes: Several minutes with no LOB>  Static Standing Balance Static Standing - Balance Support: Bilateral upper extremity supported  Static Standing - Level of Assistance: 1: +1 Total assist  End of Session PT - End of Session Equipment Utilized During Treatment: Gait belt;Oxygen Activity Tolerance: Patient limited by fatigue;Patient limited by pain;Patient  tolerated treatment well Patient left: in chair;with call bell/phone within reach Nurse Communication: Mobility status;Need for lift equipment    [name] [name] 11/04/2011, 1:25 PM [name] [name] L. [name] [name] DPT [phone]

## 2011-11-04 NOTE — Progress Notes (Signed)
Pt with elevated b/p. MD to floor and made aware. N.O to epic, will monitor. [name] [name]

## 2011-11-04 NOTE — Evaluation (Signed)
Occupational Therapy Evaluation Patient Details Name: [name] [name] MRN: [id number] DOB: Nov 23, 1917 Today's Date: 11/04/2011 Time: 7829-5621 OT Time Calculation (min): 36 min  OT Assessment / Plan / Recommendation Clinical Impression  Pleasant and cooperative 78 yr old admitted to the hospital secondary to renal failure and hypotension.  Now present with decreased independence with basic selfcare tasks and transfers compared to previous baseline.  Will benefit from acute OT needs to address these issues to return back to  Assisted Living  as independent as possible.  Feel pt will need follow-up SNF for continued rehab at discharge.    OT Assessment  Patient needs continued OT Services    Follow Up Recommendations  Skilled nursing facility (rehab at ALF)       Equipment Recommendations  Defer to next venue       Frequency  Min 2X/week    Precautions / Restrictions Precautions Precautions: Fall Restrictions Weight Bearing Restrictions: No   Pertinent Vitals/Pain O2 sats 95 % on room air.  BP elevated at 198/84 supine and increased to 202/93 in sitting.  Nursing aware    ADL  Eating/Feeding: Simulated;Set up Where Assessed - Eating/Feeding: Bed level Grooming: Performed;Wash/dry hands;Wash/dry face;Set up Where Assessed - Grooming: Unsupported sitting Upper Body Bathing: Simulated;Set up Where Assessed - Upper Body Bathing: Unsupported sitting Lower Body Bathing: Simulated;+2 Total assistance Lower Body Bathing: Patient Percentage: 30% Where Assessed - Lower Body Bathing: Supported sit to stand Upper Body Dressing: Simulated;Min guard Where Assessed - Upper Body Dressing: Unsupported sitting Lower Body Dressing: Simulated;+2 Total assistance Lower Body Dressing: Patient Percentage: 30% Where Assessed - Lower Body Dressing: Sopported sit to stand Toilet Transfer: Simulated;+1 Total assistance Toilet Transfer Method: Stand pivot Acupuncturist: Holiday representative and Hygiene: Simulated;+2 Total assistance Toileting - Architect and Hygiene: Patient Percentage: 30% Where Assessed - Engineer, mining and Hygiene: Sit to stand from 3-in-1 or toilet Tub/Shower Transfer Method: Not assessed Transfers/Ambulation Related to ADLs: Pt needs total assist for transfer from bed to bedside chair stand pivot.  Did not ambulate during session. ADL Comments: Pt needs greater assist than her baseline per report.  Demonstrates decreased ability to reach either foot for bathing or dressing tasks.  Would benefit from AE trial and education.  Dyspnea 2/4 with minimal activity but O2 sats stayed at 95 % on room air.     OT Diagnosis: Generalized weakness;Acute pain  OT Problem List: Decreased strength;Decreased activity tolerance;Impaired balance (sitting and/or standing);Decreased safety awareness;Pain;Decreased knowledge of use of DME or AE;Decreased coordination;Cardiopulmonary status limiting activity OT Treatment Interventions: Self-care/ADL training;Neuromuscular education;DME and/or AE instruction;Balance training;Patient/family education;Therapeutic activities   OT Goals Acute Rehab OT Goals OT Goal Formulation: With patient Time For Goal Achievement: 11/18/11 Potential to Achieve Goals: Good ADL Goals Pt Will Perform Lower Body Bathing: with mod assist;Sit to stand from bed;with adaptive equipment ADL Goal: Lower Body Bathing - Progress: Goal set today Pt Will Perform Lower Body Dressing: with mod assist;Sit to stand from chair;Sit to stand from bed;with adaptive equipment ADL Goal: Lower Body Dressing - Progress: Goal set today Pt Will Transfer to Toilet: Stand pivot transfer;with DME;3-in-1;with mod assist ADL Goal: Toilet Transfer - Progress: Goal set today Pt Will Perform Toileting - Clothing Manipulation: with mod assist;Sitting on 3-in-1 or toilet;Standing ADL Goal: Toileting - Clothing  Manipulation - Progress: Goal set today Pt Will Perform Toileting - Hygiene: with mod assist;Sit to stand from 3-in-1/toilet ADL Goal: Toileting - Hygiene - Progress: Goal  set today Miscellaneous OT Goals Miscellaneous OT Goal #1: Pt will transition supine to sit EOB in preparation for selfcare tasks with mod assist. OT Goal: Miscellaneous Goal #1 - Progress: Goal set today  Visit Information  Last OT Received On: 11/04/11 Assistance Needed: +2    Subjective Data  Subjective: "I think you saw me the last time I came" Patient Stated Goal: Get back to friends home as soon as she can and get back to doing things for herself with less help than she needs at this time.   Prior Functioning  Home Living Lives With: Other (Comment) (at [facility]) Available Help at Discharge: Other (Comment);Personal care attendant (at [facility]) Type of Home: Assisted living (at [facility]) Home Access: Level entry Home Layout:  (unable to retrieve from pt, no family available) Bathroom Shower/Tub: Psychologist, counselling (Roll in shower) Bathroom Toilet: Handicapped height Home Adaptive Equipment: Environmental consultant - rolling;Grab bars around toilet;Grab bars in shower (wheelchair available) Additional Comments: Pt reports she was able to stand pivot transfer to wheelchair with RW, uncertain of accuracy. Prior Function Level of Independence: Needs assistance Needs Assistance: Bathing;Dressing;Feeding;Toileting Bath: Moderate (uncertain of amount of assist needed) Dressing: Moderate (uncertain of amount ) Feeding: Supervision/set-up Toileting: Moderate Able to Take Stairs?: No Driving: No Vocation: Retired Musician: No difficulties Dominant Hand: Right    Cognition  Overall Cognitive Status: History of cognitive impairments - at baseline Area of Impairment: Memory Orientation Level: Disoriented to;Situation;Time Behavior During Session: [facility] for tasks performed Current Attention Level:  Sustained Memory Deficits: Decreased awareness of why she was admitted to the hospital.    Extremity/Trunk Assessment Right Upper Extremity Assessment RUE ROM/Strength/Tone: Deficits RUE ROM/Strength/Tone Deficits: Overall strength 3+5 throughout RUE Sensation: History of peripheral neuropathy RUE Sensation Deficits: Numbness in fingertips RUE Coordination: Deficits RUE Coordination Deficits: Pt reports difficulty with buttoning buttons. Left Upper Extremity Assessment LUE ROM/Strength/Tone: Deficits LUE ROM/Strength/Tone Deficits: Strength 3+/5 throughout. LUE Sensation: WFL - Light Touch;History of peripheral neuropathy (Pt with increased numbness in bilateral hands.) LUE Coordination: Deficits LUE Coordination Deficits: Pt reports difficulty with buttoning buttons. Trunk Assessment Trunk Assessment: Kyphotic   Mobility Bed Mobility Bed Mobility: Supine to Sit Rolling Right: 1: +1 Total assist Transfers Transfers: Sit to Stand Sit to Stand: 1: +1 Total assist Stand to Sit: 1: +1 Total assist      Balance Balance Balance Assessed: Yes Static Sitting Balance Static Sitting - Balance Support: Right upper extremity supported;Left upper extremity supported Static Sitting - Level of Assistance: 5: Stand by assistance Static Standing Balance Static Standing - Balance Support: Right upper extremity supported;Left upper extremity supported Static Standing - Level of Assistance: 1: +1 Total assist  End of Session OT - End of Session Activity Tolerance: Patient limited by fatigue Patient left: in chair;with call bell/phone within reach   [facility] OTR/L 11/04/2011, 10:05 AM Pager number [phone]

## 2011-11-04 NOTE — Progress Notes (Signed)
Pt with notable 2+ pitting edema throughout. Elevated b/p of 181/71. MD, Dr, Janee Morn, paged with callback, new orders to epic. Pt is otherwise hemodynamically stable. Will continue to monitor.

## 2011-11-05 DIAGNOSIS — R197 Diarrhea, unspecified: Secondary | ICD-10-CM

## 2011-11-05 DIAGNOSIS — L03039 Cellulitis of unspecified toe: Secondary | ICD-10-CM

## 2011-11-05 DIAGNOSIS — I959 Hypotension, unspecified: Secondary | ICD-10-CM

## 2011-11-05 DIAGNOSIS — N179 Acute kidney failure, unspecified: Secondary | ICD-10-CM

## 2011-11-05 DIAGNOSIS — L02619 Cutaneous abscess of unspecified foot: Secondary | ICD-10-CM

## 2011-11-05 LAB — CARDIAC PANEL(CRET KIN+CKTOT+MB+TROPI)
CK, MB: 3.1 ng/mL (ref 0.3–4.0)
Relative Index: 3.3 — ABNORMAL HIGH (ref 0.0–2.5)
Relative Index: 3.3 — ABNORMAL HIGH (ref 0.0–2.5)
Total CK: 88 U/L (ref 7–177)
Total CK: 101 U/L (ref 7–177)
Troponin I: <0.3 ng/mL (ref <0.30)

## 2011-11-05 LAB — BASIC METABOLIC PANEL
BUN: 13 mg/dL (ref 6–23)
Calcium: 9.1 mg/dL (ref 8.4–10.5)
GFR calc non Af Amer: 55 mL/min — ABNORMAL LOW (ref >90)
Glucose, Bld: 87 mg/dL (ref 70–99)

## 2011-11-05 MED ORDER — TRAMADOL HCL 50 MG PO TABS: 25.0000 mg | ORAL_TABLET | Freq: Three times a day (TID) | ORAL | Status: AC | PRN

## 2011-11-05 MED ORDER — AMLODIPINE BESYLATE 5 MG PO TABS: 5.0000 mg | ORAL_TABLET | Freq: Every day | ORAL | Status: DC

## 2011-11-05 MED ORDER — AMLODIPINE BESYLATE 10 MG PO TABS
10.0000 mg | ORAL_TABLET | Freq: Every day | ORAL | Status: DC
  Administered 2011-11-06: 10 mg via ORAL
  Filled 2011-11-05: qty 1

## 2011-11-05 MED ORDER — OMEPRAZOLE 20 MG PO CPDR: 40.0000 mg | DELAYED_RELEASE_CAPSULE | Freq: Every day | ORAL | Status: DC

## 2011-11-05 MED ORDER — ACETAMINOPHEN 325 MG PO TABS: 650.0000 mg | ORAL_TABLET | Freq: Three times a day (TID) | ORAL | Status: AC

## 2011-11-05 MED ORDER — AMLODIPINE BESYLATE 5 MG PO TABS
5.0000 mg | ORAL_TABLET | Freq: Once | ORAL | Status: AC
  Administered 2011-11-05: 5 mg via ORAL
  Filled 2011-11-05: qty 1

## 2011-11-05 MED ORDER — CEPHALEXIN 500 MG PO CAPS: 500.0000 mg | ORAL_CAPSULE | Freq: Three times a day (TID) | ORAL | Status: AC

## 2011-11-05 NOTE — Discharge Summary (Addendum)
Discharge Summary  [name] [name] MR#: [id number]  DOB:07/04/1917  Date of Admission: 11/01/2011 Date of Discharge: 11/05/2011  Patient's PCP: Kimber Relic, MD  Attending Physician:Emmanuell Kantz  Consults:   None  Discharge Diagnoses: Renal failure (ARF), acute on chronic Present on Admission:  .Renal failure (ARF), acute on chronic .Encephalopathy acute .GERD (gastroesophageal reflux disease) .Dementia .Anemia .Parkinson disease .Hyperlipidemia .Depression .Anxiety .Chronic pain .Cellulitis   Brief Admitting History and Physical [name] [name] is a pleasant 78 year old Caucasian female with a history of hypertension, COPD, coronary artery disease, history of CHF, history of chronic kidney disease stage II last creatinine was 1.05 10/10/2011, history of gastroesophageal reflux disease, history of dementia, history of Parkinson's disease, history of hypothyroidism. Patient is wheelchair-bound and a [facility] who presented with altered mental status. Patient's sister is at bedside and gives most of the history. History is also obtained from the ED physician's note. Per ED physician's note and the patient's sister patient has chronic pain secondary to a loose screw in her hip and is on a fentanyl patch at baseline and also Vicodin on as needed basis. Patient at the nursing home was complaining of increased pain and was given some Vicodin addition to a fentanyl patch. Patient subsequently became lethargic and EMS was called. Patient was seen per EMS fentanyl patch was removed the patient was given 2 mg of Narcan with improvement in her mentation. Patient was subsequently brought to the ED. Patient denies any fevers, no chills, no nausea, no vomiting, no chest pain, no shortness of breath, patient does endorse a productive cough of whitish sputum, also endorses some wheezing, some generalized weakness, and some shortness of breath. Patient denies any diarrhea, no  constipation, no dysuria, patient does endorse some decreased urine output on the day of admission. Patient also states she has good oral intake. Patient was seen in the ED chest x-ray which was done had some concerns for CHF however clinically patient will try. Labs obtained did show a creatinine of 1.50 and then a BUN of 25. Otherwise rest of the labs were within normal limits. Will call to admit the patient for an acute renal failure. For the rest of admission history and physical please see H&P dictated by Dr. Janee Morn.  Discharge Medications Medication List  As of 11/05/2011  2:55 PM   START taking these medications         acetaminophen 325 MG tablet   Commonly known as: TYLENOL   Take 2 tablets (650 mg total) by mouth every 8 (eight) hours.      amLODipine 5 MG tablet   Commonly known as: NORVASC   Take 1 tablet (5 mg total) by mouth daily.      cephALEXin 500 MG capsule   Commonly known as: KEFLEX   Take 1 capsule (500 mg total) by mouth every 8 (eight) hours. Take for 3 days then stop.         CHANGE how you take these medications         omeprazole 20 MG capsule   Commonly known as: PRILOSEC   Take 2 capsules (40 mg total) by mouth daily.   What changed: dose      traMADol 50 MG tablet   Commonly known as: ULTRAM   Take 0.5-1 tablets (25-50 mg total) by mouth every 8 (eight) hours as needed.   What changed: - dose - how often to take the med - reasons to take the med - doctor's  instructions         CONTINUE taking these medications         acyclovir 200 MG capsule   Commonly known as: ZOVIRAX      aspirin 81 MG tablet      atorvastatin 10 MG tablet   Commonly known as: LIPITOR      calcium-vitamin D 500-200 MG-UNIT per tablet   Commonly known as: OSCAL WITH D      escitalopram 20 MG tablet   Commonly known as: LEXAPRO      Fluticasone-Salmeterol 100-50 MCG/DOSE Aepb   Commonly known as: ADVAIR      gabapentin 100 MG capsule   Commonly known as:  NEURONTIN      hydroxypropyl methylcellulose 2.5 % ophthalmic solution   Commonly known as: ISOPTO TEARS      isosorbide dinitrate 30 MG tablet   Commonly known as: ISORDIL      levothyroxine 25 MCG tablet   Commonly known as: SYNTHROID, LEVOTHROID      memantine 5 MG tablet   Commonly known as: NAMENDA      polyethylene glycol packet   Commonly known as: MIRALAX / GLYCOLAX      tiotropium 18 MCG inhalation capsule   Commonly known as: SPIRIVA         STOP taking these medications         fentaNYL 25 MCG/HR      furosemide 40 MG tablet      HYDROcodone-acetaminophen 5-325 MG per tablet      losartan 50 MG tablet      QUEtiapine 25 MG tablet          Where to get your medications    These are the prescriptions that you need to pick up.   You may get these medications from any pharmacy.         acetaminophen 325 MG tablet   amLODipine 5 MG tablet   cephALEXin 500 MG capsule   omeprazole 20 MG capsule   traMADol 50 MG tablet           Hospital Course: #1. Renal failure (ARF), acute on chronic On admission patient was noted to have a creatinine of 1.5. Patient's last creatinine per nursing home records was 1.05 10/10/2011. Was felt patient had a prerenal azotemia secondary to dehydration in the setting of diuretic therapy and ARB. Patient was admitted to a MedSurg floor. Urine studies were obtained. Renal ultrasound was also obtained which was negative for any hydronephrosis. A Foley catheter was initially placed and patient was hydrated gently with IV fluids. Patient's ARB and diuretic were held throughout the hospitalization. Patient was monitored patient's renal function improved steadily on a daily basis. However on 11/02/2011 patient was noted to be unresponsive with a systolic blood pressure 57. Patient was hydrated with IV fluids a septic workup was undertaken which was negative cardiac workup was also undertaken which was negative and patient's blood pressure  responded to fluids. However due to this hypotension patient's creatinine fluctuated and then subsequently improved with fluid resuscitation such that by day of discharge patient was back at her baseline. On day of discharge patient's creatinine was 1.00. Patient will be discharged off of her diuretic therapy an ARB. She was placed on Norvasc for blood pressure control. Patient's renal function will be reassessed per M.D. at the skilled nursing facility. Patient will need a repeat basic metabolic profile done one week post discharge to followup on her renal function. Will be deferred  to the M.D. at the skilled nursing facility as to when and whether to resume her diuretic therapy and the ARB. Patient was discharged in stable and improved condition.  Problem #2 acute encephalopathy Patient had presented to the hospital secondary to an acute encephalopathy with lethargy and unresponsiveness. Patient was given Narcan on route per EMS and woke up on was immediately and was back to her baseline. Urinalysis which was done was negative for UTI. Chest x-ray which was done was negative for any acute infiltrate. Patient remained afebrile throughout the hospitalization. Patient improved and was at her baseline mentally throughout the whole hospitalization and it was felt patient's acute encephalopathy was narcotic induced in the setting of acute renal failure. Patient's fentanyl patch was discontinued as well as her Vicodin during this hospitalization. Patient's pain was controlled on scheduled Tylenol and Ultram on an as needed basis. Patient be discharged back in stable and improved condition on scheduled Tylenol and Ultram as needed.  #3 probable bilateral lower extremity cellulitis On admission it was noted that patient likely did have a bilateral lower extremity cellulitis as she did have erythema bilaterally with some warmth, 1+ edema, erythema. Patient was placed empirically on IV Rocephin with good clinical  response. Patient was subsequently transitioned to oral Keflex and will continue for 3 more days to complete a one-week course of antibiotic therapy. Patient was discharged in stable and improved condition.  #4 hypotension During the hospitalization on 11/02/2011 patient was noted to be unresponsive with a systolic blood pressure 57. It was felt this was likely secondary to hypovolemia. An infectious workup was undertaken which was negative. Urinalysis which was done and chest x-ray were both negative. Blood cultures were obtained with no growth to date. Patient was fluid resuscitated a systolic blood pressure improved and her unresponsiveness resolved. Patient was subsequently saline lock and her blood pressure remained stable. It was felt that hypotension was secondary to hypovolemia. Patient be discharged in stable and improved condition.  #5 hypertension On admission patient was noted to be in acute renal failure secondary to prerenal azotemia the setting of ARB and diuretic therapy. Patient's ARB and Lasix were discontinued throughout the hospitalization. Patient was fluid resuscitated with IV fluids. Once patient was euvolemic IV fluids with saline lock. However patient's blood pressure started to become elevated. Patient was subsequently started on Norvasc 5 mg daily with improvement in his systolic blood pressure. On day of discharge patient's blood pressure was 151/50. To be discharged home on Norvasc 5 mg daily with further titration per M.D. at the skilled nursing facility.  The rest of patient's chronic medical issues remained stable throughout the hospitalization and patient be discharged in stable and improved condition.   Present on Admission:  .Renal failure (ARF), acute on chronic .Encephalopathy acute .GERD (gastroesophageal reflux disease) .Dementia .Anemia .Parkinson disease .Hyperlipidemia .Depression .Anxiety .Chronic pain .Cellulitis   Day of Discharge BP 151/50   Pulse 83  Temp 98.4 F (36.9 C) (Oral)  Resp 20  Ht 5\' 2"  (1.575 m)  Wt 77.2 kg (170 lb 3.1 oz)  BMI 31.13 kg/m2  SpO2 90% Subjective: No complaints. Feels great. Was to go back home. General: Alert, awake, oriented x3, in no acute distress. HEENT: No bruits, no goiter. Heart: Regular rate and rhythm, without murmurs, rubs, gallops. Lungs: Clear to auscultation bilaterally. Abdomen: Soft, nontender, nondistended, positive bowel sounds. Extremities: No clubbing cyanosis trace bilateral lower extremity edema, erythema resolved, no warmth, nontender to palpation, with positive pedal pulses.  Results for orders placed during the hospital encounter of 11/01/11 (from the past 48 hour(s))  CARDIAC PANEL(CRET KIN+CKTOT+MB+TROPI)     Status: Normal   Collection Time   11/03/11  8:29 PM      Component Value Range Comment   Total CK 61  7 - 177 U/L    CK, MB 2.9  0.3 - 4.0 ng/mL    Troponin I <0.30  <0.30 ng/mL    Relative Index RELATIVE INDEX IS INVALID  0.0 - 2.5   CARDIAC PANEL(CRET KIN+CKTOT+MB+TROPI)     Status: Normal   Collection Time   11/04/11  6:26 AM      Component Value Range Comment   Total CK 62  7 - 177 U/L    CK, MB 2.6  0.3 - 4.0 ng/mL    Troponin I <0.30  <0.30 ng/mL    Relative Index RELATIVE INDEX IS INVALID  0.0 - 2.5   BASIC METABOLIC PANEL     Status: Abnormal   Collection Time   11/04/11  6:26 AM      Component Value Range Comment   Sodium 142  135 - 145 mEq/L    Potassium 4.3  3.5 - 5.1 mEq/L    Chloride 110  96 - 112 mEq/L    CO2 23  19 - 32 mEq/L    Glucose, Bld 80  70 - 99 mg/dL    BUN 17  6 - 23 mg/dL    Creatinine, Ser 1.61  0.50 - 1.10 mg/dL    Calcium 8.5  8.4 - 09.6 mg/dL    GFR calc non Af Amer 47 (*) >90 mL/min    GFR calc Af Amer 55 (*) >90 mL/min   CARDIAC PANEL(CRET KIN+CKTOT+MB+TROPI)     Status: Normal   Collection Time   11/04/11 12:19 PM      Component Value Range Comment   Total CK 73  7 - 177 U/L    CK, MB 3.3  0.3 - 4.0 ng/mL     Troponin I <0.30  <0.30 ng/mL    Relative Index RELATIVE INDEX IS INVALID  0.0 - 2.5   CARDIAC PANEL(CRET KIN+CKTOT+MB+TROPI)     Status: Normal   Collection Time   11/04/11  9:17 PM      Component Value Range Comment   Total CK 90  7 - 177 U/L    CK, MB 3.7  0.3 - 4.0 ng/mL    Troponin I <0.30  <0.30 ng/mL    Relative Index RELATIVE INDEX IS INVALID  0.0 - 2.5   CARDIAC PANEL(CRET KIN+CKTOT+MB+TROPI)     Status: Normal   Collection Time   11/05/11  4:45 AM      Component Value Range Comment   Total CK 88  7 - 177 U/L    CK, MB 3.1  0.3 - 4.0 ng/mL    Troponin I <0.30  <0.30 ng/mL    Relative Index RELATIVE INDEX IS INVALID  0.0 - 2.5   BASIC METABOLIC PANEL     Status: Abnormal   Collection Time   11/05/11  4:45 AM      Component Value Range Comment   Sodium 140  135 - 145 mEq/L    Potassium 3.8  3.5 - 5.1 mEq/L    Chloride 105  96 - 112 mEq/L    CO2 27  19 - 32 mEq/L    Glucose, Bld 87  70 - 99 mg/dL    BUN 13  6 -  23 mg/dL    Creatinine, Ser 1.30  0.50 - 1.10 mg/dL    Calcium 9.1  8.4 - 86.5 mg/dL    GFR calc non Af Amer 55 (*) >90 mL/min    GFR calc Af Amer 63 (*) >90 mL/min   CARDIAC PANEL(CRET KIN+CKTOT+MB+TROPI)     Status: Abnormal   Collection Time   11/05/11 12:00 PM      Component Value Range Comment   Total CK 101  7 - 177 U/L    CK, MB 3.3  0.3 - 4.0 ng/mL    Troponin I <0.30  <0.30 ng/mL    Relative Index 3.3 (*) 0.0 - 2.5     US Renal  11/02/2011  *RADIOLOGY REPORT*  Clinical Data: Acute renal failure.  History of hypertension and chronic kidney disease.  RENAL/URINARY TRACT ULTRASOUND COMPLETE  Comparison:  None.  Findings:  Right Kidney:  No hydronephrosis.  Marked diffuse cortical thinning.  Mildly echogenic parenchyma.  Approximate 3.8 cm diameter simple cyst arising from the mid kidney.  No significant focal parenchymal abnormality.  No shadowing calculi. Approximately 10.1 cm in length.  Left Kidney:  Atrophic.  Marked diffuse cortical thinning with  echogenic parenchyma.  No focal parenchymal abnormality.  No visible shadowing calculi.  Approximately 7.6 cm in length.  Bladder:  Decompressed by Foley catheter.  Other findings:  Small right pleural effusion.  IMPRESSION:  1.  Atrophic left kidney and diffuse cortical thinning involving the right kidney with echogenic parenchyma consistent with medical renal disease. 2.  No evidence of hydronephrosis to suggest obstruction. 3.  Small right pleural effusion.  Original Report Authenticated By: Arnell Sieving, M.D.   Dg Chest Port 1 View  11/04/2011  *RADIOLOGY REPORT*  Clinical Data: Shortness of breath, wheezing  PORTABLE CHEST - 1 VIEW  Comparison: 11/02/2011  Findings: Cardiomegaly again noted.  Persistent small bilateral pleural effusion with bilateral basilar atelectasis.  No pulmonary edema.  Metallic fixation plate cervical spine.  IMPRESSION: Persistent small bilateral pleural effusion with bilateral basilar atelectasis.  No pulmonary edema.  Cardiomegaly again noted.  Original Report Authenticated By: Natasha Mead, M.D.   Dg Chest Port 1 View  11/02/2011  *RADIOLOGY REPORT*  Clinical Data: Hypotension, shortness of breath.  PORTABLE CHEST - 1 VIEW  Comparison: 11/01/2011  Findings: Heart is mildly enlarged.  Bibasilar atelectasis and small bilateral pleural effusions.  No overt edema.  No acute bony abnormality.  IMPRESSION: Cardiomegaly, small bilateral effusions with bibasilar atelectasis. Improvement in interstitial edema pattern.  Original Report Authenticated By: Cyndie Chime, M.D.   Dg Chest Port 1 View  11/01/2011  *RADIOLOGY REPORT*  Clinical Data: Wheezing and cough  PORTABLE CHEST - 1 VIEW  Comparison: 07/04/2010  Findings: There is mild cardiac enlargement.  Bilateral pleural effusions are noted left greater than right.  There is pulmonary venous congestion.  Atelectasis noted in the lung bases.  IMPRESSION:  1.  Mild CHF.  Original Report Authenticated By: Rosealee Albee, M.D.      Disposition: SNF  Diet: Regular/mechanical soft  Activity: Increase activity slowly   Follow-up Appts: Discharge Orders    Future Orders Please Complete By Expires   Diet general      Comments:   Mechanical soft   Increase activity slowly      Discharge instructions      Comments:   Follow up with MD at SNF      TESTS THAT NEED FOLLOW-UP BMET in 1 week  Time spent  on discharge, talking to the patient, and coordinating care: 50 mins.   Signed: Kaeleb Emond [phone] 11/05/2011, 2:55 PM   QTC prolongation During the hospitalization EKG which was obtained when patient became hypotensive did show a QTC prolongation of 514. Due to that patient's Seroquel was discontinued. Repeat EKG showed an improved QTC of 468. Will defer to M.D. at the skilled nursing facility as to when or whether to resume patient's Seroquel.

## 2011-11-05 NOTE — Clinical Social Work Note (Signed)
Patient medically stable for discharge back to [facility] skilled. Facility will determine if ST rehab  Needed before returning to her regular LTC bed. Discharge paperwork faxed to SW/Admissions staff and CSW facilitated transport to [facility] via ambulance. Patients daughter in the room, is aware of discharge and contacted her brother Nishka Heide to make him aware of discharge.  Genelle Bal, MSW, LCSW [phone]

## 2011-11-05 NOTE — Progress Notes (Signed)
Physical Therapy Treatment Patient Details Name: [name] [name] MRN: [id number] DOB: 1917-07-25 Today's Date: 11/05/2011 Time: 4098-1191 PT Time Calculation (min): 26 min  PT Assessment / Plan / Recommendation Comments on Treatment Session  Continuing improvements; was able to take steps and initiate gait training today    Follow Up Recommendations  Skilled nursing facility;Supervision/Assistance - 24 hour    Barriers to Discharge        Equipment Recommendations  Defer to next venue    Recommendations for Other Services    Frequency Min 3X/week   Plan Discharge plan remains appropriate;Frequency remains appropriate    Precautions / Restrictions Precautions Precautions: Fall   Pertinent Vitals/Pain Some Left hip pain, though not rated; repositioned as comfortably as possible in the chair    Mobility  Bed Mobility Bed Mobility: Supine to Sit;Sitting - Scoot to Edge of Bed Supine to Sit: 3: Mod assist;With rails;HOB flat (light mod assist) Sitting - Scoot to Edge of Bed: 3: Mod assist;With rail Details for Bed Mobility Assistance: Good initiation of movement and initial scoot of hips to EOB; physcal assist to transition trunk to sitting Transfers Transfers: Sit to Stand;Stand to Sit Sit to Stand: 3: Mod assist;4: Min assist Stand to Sit: 4: Min assist;With armrests;With upper extremity assist;To chair/3-in-1 Details for Transfer Assistance: Continuing improvements win transfers; Requires cues for hand placement and safety Ambulation/Gait Ambulation/Gait Assistance: 3: Mod assist Ambulation Distance (Feet): 5 Feet Assistive device: Rolling walker Ambulation/Gait Assistance Details: Mod assist mostly for RW maneuvering and management; cues for gait sequence; pt had difficulty processing the need to step sideways to line up with seated surface Gait Pattern: Decreased step length - right;Decreased step length - left    Exercises     PT Diagnosis:    PT Problem List:   PT  Treatment Interventions:     PT Goals Acute Rehab PT Goals Time For Goal Achievement: 11/19/11 Potential to Achieve Goals: Good Pt will Roll Supine to Right Side: with supervision PT Goal: Rolling Supine to Right Side - Progress: Updated due to goal met Pt will Roll Supine to Left Side: with supervision PT Goal: Rolling Supine to Left Side - Progress: Updated due to goal met Pt will go Supine/Side to Sit: with supervision PT Goal: Supine/Side to Sit - Progress: Updated due to goal met Pt will go Sit to Supine/Side: with min assist PT Goal: Sit to Supine/Side - Progress: Goal set today Pt will go Sit to Stand: with supervision PT Goal: Sit to Stand - Progress: Updated due to goal met Pt will go Stand to Sit: with supervision PT Goal: Stand to Sit - Progress: Updated due to goals met Pt will Transfer Bed to Chair/Chair to Bed: with min assist PT Transfer Goal: Bed to Chair/Chair to Bed - Progress: Updated due to goal met Pt will Ambulate: 51 - 150 feet;with min assist;with least restrictive assistive device PT Goal: Ambulate - Progress: Goal set today Pt will Propel Wheelchair: 51 - 150 feet;with supervision PT Goal: Propel Wheelchair - Progress:  (goal continues to be appropriate)  Visit Information  Last PT Received On: 11/05/11 Assistance Needed: +2    Subjective Data  Subjective: Reports eminent need for [name] [name] Patient Stated Goal: Feel better   Cognition  Overall Cognitive Status: History of cognitive impairments - at baseline Area of Impairment: Memory Arousal/Alertness: Awake/alert Orientation Level: Appears intact for tasks assessed Behavior During Session: [facility] for tasks performed    Balance     End of Session  PT - End of Session Equipment Utilized During Treatment: Gait belt;Oxygen Activity Tolerance: Patient tolerated treatment well Patient left: in chair;with chair alarm set;with call bell/phone within reach Nurse Communication: Mobility status    [name] [address] [address], [place] [phone]   11/05/2011, 12:38 PM

## 2011-11-05 NOTE — Progress Notes (Signed)
Pt still here on floor. BP 199/91. Dr. Janee Morn notified. Orders to give 1800 dose of Isosorbide and recheck BP.

## 2011-11-05 NOTE — Discharge Summary (Signed)
Pt discharged to [facility]. Report called to [place]. Iv d/c"d, tele. Box removed, belongings with pt. And family at bedside. Vital signs stable.

## 2011-11-05 NOTE — Progress Notes (Signed)
Pt BP 180/82. Doctor Janee Morn notified. Orders to keep pt and cancel discharge. Heart Monitor placed back on pt.

## 2011-11-05 NOTE — Progress Notes (Signed)
Was told per nursing that patient was waiting for ambulance and was awaiting the nurse rechecked her blood pressure and came back elevated at 199/91. Nurse had stated that patient was totally to receive her Imdur. Nurse was asked to recheck blood pressure manually and came back with a systolic of 180. Will cancel discharge and monitor over the next 24 hours for better blood pressure control. Will increase patient's Norvasc to 10 mg daily. We'll monitor and titrate her blood pressure medications accordingly.

## 2011-11-06 DIAGNOSIS — L02619 Cutaneous abscess of unspecified foot: Secondary | ICD-10-CM

## 2011-11-06 DIAGNOSIS — R197 Diarrhea, unspecified: Secondary | ICD-10-CM

## 2011-11-06 DIAGNOSIS — L03039 Cellulitis of unspecified toe: Secondary | ICD-10-CM

## 2011-11-06 DIAGNOSIS — N179 Acute kidney failure, unspecified: Secondary | ICD-10-CM

## 2011-11-06 LAB — CARDIAC PANEL(CRET KIN+CKTOT+MB+TROPI)
CK, MB: 2.5 ng/mL (ref 0.3–4.0)
Relative Index: INVALID (ref 0.0–2.5)
Total CK: 87 U/L (ref 7–177)
Troponin I: <0.3 ng/mL (ref <0.30)
Troponin I: <0.3 ng/mL (ref <0.30)

## 2011-11-06 NOTE — Progress Notes (Addendum)
[facility] PROGRESS NOTE  [name] [name]:[id number] DOB: May 28, 1917 DOA: 11/01/2011 PCP: Kimber Relic, MD  Assessment/Plan: 1. Acute on chronic renal failure: Resolved. Likely prerenal azotemia secondary to dehydration the setting of diuretic therapy. Renal ultrasound unremarkable. 2. Acute encephalopathy: Resolved. Narcotic induced. Resolved with 2 mg of Narcan. No recurrence. 3. Chronic hip pain: Stable on Tylenol and Ultram only. Fentanyl patch and narcotics discontinued. 4. Bilateral lower extreme cellulitis: Nearly resolved. Continue Keflex. 5. Hypertension: Stable.  6. COPD: Stable. Continue Advair, Spiriva. 7. Parkinson's disease: Stable. 8. Dementia: Stable. Wheelchair-bound, nursing home resident.  Discharge was held 6/25 because of hypertension. Hypertension much improved. Stable for discharge today. Dr. Carollee Massed discharge summary reviewed. No changes.  Code Status: DO NOT RESUSCITATE Family Communication: Discussed with sister bedside. Disposition Plan: Transfer to skilled nursing facility today.  Brendia Sacks, MD  [facility] Pager [phone]. If 8PM-8AM, please contact night-coverage at [email or web address], password [facility] 11/06/2011, 2:31 PM  LOS: 5 days   Brief narrative: 78 year old woman presented to the emergency department with lethargy. History of chronic pain on fentanyl patch. Was given Vicodin and became lethargic. Lethargy immediately resolved with administration of Narcan. Was found to have acute renal failure and admitted.  Patient was admitted, placed on IV fluids and empiric antibiotic therapy for bilateral lower extremity cellulitis. Cellulitis appears nearly resolved. Her mental status remains stable and her clinical condition was noted to improve. However during her hospitalization she episode of unresponsiveness and hypotension which quickly resolved with IV fluids. On the day of discharge she is known to be markedly hypertensive and  discharge was held.  Consultants:  Physical therapy: Skilled nursing facility.  Occupational therapy: Skilled nursing facility  Procedures:  None  HPI/Subjective: No complaints. Discussed with nurse--no issues.  Objective: Filed Vitals:   11/05/11 2111 11/06/11 0459 11/06/11 1000 11/06/11 1400  BP:  160/75 167/93 124/85  Pulse:  78 80 77  Temp:  98.1 F (36.7 C) 98.4 F (36.9 C) 98 F (36.7 C)  TempSrc:  Oral Oral Oral  Resp:  18 16 16   Height:      Weight:      SpO2: 93% 86% 91% 90%    Intake/Output Summary (Last 24 hours) at 11/06/11 1431 Last data filed at 11/06/11 1300  Gross per 24 hour  Intake    300 ml  Output    275 ml  Net     25 ml    Exam:   General:  Appears calm and comfortable. Sitting in chair.  Cardiovascular: Regular rate and rhythm. No murmur, rub, gallop. 1+ bilateral lower extremity edema.  Respiratory: Clear to auscultation bilaterally. No wheezes, rales, rhonchi. Normal respiratory effort.  Skin: Minimal erythema bilateral lower legs. Nontender. No fluctuance.  Data Reviewed: Basic Metabolic Panel:  Lab 11/05/11 8657 11/04/11 0626 11/03/11 0501 11/02/11 1352 11/02/11 0510  NA 140 142 140 139 142  K 3.8 4.3 4.4 3.4* 3.6  CL 105 110 108 105 106  CO2 27 23 24 26 25   GLUCOSE 87 80 75 81 112*  BUN 13 17 23  24* 23  CREATININE 0.89 1.00 1.26* 1.42* 1.10  CALCIUM 9.1 8.5 7.9* 7.9* 8.6  MG -- -- -- 1.9 2.0  PHOS -- -- -- -- --   Liver Function Tests:  Lab 11/02/11 0510  AST 19  ALT 14  ALKPHOS 91  BILITOT 0.3  PROT 5.5*  ALBUMIN 2.4*   CBC:  Lab 11/02/11 1352 11/02/11 0510 11/01/11 2111 11/01/11 1718  WBC 9.0 9.3 11.2* 10.3  NEUTROABS -- -- -- 6.3  HGB 11.1* 11.8* 13.8 14.7  HCT 33.5* 35.1* 39.8 44.4  MCV 97.1 96.4 95.9 98.2  PLT 244 265 278 276   Cardiac Enzymes:  Lab 11/06/11 1243 11/06/11 0510 11/05/11 2028 11/05/11 1200 11/05/11 0445  CKTOTAL 90 87 116 101 88  CKMB 2.6 2.5 3.8 3.3 3.1  CKMBINDEX -- -- -- -- --    TROPONINI <0.30 <0.30 <0.30 <0.30 <0.30   CBG:  Lab 11/01/11 1806  GLUCAP 83    Recent Results (from the past 240 hour(s))  URINE CULTURE     Status: Normal   Collection Time   11/01/11  7:46 PM      Component Value Range Status Comment   Specimen Description URINE, CLEAN CATCH   Final    Special Requests NONE   Final    Culture  Setup Time [id number]   Final    Colony Count NO GROWTH   Final    Culture NO GROWTH   Final    Report Status 11/03/2011 FINAL   Final   MRSA PCR SCREENING     Status: Normal   Collection Time   11/02/11  2:07 AM      Component Value Range Status Comment   MRSA by PCR NEGATIVE  NEGATIVE Final   CULTURE, BLOOD (ROUTINE X 2)     Status: Normal (Preliminary result)   Collection Time   11/02/11  1:50 PM      Component Value Range Status Comment   Specimen Description BLOOD RIGHT ARM   Final    Special Requests BOTTLES DRAWN AEROBIC AND ANAEROBIC 10CC   Final    Culture  Setup Time [id number]   Final    Culture     Final    Value:        BLOOD CULTURE RECEIVED NO GROWTH TO DATE CULTURE WILL BE HELD FOR 5 DAYS BEFORE ISSUING A FINAL NEGATIVE REPORT   Report Status PENDING   Incomplete   CULTURE, BLOOD (ROUTINE X 2)     Status: Normal (Preliminary result)   Collection Time   11/02/11  2:00 PM      Component Value Range Status Comment   Specimen Description BLOOD RIGHT HAND   Final    Special Requests BOTTLES DRAWN AEROBIC ONLY 8CC   Final    Culture  Setup Time [id number]   Final    Culture     Final    Value:        BLOOD CULTURE RECEIVED NO GROWTH TO DATE CULTURE WILL BE HELD FOR 5 DAYS BEFORE ISSUING A FINAL NEGATIVE REPORT   Report Status PENDING   Incomplete   URINE CULTURE     Status: Normal   Collection Time   11/02/11  2:59 PM      Component Value Range Status Comment   Specimen Description URINE, CATHETERIZED   Final    Special Requests NONE   Final    Culture  Setup Time [id number]   Final    Colony Count NO GROWTH   Final     Culture NO GROWTH   Final    Report Status 11/03/2011 FINAL   Final     Studies: US Renal  11/02/2011  *RADIOLOGY REPORT*  Clinical Data: Acute renal failure.  History of hypertension and chronic kidney disease.  RENAL/URINARY TRACT ULTRASOUND COMPLETE  Comparison:  None.  Findings:  Right Kidney:  No hydronephrosis.  Marked diffuse cortical  thinning.  Mildly echogenic parenchyma.  Approximate 3.8 cm diameter simple cyst arising from the mid kidney.  No significant focal parenchymal abnormality.  No shadowing calculi. Approximately 10.1 cm in length.  Left Kidney:  Atrophic.  Marked diffuse cortical thinning with echogenic parenchyma.  No focal parenchymal abnormality.  No visible shadowing calculi.  Approximately 7.6 cm in length.  Bladder:  Decompressed by Foley catheter.  Other findings:  Small right pleural effusion.  IMPRESSION:  1.  Atrophic left kidney and diffuse cortical thinning involving the right kidney with echogenic parenchyma consistent with medical renal disease. 2.  No evidence of hydronephrosis to suggest obstruction. 3.  Small right pleural effusion.  Original Report Authenticated By: Arnell Sieving, M.D.   Dg Chest Port 1 View  11/04/2011  *RADIOLOGY REPORT*  Clinical Data: Shortness of breath, wheezing  PORTABLE CHEST - 1 VIEW  Comparison: 11/02/2011  Findings: Cardiomegaly again noted.  Persistent small bilateral pleural effusion with bilateral basilar atelectasis.  No pulmonary edema.  Metallic fixation plate cervical spine.  IMPRESSION: Persistent small bilateral pleural effusion with bilateral basilar atelectasis.  No pulmonary edema.  Cardiomegaly again noted.  Original Report Authenticated By: Natasha Mead, M.D.   Scheduled Meds:   . acetaminophen  650 mg Oral Q8H  . acyclovir  200 mg Oral Daily  . amLODipine  10 mg Oral Daily  . amLODipine  5 mg Oral Once  . aspirin  81 mg Oral Daily  . atorvastatin  10 mg Oral Daily  . cephALEXin  500 mg Oral Q8H  . enoxaparin  40 mg  Subcutaneous Q24H  . escitalopram  20 mg Oral Daily  . Fluticasone-Salmeterol  1 puff Inhalation Q12H  . gabapentin  100 mg Oral TID  . isosorbide dinitrate  30 mg Oral QID  . levothyroxine  25 mcg Oral Daily  . memantine  5 mg Oral BID  . pantoprazole  40 mg Oral Q1200  . polyethylene glycol  17 g Oral Daily  . tiotropium  18 mcg Inhalation Daily  . DISCONTD: amLODipine  5 mg Oral Daily   Continuous Infusions:   Principal Problem:  *Renal failure (ARF), acute on chronic Active Problems:  GERD (gastroesophageal reflux disease)  Dementia  Anemia  Parkinson disease  Hyperlipidemia  Anxiety  Depression  Encephalopathy acute  Chronic pain  Cellulitis  Hypotension   Time: 25 minutes

## 2011-11-06 NOTE — Progress Notes (Signed)
Physical Therapy Treatment Patient Details Name: [name] [name] MRN: [id number] DOB: 02/29/1918 Today's Date: 11/06/2011 Time: 4098-1191 PT Time Calculation (min): 9 min  PT Assessment / Plan / Recommendation Comments on Treatment Session  Pt hopeful to go back to [facility] (SNF level) soon    Follow Up Recommendations  Skilled nursing facility;Supervision/Assistance - 24 hour    Barriers to Discharge        Equipment Recommendations  Defer to next venue    Recommendations for Other Services    Frequency Min 3X/week   Plan Discharge plan remains appropriate;Frequency remains appropriate    Precautions / Restrictions Precautions Precautions: Fall Restrictions Weight Bearing Restrictions: No   Pertinent Vitals/Pain no apparent distress Some dyspnea with activity; pt reports this is long-standing     Mobility  Bed Mobility Bed Mobility: Supine to Sit;Sitting - Scoot to Edge of Bed Supine to Sit: 4: Min guard;With rails Sitting - Scoot to Edge of Bed: 3: Mod assist;With rail Details for Bed Mobility Assistance: Continued good scooting of hips to EOB in prep for getting up; used bed pad to square off hips at EOB Transfers Transfers: Sit to Stand;Stand to Sit Sit to Stand: 3: Mod assist Stand to Sit: 4: Min assist;With armrests;With upper extremity assist;To chair/3-in-1 Details for Transfer Assistance: Continuing improvements win transfers; Requires cues for hand placement and safety Ambulation/Gait Ambulation/Gait Assistance: 3: Mod assist Ambulation Distance (Feet): 4 Feet Assistive device: Rolling walker Ambulation/Gait Assistance Details: continued need for physical assist for RW maneuvering; One loss of balance posteriorly, corrected with cues to lean into RW Gait Pattern: Decreased step length - right;Decreased step length - left    Exercises     PT Diagnosis:    PT Problem List:   PT Treatment Interventions:     PT Goals Acute Rehab PT Goals Time For Goal  Achievement: 11/19/11 Potential to Achieve Goals: Good Pt will go Supine/Side to Sit: with supervision PT Goal: Supine/Side to Sit - Progress: Progressing toward goal Pt will go Sit to Stand: with supervision PT Goal: Sit to Stand - Progress: Progressing toward goal Pt will go Stand to Sit: with supervision PT Goal: Stand to Sit - Progress: Progressing toward goal Pt will Transfer Bed to Chair/Chair to Bed: with min assist PT Transfer Goal: Bed to Chair/Chair to Bed - Progress: Progressing toward goal Pt will Ambulate: 51 - 150 feet;with min assist;with least restrictive assistive device PT Goal: Ambulate - Progress: Not progressing  Visit Information  Last PT Received On: 11/06/11 Assistance Needed: +2    Subjective Data  Subjective: Feels lousy, no energy, but agreeable to OOB Patient Stated Goal: Feel better   Cognition  Overall Cognitive Status: History of cognitive impairments - at baseline Arousal/Alertness: Awake/alert Orientation Level: Appears intact for tasks assessed Behavior During Session: [facility] for tasks performed    Balance  Static Standing Balance Static Standing - Balance Support: Bilateral upper extremity supported Static Standing - Level of Assistance: 3: Mod assist Static Standing - Comment/# of Minutes: 2-3 minutes standing at RW; Cues and physical assist to keep weight anterior over RW and prevent loss of balance backwards  End of Session PT - End of Session Equipment Utilized During Treatment: Gait belt Activity Tolerance: Patient tolerated treatment well Patient left: in chair;with chair alarm set;with call bell/phone within reach Nurse Communication: Mobility status   GP     Olen Pel [place], [place] [phone]  11/06/2011, 11:34 AM

## 2011-11-07 NOTE — Clinical Social Work Note (Signed)
LATE ENTRY FOR 11/06/11: Patient was set to d/c 6/25 back to [facility] skilled facility, however MD cancelled d/c to high BP. Patient did discharge 6/26 back to SNF. Progress note from Dr. Irene Limbo faxed to facility and CSW facilitated transport to nursing facility via ambulance.  Genelle Bal, MSW, LCSW [phone]

## 2011-11-08 LAB — CULTURE, BLOOD (ROUTINE X 2)
Culture  Setup Time: 201306221725
Culture: NO GROWTH

## 2012-05-05 ENCOUNTER — Inpatient Hospital Stay (HOSPITAL_COMMUNITY)
Admission: EM | Admit: 2012-05-05 | Discharge: 2012-05-07 | DRG: 378 | Disposition: A | Discharge status: Skilled Nursing Facility | Payer: Medicare Other | Attending: Family Medicine | Admitting: Family Medicine

## 2012-05-05 ENCOUNTER — Encounter (HOSPITAL_COMMUNITY)
Admission: EM | Disposition: A | Discharge status: Skilled Nursing Facility | Payer: Self-pay | Source: Home / Self Care | Attending: Internal Medicine

## 2012-05-05 ENCOUNTER — Emergency Department (HOSPITAL_COMMUNITY): Payer: Medicare Other

## 2012-05-05 ENCOUNTER — Encounter (HOSPITAL_COMMUNITY): Payer: Self-pay | Admitting: Emergency Medicine

## 2012-05-05 DIAGNOSIS — Z66 Do not resuscitate: Secondary | ICD-10-CM | POA: Diagnosis present

## 2012-05-05 DIAGNOSIS — K2961 Other gastritis with bleeding: Principal | ICD-10-CM | POA: Diagnosis present

## 2012-05-05 DIAGNOSIS — F32A Depression, unspecified: Secondary | ICD-10-CM

## 2012-05-05 DIAGNOSIS — F0390 Unspecified dementia without behavioral disturbance: Secondary | ICD-10-CM

## 2012-05-05 DIAGNOSIS — Z79899 Other long term (current) drug therapy: Secondary | ICD-10-CM

## 2012-05-05 DIAGNOSIS — N189 Chronic kidney disease, unspecified: Secondary | ICD-10-CM

## 2012-05-05 DIAGNOSIS — F3289 Other specified depressive episodes: Secondary | ICD-10-CM | POA: Diagnosis present

## 2012-05-05 DIAGNOSIS — E039 Hypothyroidism, unspecified: Secondary | ICD-10-CM | POA: Diagnosis present

## 2012-05-05 DIAGNOSIS — M810 Age-related osteoporosis without current pathological fracture: Secondary | ICD-10-CM

## 2012-05-05 DIAGNOSIS — K219 Gastro-esophageal reflux disease without esophagitis: Secondary | ICD-10-CM

## 2012-05-05 DIAGNOSIS — E785 Hyperlipidemia, unspecified: Secondary | ICD-10-CM

## 2012-05-05 DIAGNOSIS — N179 Acute kidney failure, unspecified: Secondary | ICD-10-CM | POA: Diagnosis not present

## 2012-05-05 DIAGNOSIS — I2510 Atherosclerotic heart disease of native coronary artery without angina pectoris: Secondary | ICD-10-CM | POA: Diagnosis present

## 2012-05-05 DIAGNOSIS — I129 Hypertensive chronic kidney disease with stage 1 through stage 4 chronic kidney disease, or unspecified chronic kidney disease: Secondary | ICD-10-CM | POA: Diagnosis present

## 2012-05-05 DIAGNOSIS — G20 Parkinson's disease: Secondary | ICD-10-CM

## 2012-05-05 DIAGNOSIS — F411 Generalized anxiety disorder: Secondary | ICD-10-CM | POA: Diagnosis present

## 2012-05-05 DIAGNOSIS — G629 Polyneuropathy, unspecified: Secondary | ICD-10-CM

## 2012-05-05 DIAGNOSIS — F329 Major depressive disorder, single episode, unspecified: Secondary | ICD-10-CM | POA: Diagnosis present

## 2012-05-05 DIAGNOSIS — K922 Gastrointestinal hemorrhage, unspecified: Secondary | ICD-10-CM

## 2012-05-05 DIAGNOSIS — G9340 Encephalopathy, unspecified: Secondary | ICD-10-CM

## 2012-05-05 DIAGNOSIS — I10 Essential (primary) hypertension: Secondary | ICD-10-CM

## 2012-05-05 DIAGNOSIS — D62 Acute posthemorrhagic anemia: Secondary | ICD-10-CM | POA: Diagnosis present

## 2012-05-05 DIAGNOSIS — D649 Anemia, unspecified: Secondary | ICD-10-CM

## 2012-05-05 DIAGNOSIS — R6889 Other general symptoms and signs: Secondary | ICD-10-CM

## 2012-05-05 DIAGNOSIS — L0390 Cellulitis, unspecified: Secondary | ICD-10-CM

## 2012-05-05 DIAGNOSIS — I959 Hypotension, unspecified: Secondary | ICD-10-CM

## 2012-05-05 DIAGNOSIS — R55 Syncope and collapse: Secondary | ICD-10-CM

## 2012-05-05 DIAGNOSIS — Z993 Dependence on wheelchair: Secondary | ICD-10-CM

## 2012-05-05 DIAGNOSIS — G20A1 Parkinson's disease without dyskinesia, without mention of fluctuations: Secondary | ICD-10-CM

## 2012-05-05 DIAGNOSIS — F0280 Dementia in other diseases classified elsewhere without behavioral disturbance: Secondary | ICD-10-CM | POA: Diagnosis present

## 2012-05-05 DIAGNOSIS — F419 Anxiety disorder, unspecified: Secondary | ICD-10-CM

## 2012-05-05 DIAGNOSIS — G8929 Other chronic pain: Secondary | ICD-10-CM

## 2012-05-05 DIAGNOSIS — G3183 Dementia with Lewy bodies: Secondary | ICD-10-CM | POA: Diagnosis present

## 2012-05-05 HISTORY — PX: ESOPHAGOGASTRODUODENOSCOPY: SHX5428

## 2012-05-05 LAB — COMPREHENSIVE METABOLIC PANEL
ALT: 12 U/L (ref 0–35)
Alkaline Phosphatase: 87 U/L (ref 39–117)
BUN: 28 mg/dL — ABNORMAL HIGH (ref 6–23)
CO2: 25 mEq/L (ref 19–32)
Calcium: 9.2 mg/dL (ref 8.4–10.5)
GFR calc Af Amer: 66 mL/min — ABNORMAL LOW (ref >90)
GFR calc non Af Amer: 57 mL/min — ABNORMAL LOW (ref >90)
Glucose, Bld: 120 mg/dL — ABNORMAL HIGH (ref 70–99)
Sodium: 138 mEq/L (ref 135–145)

## 2012-05-05 LAB — CBC
HCT: 43.8 % (ref 36.0–46.0)
Hemoglobin: 14.8 g/dL (ref 12.0–15.0)
MCH: 32 pg (ref 26.0–34.0)
RBC: 4.62 MIL/uL (ref 3.87–5.11)

## 2012-05-05 LAB — APTT: aPTT: 28 seconds (ref 24–37)

## 2012-05-05 LAB — PROTIME-INR: INR: 0.92 (ref 0.00–1.49)

## 2012-05-05 SURGERY — EGD (ESOPHAGOGASTRODUODENOSCOPY)
Anesthesia: Moderate Sedation

## 2012-05-05 MED ORDER — LEVOTHYROXINE SODIUM 25 MCG PO TABS
25.0000 ug | ORAL_TABLET | Freq: Every day | ORAL | Status: DC
  Administered 2012-05-06 – 2012-05-07 (×2): 25 ug via ORAL
  Filled 2012-05-05 (×3): qty 1

## 2012-05-05 MED ORDER — MOMETASONE FURO-FORMOTEROL FUM 100-5 MCG/ACT IN AERO
2.0000 | INHALATION_SPRAY | Freq: Two times a day (BID) | RESPIRATORY_TRACT | Status: DC
  Administered 2012-05-05 – 2012-05-07 (×5): 2 via RESPIRATORY_TRACT
  Filled 2012-05-05: qty 8.8

## 2012-05-05 MED ORDER — MIDAZOLAM HCL 5 MG/ML IJ SOLN
INTRAMUSCULAR | Status: AC
  Filled 2012-05-05: qty 2

## 2012-05-05 MED ORDER — FENTANYL CITRATE 0.05 MG/ML IJ SOLN
INTRAMUSCULAR | Status: AC
  Filled 2012-05-05: qty 2

## 2012-05-05 MED ORDER — TIOTROPIUM BROMIDE MONOHYDRATE 18 MCG IN CAPS
18.0000 ug | ORAL_CAPSULE | Freq: Every day | RESPIRATORY_TRACT | Status: DC
  Administered 2012-05-05 – 2012-05-07 (×3): 18 ug via RESPIRATORY_TRACT
  Filled 2012-05-05: qty 5

## 2012-05-05 MED ORDER — PANTOPRAZOLE SODIUM 40 MG IV SOLR
8.0000 mg/h | INTRAVENOUS | Status: DC
  Administered 2012-05-05: 8 mg/h via INTRAVENOUS
  Filled 2012-05-05 (×9): qty 80

## 2012-05-05 MED ORDER — SUCRALFATE 1 GM/10ML PO SUSP
1.0000 g | Freq: Three times a day (TID) | ORAL | Status: DC
  Administered 2012-05-05 – 2012-05-07 (×9): 1 g via ORAL
  Filled 2012-05-05 (×12): qty 10

## 2012-05-05 MED ORDER — LABETALOL HCL 5 MG/ML IV SOLN
10.0000 mg | Freq: Once | INTRAVENOUS | Status: AC
  Administered 2012-05-05: 10 mg via INTRAVENOUS
  Filled 2012-05-05: qty 4

## 2012-05-05 MED ORDER — GABAPENTIN 100 MG PO CAPS
100.0000 mg | ORAL_CAPSULE | Freq: Three times a day (TID) | ORAL | Status: DC
  Administered 2012-05-05 – 2012-05-07 (×7): 100 mg via ORAL
  Filled 2012-05-05 (×9): qty 1

## 2012-05-05 MED ORDER — SODIUM CHLORIDE 0.9 % IJ SOLN
3.0000 mL | Freq: Two times a day (BID) | INTRAMUSCULAR | Status: DC
  Administered 2012-05-05: 3 mL via INTRAVENOUS

## 2012-05-05 MED ORDER — TRAMADOL HCL 50 MG PO TABS: 50.0000 mg | ORAL_TABLET | Freq: Three times a day (TID) | ORAL | Status: DC | PRN

## 2012-05-05 MED ORDER — SODIUM CHLORIDE 0.9 % IV SOLN
80.0000 mg | Freq: Once | INTRAVENOUS | Status: AC
  Administered 2012-05-05: 80 mg via INTRAVENOUS
  Filled 2012-05-05 (×2): qty 80

## 2012-05-05 MED ORDER — LEVOFLOXACIN IN D5W 500 MG/100ML IV SOLN
500.0000 mg | INTRAVENOUS | Status: DC
  Administered 2012-05-07: 500 mg via INTRAVENOUS
  Filled 2012-05-05: qty 100

## 2012-05-05 MED ORDER — CALCIUM CARBONATE-VITAMIN D 500-200 MG-UNIT PO TABS
1.0000 | ORAL_TABLET | Freq: Every day | ORAL | Status: DC
  Administered 2012-05-05 – 2012-05-07 (×3): 1 via ORAL
  Filled 2012-05-05 (×3): qty 1

## 2012-05-05 MED ORDER — HYPROMELLOSE (GONIOSCOPIC) 2.5 % OP SOLN
1.0000 [drp] | OPHTHALMIC | Status: DC
  Administered 2012-05-06 – 2012-05-07 (×7): 1 [drp] via OPHTHALMIC
  Filled 2012-05-05: qty 15

## 2012-05-05 MED ORDER — HYDRALAZINE HCL 20 MG/ML IJ SOLN
10.0000 mg | Freq: Once | INTRAMUSCULAR | Status: AC
  Administered 2012-05-05: 10 mg via INTRAVENOUS
  Filled 2012-05-05: qty 1

## 2012-05-05 MED ORDER — ACYCLOVIR 200 MG PO CAPS
200.0000 mg | ORAL_CAPSULE | Freq: Every day | ORAL | Status: DC
  Administered 2012-05-05 – 2012-05-07 (×3): 200 mg via ORAL
  Filled 2012-05-05 (×3): qty 1

## 2012-05-05 MED ORDER — SPIRONOLACTONE 25 MG PO TABS
25.0000 mg | ORAL_TABLET | Freq: Every day | ORAL | Status: DC
  Filled 2012-05-05: qty 1

## 2012-05-05 MED ORDER — DULOXETINE HCL 30 MG PO CPEP
30.0000 mg | ORAL_CAPSULE | Freq: Two times a day (BID) | ORAL | Status: DC
  Administered 2012-05-05 – 2012-05-07 (×5): 30 mg via ORAL
  Filled 2012-05-05 (×6): qty 1

## 2012-05-05 MED ORDER — LISINOPRIL 5 MG PO TABS
5.0000 mg | ORAL_TABLET | Freq: Every day | ORAL | Status: DC
  Administered 2012-05-05 – 2012-05-06 (×2): 5 mg via ORAL
  Filled 2012-05-05 (×2): qty 1

## 2012-05-05 MED ORDER — MIDAZOLAM HCL 10 MG/2ML IJ SOLN
INTRAMUSCULAR | Status: DC | PRN
  Administered 2012-05-05 (×2): .5 mg via INTRAVENOUS

## 2012-05-05 MED ORDER — LABETALOL HCL 5 MG/ML IV SOLN
10.0000 mg | INTRAVENOUS | Status: DC | PRN
  Filled 2012-05-05: qty 4

## 2012-05-05 MED ORDER — MEMANTINE HCL 5 MG PO TABS
5.0000 mg | ORAL_TABLET | Freq: Two times a day (BID) | ORAL | Status: DC
  Administered 2012-05-05 – 2012-05-07 (×5): 5 mg via ORAL
  Filled 2012-05-05 (×6): qty 1

## 2012-05-05 MED ORDER — LABETALOL HCL 100 MG PO TABS
100.0000 mg | ORAL_TABLET | Freq: Two times a day (BID) | ORAL | Status: DC
  Administered 2012-05-05 – 2012-05-06 (×4): 100 mg via ORAL
  Filled 2012-05-05 (×7): qty 1

## 2012-05-05 MED ORDER — DIPHENHYDRAMINE HCL 50 MG/ML IJ SOLN
INTRAMUSCULAR | Status: AC
  Filled 2012-05-05: qty 1

## 2012-05-05 MED ORDER — LEVOFLOXACIN IN D5W 750 MG/150ML IV SOLN
750.0000 mg | INTRAVENOUS | Status: DC
  Administered 2012-05-05 – 2012-05-06 (×2): 750 mg via INTRAVENOUS
  Filled 2012-05-05 (×4): qty 150

## 2012-05-05 MED ORDER — ISOSORBIDE MONONITRATE ER 30 MG PO TB24
30.0000 mg | ORAL_TABLET | Freq: Every day | ORAL | Status: DC
  Administered 2012-05-05 – 2012-05-07 (×3): 30 mg via ORAL
  Filled 2012-05-05 (×3): qty 1

## 2012-05-05 MED ORDER — SODIUM CHLORIDE 0.9 % IV SOLN
INTRAVENOUS | Status: DC
  Administered 2012-05-07: 01:00:00 via INTRAVENOUS

## 2012-05-05 MED ORDER — ACETAMINOPHEN 325 MG PO TABS
650.0000 mg | ORAL_TABLET | Freq: Three times a day (TID) | ORAL | Status: DC
Start: 2012-05-05 — End: 2012-05-07
  Administered 2012-05-06 – 2012-05-07 (×4): 650 mg via ORAL
  Filled 2012-05-05 (×3): qty 2

## 2012-05-05 MED ORDER — BUTAMBEN-TETRACAINE-BENZOCAINE 2-2-14 % EX AERO
INHALATION_SPRAY | CUTANEOUS | Status: DC | PRN
  Administered 2012-05-05: 1 via TOPICAL

## 2012-05-05 NOTE — Op Note (Signed)
[facility] [address] [place], [address]   ENDOSCOPY PROCEDURE REPORT  PATIENT: [name] [name], [name] [name]  MR#: [id number] BIRTHDATE: Jan 25, 1918 , 89  yrs. old GENDER: Female ENDOSCOPIST:Dani Wallner, MD REFERRED BY:  unassigned PROCEDURE DATE:  05/05/2012 PROCEDURE:      esophagogastroduodenoscopy ASA CLASS: INDICATIONS:   coffee ground emesis, while on daily aspirin and Naprosyn MEDICATION:    Versed 1 mg IV TOPICAL ANESTHETIC:    Cetacaine spray  DESCRIPTION OF PROCEDURE:   the patient was brought from her hospital room to the [facility] cone endoscopy unit in a fasted state and sedated as described above, resulting in mild sedation with no clinical instability. The Pentax pediatric video upper endoscope was used for this procedure to allow minimal sedation, and was passed under direct vision, entering the esophagus without difficulty. The vocal cords looked grossly normal.  The esophagus was essentially normal, with perhaps some slight mucosal exudate as a consequence of emesis. No frank esophageal erosions or ulcerations were noted. There was no evidence of a Mallory-Weiss tear, varices, neoplasia, or infection in the esophagus.  There was a roughly 6 cm hiatal hernia present, and the diaphragmatic hiatus was very patulous but there was no evidence of Cameron ulcerations.  The stomach was entered. There were a a few coffee ground flecks present, but no blood. There was circumferential erosive change in the immediate prepyloric region, with one section of this erosion having a dirty based that would not wash off, consistent with stigma of recent hemorrhage. No visible vessel was seen.  The remainder of the stomach was unremarkable, without antral erosions, ulcers, or any polyps or masses. A retroflex view of the cardia showed a large hiatal hernia from its inferior perspective, with a very patulous diaphragmatic hiatus as noted on  antegrade viewing.  The duodenal bulb and second duodenum looked normal.  The scope was removed from the patient. No biopsies were obtained. She tolerated the procedure well.     COMPLICATIONS: None  ENDOSCOPIC IMPRESSION:  1. No active bleeding or blood in the stomach at the time this procedure. 2. Erosive change in the prepyloric area, consistent with aspirin and Naprosyn exposure, constituting the most likely explanation for her recent coffee-ground emesis and recent dyspeptic symptoms.  RECOMMENDATIONS:  1.  Ccontinue high-dose antipeptic therapy as an inpatient. I feel the patient should have in-house observation for approximately the next 48 hours, taking into account her advanced age and the stigmata of hemorrhage seen on her prepyloric erosion.  2. After discharge, I do not think the patient will need sucralfate on an ongoing basis, but she will need some intensification of antipeptic therapy over the omeprazole 40 mg every morning that she was taking prior to admission, since she obviously failed that therapy. It sounds as though she really needs her nonsteroidal anti-inflammatory drugs, so I would favor going to twice daily PPI therapy. Another option would be starting the patient on misoprostol 100-200 mcg by mouth 3 times a day, especially if the patient has a tendency toward constipation, since this medicine has a tendency to give diarrhea.  3. In view of the patient's age and the relatively benign appearance on her current endoscopy, I do not feel that followup endoscopy will be required, as long as the patient does well clinically in the future.   _______________________________ Rosalie DoctorBernette Redbird, MD 05/05/2012 3:05 PM    PATIENT NAME:  [name] [name] MR#: [id number]

## 2012-05-05 NOTE — ED Notes (Signed)
Report called to [facility] RN bed 2021 bed is ready pt enroute to floor

## 2012-05-05 NOTE — Progress Notes (Addendum)
Reviewed medical records and H&P.    S:  Patient states she has been having coffee ground emesis for the last few days.  She has had two episodes this morning, the second was about .  Denies lightheadedness, SOB, but has indigestion/heartburn like pain and intermittent nausea.  Vomiting relieves the nausea.  No BMs.  O:  Labile blood pressures Gen:  CF, mild circumoral pallor HEENT:  NCAT, MMM CV:  RRR, nl S1, S2, 2+ pulses, warm extremities PULM: CTAB ABD:  NABS, soft, nondistended, nontender, no organomegaly MSK:  No LEE  A/P:  Spoke with patient and family regarding possible EGD and initially they requested monitoring for a few days to see if symptoms improved on protonix gtt and carafate.  When Dr. Matthias Hughs reviewed history, however, he was concerned that patient had failed outpatient PPI and was having ongoing emesis.  Recommended EGD for this afternoon.  EGD demonstrated erosive changes in the prepyloric area consistent with aspirin and naprosyn use.   -  Continue PPI for next 48 h -  NPO today, then advance diet to clear liquids in AM and then low residue by tomorrow evening if tolerating okay -  After PPI gtt discontinued omeprazole 40mg  BID  -  F/u GI prn -  Dr. Matthias Hughs stated he would around this week and to page with questions  Leukocytosis:  CXR demonstrates small to moderate left pleural effusion with associated left lower lobe opacity.   - Levofloxacin for possible pneumonia - Incentive spirometry - UA  Home on Thursday

## 2012-05-05 NOTE — Progress Notes (Signed)
Endoscopy was well tolerated. Findings discussed with attending physician. The main finding was erosive change in the prepyloric area consistent with exposure to nonsteroidal anti-inflammatory drugs. No active bleeding. Moderately large hiatal hernia.  Recommendation: N.p.o. until tomorrow morning, except meds and ice chips, to allow some time for healing to begin. Continue high-dose PPI and sucralfate. Hopefully can start clear liquids tomorrow and, if tolerated, she could eat a  low residue supper by evening tomorrow for Christmas dinner. I estimate that she will need to be in the hospital approximately 48 hours, based on her endoscopic appearance. I would discharge her on omeprazole 40 mg twice daily (was on 40 mg once daily prior to admission).  Please see dictated procedure report findings and recommendations. Call me if questions.  [name], M.D. [phone]

## 2012-05-05 NOTE — Care Management Note (Addendum)
    Page 1 of 1   05/07/2012     3:11:45 PM   CARE MANAGEMENT NOTE 05/07/2012  Patient:  [name] [name], [name] [name]   Account Number:  [id number]  Date Initiated:  05/05/2012  Documentation initiated by:  Junius Creamer  Subjective/Objective Assessment:   adm w gi bleed, htn     Action/Plan:   from nsg facility   Anticipated DC Date:  05/07/2012   Anticipated DC Plan:  SKILLED NURSING FACILITY  In-house referral  Clinical Social Worker      DC Planning Services  CM consult      Choice offered to / List presented to:             Status of service:  Completed, signed off Medicare Important Message given?   (If response is "NO", the following Medicare IM given date Villagomez will be blank) Date Medicare IM given:   Date Additional Medicare IM given:    Discharge Disposition:  SKILLED NURSING FACILITY  Per UR Regulation:  Reviewed for med. necessity/level of care/duration of stay  If discussed at Long Length of Stay Meetings, dates discussed:    Comments:  05/07/12 Rosalita Chessman [phone] PT DISCHARGED BACK TO [facility], PER CSW ARRANGEMENTS.  12/24 1030a debbie dowell rn,bsn

## 2012-05-05 NOTE — H&P (Signed)
[facility] History and Physical  [name] EXB:[id number] DOB: 03/07/1918 DOA: 05/05/2012  Referring physician: ED PCP: Kimber Relic, MD  Specialists: Mikeal Hawthorne - neuro  Chief Complaint: Hematemesis  HPI: [name] [name] is a 78 y.o. female who presents to the ED with 1 day history of 2 to 4 episodes per day of coffee ground emesis.    Review of Systems: No fever, patient states no abdominal pain, has been having nausea and vomiting, 12 systems are reviewed and otherwise negative.  Past Medical History  Diagnosis Date  . Hypertension   . COPD (chronic obstructive pulmonary disease)   . CHF (congestive heart failure)   . Coronary artery disease   . Renal disorder   . GERD (gastroesophageal reflux disease)   . Osteoporosis   . Syncope   . Dementia   . Anemia   . Parkinson disease   . Neuropathy   . Thyroid disease   . Hyperlipidemia   . Anxiety   . Depression   . Chronic pain 11/01/2011   Past Surgical History  Procedure Date  . Fracture surgery    Social History:  does not have a smoking history on file. She does not have any smokeless tobacco history on file. She reports that she does not drink alcohol or use illicit drugs. Patient lives in SNF, wheelchair bound at baseline, has significant dementia as well.  No Known Allergies  History reviewed. No pertinent family history.   Prior to Admission medications   Medication Sig Start Date End Date Taking? Authorizing Provider  acetaminophen (TYLENOL) 325 MG tablet Take 2 tablets (650 mg total) by mouth every 8 (eight) hours. 11/05/11 11/04/12 Yes [name] Bong, MD  acyclovir (ZOVIRAX) 200 MG capsule Take 200 mg by mouth daily.   Yes Historical Provider, MD  amoxicillin (AMOXIL) 500 MG capsule Take 2,000 mg by mouth once. Prior to dental procedure   Yes Historical Provider, MD  aspirin 81 MG tablet Take 81 mg by mouth daily.   Yes Historical Provider, MD  calcium-vitamin D (OSCAL WITH D) 500-200 MG-UNIT per  tablet Take 1 tablet by mouth daily.   Yes Historical Provider, MD  DULoxetine (CYMBALTA) 30 MG capsule Take 30 mg by mouth 2 (two) times daily.   Yes Historical Provider, MD  Fluticasone-Salmeterol (ADVAIR) 100-50 MCG/DOSE AEPB Inhale 1 puff into the lungs every 12 (twelve) hours.   Yes Historical Provider, MD  furosemide (LASIX) 20 MG tablet Take 10 mg by mouth 2 (two) times daily.   Yes Historical Provider, MD  gabapentin (NEURONTIN) 100 MG capsule Take 100 mg by mouth 3 (three) times daily.   Yes Historical Provider, MD  hydroxypropyl methylcellulose (ISOPTO TEARS) 2.5 % ophthalmic solution Place 1 drop into both eyes every 4 (four) hours.   Yes Historical Provider, MD  isosorbide mononitrate (IMDUR) 30 MG 24 hr tablet Take 30 mg by mouth daily.   Yes Historical Provider, MD  levothyroxine (SYNTHROID, LEVOTHROID) 25 MCG tablet Take 25 mcg by mouth daily.   Yes Historical Provider, MD  memantine (NAMENDA) 5 MG tablet Take 5 mg by mouth 2 (two) times daily.   Yes Historical Provider, MD  naproxen sodium (ANAPROX) 220 MG tablet Take 220 mg by mouth 3 (three) times daily with meals.   Yes Historical Provider, MD  omeprazole (PRILOSEC) 20 MG capsule Take 2 capsules (40 mg total) by mouth daily. 11/05/11  Yes [name] Bong, MD  polyethylene glycol [facility] / GLYCOLAX) packet Take 17 g  by mouth daily.   Yes Historical Provider, MD  spironolactone (ALDACTONE) 25 MG tablet Take 25 mg by mouth daily.   Yes Historical Provider, MD  tiotropium (SPIRIVA) 18 MCG inhalation capsule Place 18 mcg into inhaler and inhale daily.   Yes Historical Provider, MD  traMADol (ULTRAM) 50 MG tablet Take 50 mg by mouth every 8 (eight) hours as needed. For pain   Yes Historical Provider, MD   Physical Exam: Filed Vitals:   05/05/12 0241 05/05/12 0245 05/05/12 0250 05/05/12 0254  BP: 203/77 192/102 202/114 202/114  Pulse: 73 65 66   Temp:      TempSrc:      Resp: 17 19 23    SpO2: 97% 98% 98%     General:  NAD,  resting comfortably in bed Eyes: PEERLA EOMI ENT: mucous membranes moist Neck: supple w/o JVD Cardiovascular: RRR w/o MRG Respiratory: CTA B Abdomen: soft, nt, nd, bs+ Skin: no rash nor lesion Musculoskeletal: MAE, full ROM all 4 extremities Psychiatric: normal tone and affect Neurologic: AAOx3, grossly non-focal  Labs on Admission:  Basic Metabolic Panel:  Lab 05/05/12 9604  NA 138  K 3.8  CL 103  CO2 25  GLUCOSE 120*  BUN 28*  CREATININE 0.86  CALCIUM 9.2  MG --  PHOS --   Liver Function Tests:  Lab 05/05/12 0233  AST 18  ALT 12  ALKPHOS 87  BILITOT 0.3  PROT 6.5  ALBUMIN 3.3*   No results found for this basename: LIPASE:5,AMYLASE:5 in the last 168 hours No results found for this basename: AMMONIA:5 in the last 168 hours CBC:  Lab 05/05/12 0233  WBC 16.8*  NEUTROABS --  HGB 14.8  HCT 43.8  MCV 94.8  PLT 310   Cardiac Enzymes: No results found for this basename: CKTOTAL:5,CKMB:5,CKMBINDEX:5,TROPONINI:5 in the last 168 hours  BNP (last 3 results) No results found for this basename: PROBNP:3 in the last 8760 hours CBG: No results found for this basename: GLUCAP:5 in the last 168 hours  Radiological Exams on Admission: Dg Chest Port 1 View  05/05/2012  *RADIOLOGY REPORT*  Clinical Data: Hematemesis  PORTABLE CHEST - 1 VIEW  Comparison: 11/04/2011  Findings: Chronic interstitial markings.  Suspected small to moderate left pleural effusion, mildly increased.  Associated left lower lobe opacity, likely atelectasis.  No pneumothorax.  Stable mild cardiomegaly.  IMPRESSION: Small to moderate left pleural effusion, mildly increased.  Associated left lower lobe opacity, likely atelectasis.   Original Report Authenticated By: Charline Bills, M.D.     EKG: Independently reviewed.  Assessment/Plan Principal Problem:  *UGI bleed Active Problems:  Dementia   1. UGI bleed - likely needs GI consult re: EGD, have put patient on PPI GTT for now, is clearly  hemodynamically stable with SBPs up into the 200s at times, holding NSAIDS, sips with meds only at this time, no evidence for anemia nor indications for transfusion at this time, HGB is 14.8, some of this may also be due in part to hemoconcentration. 2. HTN - treating with PRN labetalol, SBP at time of admission 160s, will try to keep below 180. 3. Pre-renal azotemia - very mild with only a slight bump in the BUN, holding home lasix and gently hydrating with fluids. 4. Dementia - chronic and appears to be at baseline for the patient  No consults called at this time  Code Status: DNR, patient comes with DNR from nursing home on paper and has been DNR on all previous visits for past couple of  years as well (must indicate code status--if unknown or must be presumed, indicate so) Family Communication: no family in room (indicate person spoken with, if applicable, with phone number if by telephone) Disposition Plan: Admit to obs (indicate anticipated LOS)  Time spent: 70 min  Twanda Stakes M. [facility] Pager [phone]  If 7PM-7AM, please contact night-coverage [email or web address] Password [email or web address] 05/05/2012, 5:30 AM

## 2012-05-05 NOTE — ED Notes (Addendum)
As reported by nursing home staff to EMS that pt had several coffee ground emesis over the last 2 - 3 days at [facility] ECF. abd tender nondistended no further emesis noted Pt is full DNR Phenergan RS given at 2100 VS 209/104 HR 84 rr 20 IV established L forearm 200 ml NSS infused

## 2012-05-05 NOTE — Clinical Social Work Psychosocial (Signed)
Clinical Social Work Department BRIEF PSYCHOSOCIAL ASSESSMENT 05/05/2012  Patient:  [name] [name], FADDIS     Account Number:  [id number]     Admit date:  05/05/2012  Clinical Social Worker:  Thomasene Mohair  Date/Time:  05/05/2012 03:00 PM  Referred by:  RN  Date Referred:  05/05/2012 Referred for  SNF Placement   Other Referral:   Interview type:  Other - See comment Other interview type:   chart review, facility rep    PSYCHOSOCIAL DATA Living Status:  FACILITY Admitted from facility:  [facility] Level of care:  Skilled Nursing Facility Primary support name:  [name], [name] [phone]   [phone] Primary support relationship to patient:  CHILD, ADULT Degree of support available:   [name] [name], adult son    CURRENT CONCERNS Current Concerns  Post-Acute Placement   Other Concerns:    SOCIAL WORK ASSESSMENT / PLAN CSW was referred to Pt by RN. Pt is from [facility] SNF.  She is long-term resident of facility, wheel chair at baseline. Pt is in surgery with family out of her room, but plan is likely to return at dc. CSW was contacted by [facility] and they can accept her back when medically ready.  CSW updated Fl2 and will f/u with Pt's family and staff as Pt's care progresses.   Assessment/plan status:  Psychosocial Support/Ongoing Assessment of Needs Other assessment/ plan:   Information/referral to community resources:    PATIENT'S/FAMILY'S RESPONSE TO PLAN OF CARE: Pt's family out of room while Pt is in surgery. CSW will f/u to address any concerns.   Frederico Hamman, LCSW [phone]

## 2012-05-05 NOTE — Interval H&P Note (Signed)
History and Physical Interval Note:  05/05/2012 2:30 PM  [name] [name]  has presented today for surgery, with the diagnosis of Hematemesis  The various methods of treatment have been discussed with the patient and family. After consideration of risks, benefits and other options for treatment, the patient has consented to  Procedure(s) (LRB) with comments: ESOPHAGOGASTRODUODENOSCOPY (EGD) (N/A) - Pediatric upper endoscope as a surgical intervention .  The patient's history has been reviewed, patient examined, no change in status, stable for surgery.  I have reviewed the patient's chart and labs.  Questions were answered to the patient's satisfaction.     [name]

## 2012-05-05 NOTE — ED Provider Notes (Addendum)
History     CSN: [id number]  Arrival date & time 05/05/12  0158   First MD Initiated Contact with Patient 05/05/12 0208      Chief Complaint  Patient presents with  . Hematemesis    coffee ground reported by  Nursing home staff    (Consider location/radiation/quality/duration/timing/severity/associated sxs/prior treatment) Patient is a 78 y.o. female presenting with vomiting. The history is provided by the patient and the EMS personnel. The history is limited by the condition of the patient.  Emesis  The current episode started yesterday. The problem occurs 2 to 4 times per day. The problem has not changed since onset.Vomiting appearance: coffee grounds. There has been no fever.    Past Medical History  Diagnosis Date  . Hypertension   . COPD (chronic obstructive pulmonary disease)   . CHF (congestive heart failure)   . Coronary artery disease   . Renal disorder   . GERD (gastroesophageal reflux disease)   . Osteoporosis   . Syncope   . Dementia   . Anemia   . Parkinson disease   . Neuropathy   . Thyroid disease   . Hyperlipidemia   . Anxiety   . Depression   . Chronic pain 11/01/2011    Past Surgical History  Procedure Date  . Fracture surgery     History reviewed. No pertinent family history.  History  Substance Use Topics  . Smoking status: Not on file  . Smokeless tobacco: Not on file  . Alcohol Use: No    OB History    Grav Para Term Preterm Abortions TAB SAB Ect Mult Living                  Review of Systems  Gastrointestinal: Positive for nausea and vomiting.  All other systems reviewed and are negative.    Allergies  Review of patient's allergies indicates no known allergies.  Home Medications   Current Outpatient Rx  Name  Route  Sig  Dispense  Refill  . ACETAMINOPHEN 325 MG PO TABS   Oral   Take 2 tablets (650 mg total) by mouth every 8 (eight) hours.         . ACYCLOVIR 200 MG PO CAPS   Oral   Take 200 mg by mouth daily.          . AMOXICILLIN 500 MG PO CAPS   Oral   Take 2,000 mg by mouth once. Prior to dental procedure         . ASPIRIN 81 MG PO TABS   Oral   Take 81 mg by mouth daily.         Marland Kitchen CALCIUM CARBONATE-VITAMIN D 500-200 MG-UNIT PO TABS   Oral   Take 1 tablet by mouth daily.         . DULOXETINE HCL 30 MG PO CPEP   Oral   Take 30 mg by mouth 2 (two) times daily.         Marland Kitchen FLUTICASONE-SALMETEROL 100-50 MCG/DOSE IN AEPB   Inhalation   Inhale 1 puff into the lungs every 12 (twelve) hours.         . FUROSEMIDE 20 MG PO TABS   Oral   Take 10 mg by mouth 2 (two) times daily.         Marland Kitchen GABAPENTIN 100 MG PO CAPS   Oral   Take 100 mg by mouth 3 (three) times daily.         Marland Kitchen HYPROMELLOSE 2.5 %  OP SOLN   Both Eyes   Place 1 drop into both eyes every 4 (four) hours.         . ISOSORBIDE MONONITRATE ER 30 MG PO TB24   Oral   Take 30 mg by mouth daily.         Marland Kitchen LEVOTHYROXINE SODIUM 25 MCG PO TABS   Oral   Take 25 mcg by mouth daily.         Marland Kitchen MEMANTINE HCL 5 MG PO TABS   Oral   Take 5 mg by mouth 2 (two) times daily.         Marland Kitchen NAPROXEN SODIUM 220 MG PO TABS   Oral   Take 220 mg by mouth 3 (three) times daily with meals.         . OMEPRAZOLE 20 MG PO CPDR   Oral   Take 2 capsules (40 mg total) by mouth daily.   31 capsule   0   . POLYETHYLENE GLYCOL 3350 PO PACK   Oral   Take 17 g by mouth daily.         Marland Kitchen SPIRONOLACTONE 25 MG PO TABS   Oral   Take 25 mg by mouth daily.         Marland Kitchen TIOTROPIUM BROMIDE MONOHYDRATE 18 MCG IN CAPS   Inhalation   Place 18 mcg into inhaler and inhale daily.         . TRAMADOL HCL 50 MG PO TABS   Oral   Take 50 mg by mouth every 8 (eight) hours as needed. For pain           BP 240/110  Temp 98.9 F (37.2 C) (Oral)  Resp 20  SpO2 92%  Physical Exam  Constitutional: She is oriented to person, place, and time. She appears well-developed and well-nourished.  HENT:  Head: Normocephalic and atraumatic.   Eyes: Conjunctivae normal and EOM are normal. Pupils are equal, round, and reactive to light.  Neck: Normal range of motion.  Cardiovascular: Normal rate, regular rhythm and normal heart sounds.   Pulmonary/Chest: Effort normal and breath sounds normal.  Abdominal: Soft. Bowel sounds are normal.  Musculoskeletal: Normal range of motion.  Neurological: She is alert and oriented to person, place, and time.  Skin: Skin is warm and dry.  Psychiatric: She has a normal mood and affect. Her behavior is normal.    ED Course  Procedures (including critical care time)   Labs Reviewed  CBC  COMPREHENSIVE METABOLIC PANEL  APTT  PROTIME-INR   No results found.   No diagnosis found.    MDM  + htn,  Hematemesis,  Will ppi, antihypertensive,  reassess    Discussed with hospitalist,  Will admit    Rosanne Ashing, MD 05/05/12 0234  Javad Salva Lytle Michaels, MD 05/05/12 [phone]

## 2012-05-05 NOTE — Progress Notes (Signed)
78yo female c/o 2-4 episodes of coffee-ground emesis, awaiting GI consult; CXR shows LLL opacity, to begin IV ABX.  Will give Levaquin 750mg  IV x1 followed by 500mg  Q48H for CrCl ~66ml/min and monitor.  Vernard Gambles, PharmD, BCPS 05/05/2012 7:49 AM

## 2012-05-05 NOTE — Consult Note (Signed)
Referring Provider: Dr. Owens Loffler Primary Care Physician:  Kimber Relic, MD Primary Gastroenterologist:  None  Reason for Consultation:  Coffee ground emesis  HPI: [name] [name] is a 78 y.o. female who resides in a nursing home and is on chronic low-dose aspirin as well as daily nonsteroidal anti-inflammatory drugs because of arthritic pain. She is maintained on omeprazole 40 mg daily as GI prophylaxis. With that background, for the past day or 2, she has had dyspeptic symptomatology and presented to the emergency room today following coffee ground emesis. Of note, she was hemodynamically stable and her hemoglobin was normal, with just a slight elevation of her BUN. No prior history of GI bleeding or ulcer disease. No long-term prodromal dyspeptic symptomatology.   Past Medical History  Diagnosis Date  . Hypertension   . COPD (chronic obstructive pulmonary disease)   . CHF (congestive heart failure)   . Coronary artery disease   . Renal disorder   . GERD (gastroesophageal reflux disease)   . Osteoporosis   . Syncope   . Dementia   . Anemia   . Parkinson disease   . Neuropathy   . Thyroid disease   . Hyperlipidemia   . Anxiety   . Depression   . Chronic pain 11/01/2011    Past Surgical History  Procedure Date  . Fracture surgery     Prior to Admission medications   Medication Sig Start Date End Date Taking? Authorizing Provider  acetaminophen (TYLENOL) 325 MG tablet Take 2 tablets (650 mg total) by mouth every 8 (eight) hours. 11/05/11 11/04/12 Yes Rodolph Bong, MD  acyclovir (ZOVIRAX) 200 MG capsule Take 200 mg by mouth daily.   Yes Historical Provider, MD  amoxicillin (AMOXIL) 500 MG capsule Take 2,000 mg by mouth once. Prior to dental procedure   Yes Historical Provider, MD  aspirin 81 MG tablet Take 81 mg by mouth daily.   Yes Historical Provider, MD  calcium-vitamin D (OSCAL WITH D) 500-200 MG-UNIT per tablet Take 1 tablet by mouth daily.   Yes Historical  Provider, MD  DULoxetine (CYMBALTA) 30 MG capsule Take 30 mg by mouth 2 (two) times daily.   Yes Historical Provider, MD  Fluticasone-Salmeterol (ADVAIR) 100-50 MCG/DOSE AEPB Inhale 1 puff into the lungs every 12 (twelve) hours.   Yes Historical Provider, MD  furosemide (LASIX) 20 MG tablet Take 10 mg by mouth 2 (two) times daily.   Yes Historical Provider, MD  gabapentin (NEURONTIN) 100 MG capsule Take 100 mg by mouth 3 (three) times daily.   Yes Historical Provider, MD  hydroxypropyl methylcellulose (ISOPTO TEARS) 2.5 % ophthalmic solution Place 1 drop into both eyes every 4 (four) hours.   Yes Historical Provider, MD  isosorbide mononitrate (IMDUR) 30 MG 24 hr tablet Take 30 mg by mouth daily.   Yes Historical Provider, MD  levothyroxine (SYNTHROID, LEVOTHROID) 25 MCG tablet Take 25 mcg by mouth daily.   Yes Historical Provider, MD  memantine (NAMENDA) 5 MG tablet Take 5 mg by mouth 2 (two) times daily.   Yes Historical Provider, MD  naproxen sodium (ANAPROX) 220 MG tablet Take 220 mg by mouth 3 (three) times daily with meals.   Yes Historical Provider, MD  omeprazole (PRILOSEC) 20 MG capsule Take 2 capsules (40 mg total) by mouth daily. 11/05/11  Yes Rodolph Bong, MD  polyethylene glycol [facility] / GLYCOLAX) packet Take 17 g by mouth daily.   Yes Historical Provider, MD  spironolactone (ALDACTONE) 25 MG tablet Take 25  mg by mouth daily.   Yes Historical Provider, MD  tiotropium (SPIRIVA) 18 MCG inhalation capsule Place 18 mcg into inhaler and inhale daily.   Yes Historical Provider, MD  traMADol (ULTRAM) 50 MG tablet Take 50 mg by mouth every 8 (eight) hours as needed. For pain   Yes Historical Provider, MD    Current Facility-Administered Medications  Medication Dose Route Frequency Provider Last Rate Last Dose  . 0.9 %  sodium chloride infusion   Intravenous Continuous Hillary Bow, DO      . acetaminophen (TYLENOL) tablet 650 mg  650 mg Oral Q8H Hillary Bow, DO      .  acyclovir (ZOVIRAX) 200 MG capsule 200 mg  200 mg Oral Daily Hillary Bow, DO   200 mg at 05/05/12 1006  . calcium-vitamin D (OSCAL WITH D) 500-200 MG-UNIT per tablet 1 tablet  1 tablet Oral Daily Hillary Bow, DO   1 tablet at 05/05/12 1006  . DULoxetine (CYMBALTA) DR capsule 30 mg  30 mg Oral BID Hillary Bow, DO   30 mg at 05/05/12 1007  . gabapentin (NEURONTIN) capsule 100 mg  100 mg Oral TID Hillary Bow, DO   100 mg at 05/05/12 1006  . hydroxypropyl methylcellulose (ISOPTO TEARS) 2.5 % ophthalmic solution 1 drop  1 drop Both Eyes Q4H Hillary Bow, DO      . isosorbide mononitrate (IMDUR) 24 hr tablet 30 mg  30 mg Oral Daily Hillary Bow, DO   30 mg at 05/05/12 1006  . labetalol (NORMODYNE) tablet 100 mg  100 mg Oral BID Renae Fickle, MD   100 mg at 05/05/12 1012  . labetalol (NORMODYNE,TRANDATE) injection 10 mg  10 mg Intravenous Q2H PRN Hillary Bow, DO      . levofloxacin (LEVAQUIN) IVPB 500 mg  500 mg Intravenous Q48H Veronda Hollie Salk, PHARMD      . levofloxacin (LEVAQUIN) IVPB 750 mg  750 mg Intravenous Q24H Colleen Can, PHARMD   750 mg at 05/05/12 1020  . levothyroxine (SYNTHROID, LEVOTHROID) tablet 25 mcg  25 mcg Oral QAC breakfast Hillary Bow, DO      . lisinopril (PRINIVIL,ZESTRIL) tablet 5 mg  5 mg Oral Daily Renae Fickle, MD   5 mg at 05/05/12 1012  . memantine (NAMENDA) tablet 5 mg  5 mg Oral BID Hillary Bow, DO   5 mg at 05/05/12 1007  . mometasone-formoterol (DULERA) 100-5 MCG/ACT inhaler 2 puff  2 puff Inhalation BID Hillary Bow, DO   2 puff at 05/05/12 0849  . pantoprazole (PROTONIX) 80 mg in sodium chloride 0.9 % 250 mL infusion  8 mg/hr Intravenous Continuous Hillary Bow, DO 25 mL/hr at 05/05/12 1301 8 mg/hr at 05/05/12 1301  . sodium chloride 0.9 % injection 3 mL  3 mL Intravenous Q12H Hillary Bow, DO   3 mL at 05/05/12 1013  . sucralfate (CARAFATE) 1 GM/10ML suspension 1 g  1 g Oral TID WC & HS Renae Fickle, MD   1 g at 05/05/12 1007  . tiotropium (SPIRIVA) inhalation capsule 18 mcg  18 mcg Inhalation Daily Hillary Bow, DO   18 mcg at 05/05/12 0849  . traMADol (ULTRAM) tablet 50 mg  50 mg Oral Q8H PRN Hillary Bow, DO        Allergies as of 05/05/2012  . (No Known Allergies)    History reviewed. No pertinent family history.  History  Social History  . Marital Status: Widowed    Spouse Name: N/A    Number of Children: N/A  . Years of Education: N/A   Occupational History  . Not on file.   Social History Main Topics  . Smoking status: Never Smoker   . Smokeless tobacco: Not on file  . Alcohol Use: No  . Drug Use: No  . Sexually Active: No   Other Topics Concern  . Not on file   Social History Narrative  . No narrative on file    Review of Systems: See history of present illness  Physical Exam: Vital signs in last 24 hours: Temp:  [98.2 F (36.8 C)-98.9 F (37.2 C)] 98.2 F (36.8 C) (12/24 1442) Pulse Rate:  [61-88] 67  (12/24 1510) Resp:  [13-44] 23  (12/24 1510) BP: (78-240)/(42-114) 104/50 mmHg (12/24 1510) SpO2:  [92 %-100 %] 99 % (12/24 1510) Weight:  [69.854 kg (154 lb)-70.3 kg (154 lb 15.7 oz)] 69.854 kg (154 lb) (12/24 1442) Last BM Date: 05/05/12 General:   Alert,  Well-developed, well-nourished, pleasant and cooperative in NAD, hard of hearing, slightly confused Head:  Normocephalic and atraumatic. Eyes:  Sclera clear, no icterus.    Mouth:   No ulcerations or lesions.  Oropharynx pink & moist. Lungs:  Clear throughout to auscultation.   No wheezes, crackles, or rhonchi. No evident respiratory distress. Heart:   Regular rate and rhythm; no murmurs, clicks, rubs,  or gallops. Abdomen:  Soft, nontender, nontympanitic, and nondistended. No masses, hepatosplenomegaly or ventral hernias noted. Quiet bowel sounds, without bruits, guarding, or rebound.   Rectal:  Soft, medium brown (nonmelenic) stool   Neurologic:  Alert and coherent;  grossly  normal neurologically. mild confusion, for example, was 2 years off for her date of birth  Skin:  Intact without significant lesions or rashes. Psych:   Alert and cooperative. Normal mood and affect.  Intake/Output from previous day:   Intake/Output this shift: Total I/O In: 240 [P.O.:240] Out: -   Lab Results:  Basename 05/05/12 1202 05/05/12 0233  WBC -- 16.8*  HGB 13.3 14.8  HCT -- 43.8  PLT -- 310   BMET  Basename 05/05/12 0233  NA 138  K 3.8  CL 103  CO2 25  GLUCOSE 120*  BUN 28*  CREATININE 0.86  CALCIUM 9.2   LFT  Basename 05/05/12 0233  PROT 6.5  ALBUMIN 3.3*  AST 18  ALT 12  ALKPHOS 87  BILITOT 0.3  BILIDIR --  IBILI --   PT/INR  Basename 05/05/12 0233  LABPROT 12.3  INR 0.92     Studies/Results: Dg Chest Port 1 View  05/05/2012  *RADIOLOGY REPORT*  Clinical Data: Hematemesis  PORTABLE CHEST - 1 VIEW  Comparison: 11/04/2011  Findings: Chronic interstitial markings.  Suspected small to moderate left pleural effusion, mildly increased.  Associated left lower lobe opacity, likely atelectasis.  No pneumothorax.  Stable mild cardiomegaly.  IMPRESSION: Small to moderate left pleural effusion, mildly increased.  Associated left lower lobe opacity, likely atelectasis.   Original Report Authenticated By: Charline Bills, M.D.     Impression:  low-grade upper GI bleed without hemodynamic instability or posthemorrhagic anemia, most likely a consequence of mucosal irritation related to aspirin and nonsteroidal anti-inflammatory drug exposure  Plan:  I would favor endoscopic evaluation this afternoon. The main reason is not because of the severity of her bleeding, which is actually rather minor, but rather, the need to guide future therapy with respect to nonsteroidal anti-inflammatory  drug exposure. If serious ulcer disease is noted, we may have to make modifications in her regimen. The nature, purpose, and risks of endoscopy were discussed with the patient  and her son, Gala Romney, who is at the bedside and they are agreeable to proceed.   LOS: 0 days   Monserath Neff V  05/05/2012, 3:16 PM

## 2012-05-06 DIAGNOSIS — N189 Chronic kidney disease, unspecified: Secondary | ICD-10-CM

## 2012-05-06 DIAGNOSIS — F0390 Unspecified dementia without behavioral disturbance: Secondary | ICD-10-CM

## 2012-05-06 DIAGNOSIS — N179 Acute kidney failure, unspecified: Secondary | ICD-10-CM

## 2012-05-06 DIAGNOSIS — D649 Anemia, unspecified: Secondary | ICD-10-CM

## 2012-05-06 DIAGNOSIS — K219 Gastro-esophageal reflux disease without esophagitis: Secondary | ICD-10-CM

## 2012-05-06 LAB — RENAL FUNCTION PANEL
CO2: 22 mEq/L (ref 19–32)
Chloride: 101 mEq/L (ref 96–112)
Creatinine, Ser: 1.67 mg/dL — ABNORMAL HIGH (ref 0.50–1.10)
GFR calc Af Amer: 30 mL/min — ABNORMAL LOW (ref >90)
GFR calc non Af Amer: 25 mL/min — ABNORMAL LOW (ref >90)
Potassium: 3.8 mEq/L (ref 3.5–5.1)
Sodium: 134 mEq/L — ABNORMAL LOW (ref 135–145)

## 2012-05-06 LAB — CBC
HCT: 34.8 % — ABNORMAL LOW (ref 36.0–46.0)
Hemoglobin: 11.6 g/dL — ABNORMAL LOW (ref 12.0–15.0)
RDW: 13.8 % (ref 11.5–15.5)
WBC: 12.7 10*3/uL — ABNORMAL HIGH (ref 4.0–10.5)

## 2012-05-06 LAB — BASIC METABOLIC PANEL
BUN: 46 mg/dL — ABNORMAL HIGH (ref 6–23)
Chloride: 106 mEq/L (ref 96–112)
GFR calc Af Amer: 35 mL/min — ABNORMAL LOW (ref >90)
Potassium: 3.9 mEq/L (ref 3.5–5.1)

## 2012-05-06 NOTE — Progress Notes (Signed)
[facility] PROGRESS NOTE  ASEEL TRUXILLO WUJ:[id number] DOB: 1917/06/10 DOA: 05/05/2012 PCP: Kimber Relic, MD  Assessment/Plan: 1. UGI bleed: Per GI at this juncture.  Please refer to their note for recommendations 2. ARF: Etiology unclear at this juncture. Will order renal panel, urine sodium and creatinine and plan on calculating FeNa.  Agree with continuing IVF's and reassessing next am.  If no improvement will consider Nephro consult. Order strict ins and outs. Will hold nephrotoxic agents (lisinopril at this juncture) 3. Dementia: Stable continue home regimen 4. Hypothyroidism: check tsh, otherwise continue current home regimen synthroid. 5. HTN: Will hold lisinopril, Will continue to monitor blood pressures and have placed holding orders on B blocker  Code Status: DNR Family Communication: No family at bedside Disposition Plan: Pending continued clinical improvement in condition.   Consultants:  GI  Procedures:  EGD  Antibiotics:  Levaquin  HPI/Subjective: Patient reports feeling better. No new complaints. No acute issues reported overnight.  Objective: Filed Vitals:   05/05/12 1515 05/05/12 2010 05/06/12 0516 05/06/12 1330  BP: 104/48 109/47 122/54 97/59  Pulse: 68 71 65 55  Temp:  97.9 F (36.6 C) 97.9 F (36.6 C) 97.8 F (36.6 C)  TempSrc:  Oral Oral Oral  Resp: 18 18 19 17   Height:      Weight:      SpO2: 97% 96% 94% 95%    Intake/Output Summary (Last 24 hours) at 05/06/12 1519 Last data filed at 05/06/12 1300  Gross per 24 hour  Intake 784.58 ml  Output      0 ml  Net 784.58 ml   Filed Weights   05/05/12 0706 05/05/12 1442  Weight: 70.3 kg (154 lb 15.7 oz) 69.854 kg (154 lb)    Exam:   General:  Pt in NAD, Alert and Awake and smiling  Cardiovascular: RRR, No MRG  Respiratory: CTA BL, no wheezes  Abdomen: soft, NT, ND  Data Reviewed: Basic Metabolic Panel:  Lab 05/06/12 9562 05/05/12 0233  NA 138 138  K 3.9 3.8  CL 106  103  CO2 22 25  GLUCOSE 85 120*  BUN 46* 28*  CREATININE 1.46* 0.86  CALCIUM 8.5 9.2  MG -- --  PHOS -- --   Liver Function Tests:  Lab 05/05/12 0233  AST 18  ALT 12  ALKPHOS 87  BILITOT 0.3  PROT 6.5  ALBUMIN 3.3*   No results found for this basename: LIPASE:5,AMYLASE:5 in the last 168 hours No results found for this basename: AMMONIA:5 in the last 168 hours CBC:  Lab 05/06/12 0500 05/05/12 1202 05/05/12 0233  WBC 12.7* -- 16.8*  NEUTROABS -- -- --  HGB 11.6* 13.3 14.8  HCT 34.8* -- 43.8  MCV 94.1 -- 94.8  PLT 252 -- 310   Cardiac Enzymes: No results found for this basename: CKTOTAL:5,CKMB:5,CKMBINDEX:5,TROPONINI:5 in the last 168 hours BNP (last 3 results) No results found for this basename: PROBNP:3 in the last 8760 hours CBG: No results found for this basename: GLUCAP:5 in the last 168 hours  No results found for this or any previous visit (from the past 240 hour(s)).   Studies: Dg Chest Port 1 View  05/05/2012  *RADIOLOGY REPORT*  Clinical Data: Hematemesis  PORTABLE CHEST - 1 VIEW  Comparison: 11/04/2011  Findings: Chronic interstitial markings.  Suspected small to moderate left pleural effusion, mildly increased.  Associated left lower lobe opacity, likely atelectasis.  No pneumothorax.  Stable mild cardiomegaly.  IMPRESSION: Small to moderate left pleural effusion, mildly  increased.  Associated left lower lobe opacity, likely atelectasis.   Original Report Authenticated By: Charline Bills, M.D.     Scheduled Meds:   . acetaminophen  650 mg Oral Q8H  . acyclovir  200 mg Oral Daily  . calcium-vitamin D  1 tablet Oral Daily  . DULoxetine  30 mg Oral BID  . gabapentin  100 mg Oral TID  . hydroxypropyl methylcellulose  1 drop Both Eyes Q4H  . isosorbide mononitrate  30 mg Oral Daily  . labetalol  100 mg Oral BID  . levofloxacin (LEVAQUIN) IV  500 mg Intravenous Q48H  . levofloxacin (LEVAQUIN) IV  750 mg Intravenous Q24H  . levothyroxine  25 mcg Oral QAC  breakfast  . lisinopril  5 mg Oral Daily  . memantine  5 mg Oral BID  . mometasone-formoterol  2 puff Inhalation BID  . sodium chloride  3 mL Intravenous Q12H  . sucralfate  1 g Oral TID WC & HS  . tiotropium  18 mcg Inhalation Daily   Continuous Infusions:   . sodium chloride    . pantoprozole (PROTONIX) infusion 8 mg/hr (05/05/12 1301)    Principal Problem:  *UGI bleed Active Problems:  Dementia    Time spent: >35 minutes    Penny Pia  [facility] Pager [phone]. If 8PM-8AM, please contact night-coverage at [email or web address], password [email or web address] 05/06/2012, 3:19 PM  LOS: 1 day

## 2012-05-06 NOTE — Progress Notes (Signed)
Patient is about to have her clear liquid lunch. She is in very good spirits, and appears comfortable lying in bed. Not much appetite, but no nausea or vomiting.  Endoscopic findings reviewed with the patient, along with need for intensification of PPI therapy post discharge.  Hemoglobin has dropped overnight, as expected. It has dropped from 14.8 to 11.6. This is consistent with equilibration related to hydration.  Of some concern, however, is a rise in both her BUN and creatinine overnight, despite hydration, raising the question of some possible ATN.  Impression:  1. Quiescent recent upper GI bleed related to prepyloric erosion, related to NSAID exposure. 2. Acute renal dysfunction, see above  Recommendation:  1. Continue current antipeptic therapy 2. Try to advance diet, as tolerated (ordered). I am recommending low residue for the time being, to diminish the amount of gastric motility required for gastric emptying. 3. Renal function management per hospitalist.  [name], M.D. [phone]

## 2012-05-07 ENCOUNTER — Encounter (HOSPITAL_COMMUNITY): Payer: Self-pay | Admitting: Gastroenterology

## 2012-05-07 DIAGNOSIS — F329 Major depressive disorder, single episode, unspecified: Secondary | ICD-10-CM

## 2012-05-07 LAB — BASIC METABOLIC PANEL
CO2: 23 mEq/L (ref 19–32)
Calcium: 8.6 mg/dL (ref 8.4–10.5)
Chloride: 104 mEq/L (ref 96–112)
Glucose, Bld: 87 mg/dL (ref 70–99)
Sodium: 135 mEq/L (ref 135–145)

## 2012-05-07 LAB — CBC
Hemoglobin: 11.7 g/dL — ABNORMAL LOW (ref 12.0–15.0)
MCH: 31.8 pg (ref 26.0–34.0)
MCV: 94.3 fL (ref 78.0–100.0)
Platelets: 251 10*3/uL (ref 150–400)
RBC: 3.68 MIL/uL — ABNORMAL LOW (ref 3.87–5.11)
WBC: 11 10*3/uL — ABNORMAL HIGH (ref 4.0–10.5)

## 2012-05-07 LAB — GLUCOSE, CAPILLARY: Glucose-Capillary: 86 mg/dL (ref 70–99)

## 2012-05-07 LAB — TSH: TSH: 4.612 u[IU]/mL — ABNORMAL HIGH (ref 0.350–4.500)

## 2012-05-07 MED ORDER — PANTOPRAZOLE SODIUM 40 MG PO TBEC: 40.0000 mg | DELAYED_RELEASE_TABLET | Freq: Two times a day (BID) | ORAL | Status: DC

## 2012-05-07 MED ORDER — SUCRALFATE 1 GM/10ML PO SUSP: 1.0000 g | Freq: Three times a day (TID) | ORAL | Status: DC

## 2012-05-07 MED ORDER — LEVOFLOXACIN 500 MG PO TABS: 500.0000 mg | ORAL_TABLET | ORAL | Status: DC

## 2012-05-07 MED ORDER — OMEPRAZOLE 20 MG PO CPDR: 40.0000 mg | DELAYED_RELEASE_CAPSULE | Freq: Two times a day (BID) | ORAL | Status: DC

## 2012-05-07 NOTE — Clinical Social Work Note (Signed)
CSW was notified by MD that Pt was ready to dc back to [facility]. P's info was sent to SNF and they are ready for Pt to return. Pt's nurse Marcelino Duster reported that Pt's sons are updated and 1 son Jesusita Oka will meet Pt back at [facility].  CSW called ambulance for non-emergency transport.   Frederico Hamman, LCSW [phone]

## 2012-05-07 NOTE — Progress Notes (Signed)
GASTROENTEROLOGY PROGRESS NOTE  Problem:   Coffee-ground emesis. Posthemorrhagic anemia, acute. Nonsteroidal prepyloric erosive gastropathy while on omeprazole 40 mg every morning. Acute renal dysfunction.  Subjective: Feeling okay. Tolerating solid diet.  Objective: Hemoglobin stable. BUN and creatinine have crested and are coming down slightly.  Assessment: Doing satisfactorily from the GI tract standpoint  Plan: Convert to oral pantoprazole. Patient should go home on either omeprazole 40 twice a day or pantoprazole 40 twice a day.   I feel the sucralfate should be stopped when the patient goes home.  I will sign off at this time. I do not feel that specific GI followup is needed, unless the patient develops recurrent symptoms. Call us as needed.  [name], M.D. 05/07/2012 12:04 PM

## 2012-05-07 NOTE — Discharge Summary (Signed)
Physician Discharge Summary  [name] [name] NWG:[id number] DOB: [birth year] DOA: 05/05/2012  PCP: Kimber Relic, MD  Admit date: 05/05/2012 Discharge date: 05/07/2012  Time spent: > 35 minutes  Recommendations for Outpatient Follow-up:  1. Please be sure to follow up with patient's blood pressure medication. I have discontinued patient's lisinopril due to an acute rise in creatinine which improved when medication was discontinued.   2. Also held B blocker due to lower blood pressures 3. Patient started on levaquin for suspected pna and improved on this regimen please decide whether patient will need more than 7 days of antibiotic treatment.  Discharge Diagnoses:  Principal Problem:  *UGI bleed Active Problems:  Dementia  Depression  Renal failure (ARF), acute on chronic   Discharge Condition: Stable  Diet recommendation: Low sodium heart healthy, low fiber  Filed Weights   05/05/12 0706 05/05/12 1442 05/07/12 0510  Weight: 70.3 kg (154 lb 15.7 oz) 69.854 kg (154 lb) 72.8 kg (160 lb 7.9 oz)    History of present illness:  From original HPI: [name] [name] is a 78 y.o. female who presents to the ED with 1 day history of 2 to 4 episodes per day of coffee ground emesis Subsequently GI was consulted and further recommendations.  Patient had elevation in WBC and chest x ray showed associated left lower lobe opacity likely atelectasis although given elevated WBC levels patient was placed on levaquin  Hospital Course:   1. UGI bleed: Per GI at this juncture. Per their recommendations:   Recommendation: N.p.o. until tomorrow morning, except meds and ice chips, to allow some time for healing to begin. Continue high-dose PPI and sucralfate. Hopefully can start clear liquids tomorrow and, if tolerated, she could eat a low residue supper by evening tomorrow for Christmas dinner. I estimate that she will need to be in the hospital approximately 48 hours, based on her endoscopic appearance. I  would discharge her on omeprazole 40 mg twice daily (was on 40 mg once daily prior to admission). 1. Continue current antipeptic therapy  2. Try to advance diet, as tolerated (ordered). I am recommending low residue for the time being, to diminish the amount of gastric motility required for gastric emptying.    ARF: Etiology likely related to recent history of lower blood pressures with nephrotoxic agents lasix and lisinopril.  Currently creatinine improved with cessation of lisinopril and lasix.  At this juncture will hold medication.  Levaquin to be administered qod. Per nursing patient was incontinent and producing urine.  I expect creatinine to continue to improve once patient completes her antibiotic regimen and off of lasix and lisinopril.   Dementia: Stable continue home regimen  Hypothyroidism: check tsh, otherwise continue current home regimen synthroid.  HTN: Will hold lisinopril, lasix, and B blocker at this point. Would recommend that patient continue to have her blood pressures monitored at SNF and PCP adjust medications pending values.  Procedures: Upper endoscopy: The main finding was erosive change in the prepyloric area consistent with exposure to nonsteroidal anti-inflammatory drugs. No active bleeding. Moderately large hiatal hernia.  Consultations:  Gastroenterology: Dr. Matthias Hughs  Discharge Exam: Filed Vitals:   05/06/12 2247 05/07/12 0415 05/07/12 0510 05/07/12 1036  BP:  106/69  100/61  Pulse: 66 64  58  Temp:  97.6 F (36.4 C)    TempSrc:  Oral    Resp: 18 18    Height:      Weight:   72.8 kg (160 lb 7.9 oz)   SpO2:  95%      General: Pt in NAD, Alert and Oriented Cardiovascular: RRR, No MRG Respiratory: No wheezes, no increased work of breathing, breath sounds auscultated BL  Discharge Instructions  Discharge Orders    Future Orders Please Complete By Expires   Diet - low sodium heart healthy      Increase activity slowly      Discharge instructions       Comments:   Please be sure to follow up with your primary care physician as you will need to have some medications restarted and your creatinine will need to be rechecked.   Call MD for:  temperature >100.4      Call MD for:  persistant nausea and vomiting      Call MD for:  redness, tenderness, or signs of infection (pain, swelling, redness, odor or green/yellow discharge around incision site)      Call MD for:  difficulty breathing, headache or visual disturbances      Call MD for:  extreme fatigue      (HEART FAILURE PATIENTS) Call MD:  Anytime you have any of the following symptoms: 1) 3 pound weight gain in 24 hours or 5 pounds in 1 week 2) shortness of breath, with or without a dry hacking cough 3) swelling in the hands, feet or stomach 4) if you have to sleep on extra pillows at night in order to breathe.          Medication List     As of 05/07/2012 10:48 AM    STOP taking these medications         amoxicillin 500 MG capsule   Commonly known as: AMOXIL      aspirin 81 MG tablet      furosemide 20 MG tablet   Commonly known as: LASIX      naproxen sodium 220 MG tablet   Commonly known as: ANAPROX      polyethylene glycol packet   Commonly known as: MIRALAX / GLYCOLAX      spironolactone 25 MG tablet   Commonly known as: ALDACTONE      TAKE these medications         acetaminophen 325 MG tablet   Commonly known as: TYLENOL   Take 2 tablets (650 mg total) by mouth every 8 (eight) hours.      acyclovir 200 MG capsule   Commonly known as: ZOVIRAX   Take 200 mg by mouth daily.      calcium-vitamin D 500-200 MG-UNIT per tablet   Commonly known as: OSCAL WITH D   Take 1 tablet by mouth daily.      DULoxetine 30 MG capsule   Commonly known as: CYMBALTA   Take 30 mg by mouth 2 (two) times daily.      Fluticasone-Salmeterol 100-50 MCG/DOSE Aepb   Commonly known as: ADVAIR   Inhale 1 puff into the lungs every 12 (twelve) hours.      gabapentin 100 MG capsule     Commonly known as: NEURONTIN   Take 100 mg by mouth 3 (three) times daily.      hydroxypropyl methylcellulose 2.5 % ophthalmic solution   Commonly known as: ISOPTO TEARS   Place 1 drop into both eyes every 4 (four) hours.      isosorbide mononitrate 30 MG 24 hr tablet   Commonly known as: IMDUR   Take 30 mg by mouth daily.      levothyroxine 25 MCG tablet   Commonly known as:  SYNTHROID, LEVOTHROID   Take 25 mcg by mouth daily.      memantine 5 MG tablet   Commonly known as: NAMENDA   Take 5 mg by mouth 2 (two) times daily.      omeprazole 20 MG capsule   Commonly known as: PRILOSEC   Take 2 capsules (40 mg total) by mouth 2 (two) times daily.      sucralfate 1 GM/10ML suspension   Commonly known as: CARAFATE   Take 10 mLs (1 g total) by mouth 4 (four) times daily -  with meals and at bedtime.      tiotropium 18 MCG inhalation capsule   Commonly known as: SPIRIVA   Place 18 mcg into inhaler and inhale daily.      traMADol 50 MG tablet   Commonly known as: ULTRAM   Take 50 mg by mouth every 8 (eight) hours as needed. For pain          The results of significant diagnostics from this hospitalization (including imaging, microbiology, ancillary and laboratory) are listed below for reference.    Significant Diagnostic Studies: Dg Chest Port 1 View  05/05/2012  *RADIOLOGY REPORT*  Clinical Data: Hematemesis  PORTABLE CHEST - 1 VIEW  Comparison: 11/04/2011  Findings: Chronic interstitial markings.  Suspected small to moderate left pleural effusion, mildly increased.  Associated left lower lobe opacity, likely atelectasis.  No pneumothorax.  Stable mild cardiomegaly.  IMPRESSION: Small to moderate left pleural effusion, mildly increased.  Associated left lower lobe opacity, likely atelectasis.   Original Report Authenticated By: Charline Bills, M.D.     Microbiology: No results found for this or any previous visit (from the past 240 hour(s)).   Labs: Basic Metabolic  Panel:  Lab 05/07/12 0508 05/06/12 1529 05/06/12 0500 05/05/12 0233  NA 135 134* 138 138  K 3.5 3.8 3.9 3.8  CL 104 101 106 103  CO2 23 22 22 25   GLUCOSE 87 84 85 120*  BUN 42* 47* 46* 28*  CREATININE 1.54* 1.67* 1.46* 0.86  CALCIUM 8.6 8.5 8.5 9.2  MG -- -- -- --  PHOS -- 3.3 -- --   Liver Function Tests:  Lab 05/06/12 1529 05/05/12 0233  AST -- 18  ALT -- 12  ALKPHOS -- 87  BILITOT -- 0.3  PROT -- 6.5  ALBUMIN 2.3* 3.3*   No results found for this basename: LIPASE:5,AMYLASE:5 in the last 168 hours No results found for this basename: AMMONIA:5 in the last 168 hours CBC:  Lab 05/07/12 0508 05/06/12 0500 05/05/12 1202 05/05/12 0233  WBC 11.0* 12.7* -- 16.8*  NEUTROABS -- -- -- --  HGB 11.7* 11.6* 13.3 14.8  HCT 34.7* 34.8* -- 43.8  MCV 94.3 94.1 -- 94.8  PLT 251 252 -- 310   Cardiac Enzymes: No results found for this basename: CKTOTAL:5,CKMB:5,CKMBINDEX:5,TROPONINI:5 in the last 168 hours BNP: BNP (last 3 results) No results found for this basename: PROBNP:3 in the last 8760 hours CBG:  Lab 05/07/12 0757  GLUCAP 86       Signed:  Penny Pia  [facility] 05/07/2012, 10:48 AM

## 2012-05-07 NOTE — Progress Notes (Signed)
Pt discharged per MD order and protocol. Pt transported back to facility per [facility]. Pt's son, Jesusita Oka, updated and aware.

## 2012-07-13 LAB — CBC AND DIFFERENTIAL
HCT: 35 % — AB (ref 36–46)
Hemoglobin: 11.9 g/dL — AB (ref 12.0–16.0)
Platelets: 404 10*3/uL — AB (ref 150–399)
WBC: 11.7 10^3/mL

## 2012-07-13 LAB — HEPATIC FUNCTION PANEL
ALT: 13 U/L (ref 7–35)
AST: 19 U/L (ref 13–35)
Alkaline Phosphatase: 110 U/L (ref 25–125)
Bilirubin, Total: 0.3 mg/dL

## 2012-07-13 LAB — BASIC METABOLIC PANEL: Creatinine: 0.9 mg/dL (ref 0.5–1.1)

## 2012-07-31 ENCOUNTER — Non-Acute Institutional Stay (SKILLED_NURSING_FACILITY): Payer: Medicare Other | Admitting: Nurse Practitioner

## 2012-07-31 DIAGNOSIS — L989 Disorder of the skin and subcutaneous tissue, unspecified: Secondary | ICD-10-CM

## 2012-07-31 DIAGNOSIS — F329 Major depressive disorder, single episode, unspecified: Secondary | ICD-10-CM

## 2012-07-31 DIAGNOSIS — F0390 Unspecified dementia without behavioral disturbance: Secondary | ICD-10-CM

## 2012-07-31 DIAGNOSIS — F3289 Other specified depressive episodes: Secondary | ICD-10-CM

## 2012-07-31 DIAGNOSIS — F32A Depression, unspecified: Secondary | ICD-10-CM

## 2012-07-31 NOTE — Progress Notes (Signed)
Subjective:    Patient ID: [name] [name], female    DOB: 01/01/1918, 79 y.o.   MRN: [id number]  HPI 054.9-HERPES SIMPLEX  Hx of it. On Zovirax indefinitely.  No flare ups.  244.9-HYPOTHYROIDISM The hypothyroidism is stable.on Levothyroxine 12.24mcg, TSH 3.598 05/18/12 272.4-HYPERLIPIDEMIA The patient's most recent LDL is at goal.off Lipitor R>B 290.0-DEMENTIA, SENILE The dementia remains stable and continues to function adequately in the current living environment with supervision.on Namenda  300.00-ANXIETY The anxiety remains stable. On Xanax 0.5mg  qhs  prn and Duloxetine 60mg /day. 311-DEPRESSIVE DISORDER NEC The depression remains stable.On Duloxetine.  332.0-PARKINSONISM  fine resting tremor noted in fingers--not disabling.  356.9-NEUROPATHY, PERIPHERAL  neck, shoulder, lower leg pain, left hip pain--s/p Ortho consultation, Gabapentin relieves some symptoms 401.9-HTN UNSPECIFIED The blood pressure readings taken outside the office since the last visit have not been controlled in the target range. Imdur 414.01-CAD  dc'd ASA, statin R>B. On Imdur 428.0-CONGESTIVE HEART FAILURE The congestive heart failure has worsened.hx EF 61%, BNP 117.2 08/14/11 530.11-GERD The patient's dyspeptic symptoms remain stable.on Omeprazole bid since 11/05/11 and Carafate 564.00-CONSTIPATION The symptoms are stable. 723.1-PAIN NECK  r/t cervical spine stenosis, hx of , taking Gabapentin and Tylenol.  782.3-EDEMA The edema has worsened.L>R trace, dc'd Lasix 40mg  daily while in hospital due to acute on chronic renal insufficiency.  786.07-WHEEZING  no lung function test done in the past, but chronic cough and wheezing with  chest CT showed COPD 2007,  she apparent much better since Spiriva and Advair started.  Review of Systems  Constitutional: Negative.   HENT: Positive for neck pain and neck stiffness.   Eyes: Negative.   Respiratory: Positive for cough and wheezing.        Chronic cough, has been on HHI to manage  wheezes and cough  Gastrointestinal: Positive for constipation.  Genitourinary: Positive for frequency.  Musculoskeletal: Positive for arthralgias and gait problem.       Left hip s/p ORIF and chronic pain, had Ortho consultation and no further surgical procedure recommended  Skin: Negative.   Neurological: Positive for numbness. Negative for tremors and weakness.  Hematological: Negative.   Psychiatric/Behavioral: Positive for hallucinations.       Remote hx of hallucination treated with Seroquel    Objective:   Physical Exam  Constitutional: She is oriented to person, place, and time. She appears well-developed and well-nourished.  HENT:  Head: Normocephalic and atraumatic.  Eyes: Conjunctivae and EOM are normal. Pupils are equal, round, and reactive to light.  Neck: Normal range of motion. No JVD present. No thyromegaly present.  Chronic decreased lateral ROM of the neck   Cardiovascular: Normal rate, regular rhythm and normal heart sounds.   Pulmonary/Chest: Effort normal. She has rales.  Bibasilar moist rales and occasional wheezes  Abdominal: Soft. Bowel sounds are normal.  Musculoskeletal: She exhibits edema and tenderness.  Mainly pain in neck and left hip  Lymphadenopathy:    She has no cervical adenopathy.  Neurological: She is alert and oriented to person, place, and time. She has normal reflexes. No cranial nerve deficit.  Skin: Lesion noted.     New skin lesion at bridge of her nose, new, scaly, superficial, non healing.  Bilateral lower legs chronic venous dermatitis.   Psychiatric: Cognition and memory are impaired. She exhibits abnormal recent memory.          Assessment & Plan:   Dementia: continue SNF for care needs, decline gradually in the past year.   Parkinson disease not disabling.  Depression stable.   Chronic pain neck and right hip--managing pain  Skin lesion of face on her bridge nose-Dermatology referral.

## 2012-08-25 ENCOUNTER — Non-Acute Institutional Stay (SKILLED_NURSING_FACILITY): Payer: Medicare Other | Admitting: Nurse Practitioner

## 2012-08-25 DIAGNOSIS — E039 Hypothyroidism, unspecified: Secondary | ICD-10-CM | POA: Insufficient documentation

## 2012-08-25 DIAGNOSIS — F0390 Unspecified dementia without behavioral disturbance: Secondary | ICD-10-CM

## 2012-08-25 DIAGNOSIS — G629 Polyneuropathy, unspecified: Secondary | ICD-10-CM

## 2012-08-25 DIAGNOSIS — I25810 Atherosclerosis of coronary artery bypass graft(s) without angina pectoris: Secondary | ICD-10-CM

## 2012-08-25 DIAGNOSIS — F329 Major depressive disorder, single episode, unspecified: Secondary | ICD-10-CM

## 2012-08-25 DIAGNOSIS — K219 Gastro-esophageal reflux disease without esophagitis: Secondary | ICD-10-CM

## 2012-08-25 DIAGNOSIS — G589 Mononeuropathy, unspecified: Secondary | ICD-10-CM

## 2012-08-25 HISTORY — DX: Hypothyroidism, unspecified: E03.9

## 2012-08-25 NOTE — Assessment & Plan Note (Signed)
Stable on Gabapentin 100mg tid and Tylenol 650mg tid.   

## 2012-08-25 NOTE — Assessment & Plan Note (Signed)
Stable on Cymbalta 30mg bid   

## 2012-08-25 NOTE — Progress Notes (Signed)
Patient ID: [name] [name], female   DOB: [birth year], 79 y.o.   MRN: [id number]  Chief Complaint:  Chief Complaint  Patient presents with  . Medical Managment of Chronic Issues    gerd     HPI:    Problem List Items Addressed This Visit     ICD-9-CM   GERD (gastroesophageal reflux disease) - Primary     Hx GI bleed, stable, Hgb11.9 07/14/11, will decrease Carafate to 1gm bid, continue Omeprazole 20mg  bid.     Dementia     Gradual decline, takes Namenda and lives in SNF    Neuropathy     Stable on Gabapentin 100mg  tid and Tylenol 650mg  tid.     Depression     Stable on Cymbalta 30mg  bid    Unspecified hypothyroidism     Corrected with Levothyroxine 12.60mcg, last TSH 3.598 05/18/12    CAD (coronary artery disease) of artery bypass graft     Stable on Imdur 30mg         Review of Systems:  Review of Systems  Constitutional: Negative for fever, chills, weight loss, malaise/fatigue and diaphoresis.  HENT: Positive for hearing loss and neck pain. Negative for ear pain, congestion and sore throat.   Eyes: Negative for pain, discharge and redness.  Respiratory: Positive for cough and shortness of breath. Negative for sputum production and wheezing.   Cardiovascular: Positive for leg swelling (trace in ankle) and PND. Negative for chest pain, orthopnea and claudication.  Gastrointestinal: Negative for heartburn, nausea, vomiting, abdominal pain, diarrhea, constipation and blood in stool.  Genitourinary: Positive for frequency. Negative for dysuria, urgency and flank pain.  Musculoskeletal: Positive for myalgias, back pain and joint pain. Negative for falls.  Skin: Negative for itching and rash.  Neurological: Negative for dizziness, tingling, tremors, speech change, focal weakness, seizures, loss of consciousness and weakness. Sensory change: periphearl neuropathy.  Endo/Heme/Allergies: Negative for environmental allergies and polydipsia. Does not bruise/bleed easily.    Psychiatric/Behavioral: Positive for memory loss. Negative for depression and hallucinations. The patient is not nervous/anxious and does not have insomnia.      Medications: Patient's Medications  New Prescriptions   No medications on file  Previous Medications   ACETAMINOPHEN (TYLENOL) 325 MG TABLET    Take 2 tablets (650 mg total) by mouth every 8 (eight) hours.   ACYCLOVIR (ZOVIRAX) 200 MG CAPSULE    Take 200 mg by mouth daily.   CALCIUM-VITAMIN D (OSCAL WITH D) 500-200 MG-UNIT PER TABLET    Take 1 tablet by mouth daily.   DULOXETINE (CYMBALTA) 30 MG CAPSULE    Take 30 mg by mouth 2 (two) times daily.   FLUTICASONE-SALMETEROL (ADVAIR) 100-50 MCG/DOSE AEPB    Inhale 1 puff into the lungs every 12 (twelve) hours.   GABAPENTIN (NEURONTIN) 100 MG CAPSULE    Take 100 mg by mouth 3 (three) times daily.   HYDROXYPROPYL METHYLCELLULOSE (ISOPTO TEARS) 2.5 % OPHTHALMIC SOLUTION    Place 1 drop into both eyes every 4 (four) hours.   ISOSORBIDE MONONITRATE (IMDUR) 30 MG 24 HR TABLET    Take 30 mg by mouth daily.   LEVOFLOXACIN (LEVAQUIN) 500 MG TABLET    Take 1 tablet (500 mg total) by mouth every other day.   LEVOTHYROXINE (SYNTHROID, LEVOTHROID) 25 MCG TABLET    Take 25 mcg by mouth daily.   MEMANTINE (NAMENDA) 5 MG TABLET    Take 5 mg by mouth 2 (two) times daily.   OMEPRAZOLE (PRILOSEC) 20 MG CAPSULE  Take 2 capsules (40 mg total) by mouth 2 (two) times daily.   SUCRALFATE (CARAFATE) 1 GM/10ML SUSPENSION    Take 10 mLs (1 g total) by mouth 4 (four) times daily -  with meals and at bedtime.   TIOTROPIUM (SPIRIVA) 18 MCG INHALATION CAPSULE    Place 18 mcg into inhaler and inhale daily.   TRAMADOL (ULTRAM) 50 MG TABLET    Take 50 mg by mouth every 8 (eight) hours as needed. For pain  Modified Medications   No medications on file  Discontinued Medications   No medications on file     Physical Exam: Physical Exam  Constitutional: She is oriented to person, place, and time. She appears  well-developed and well-nourished.  HENT:  Head: Normocephalic and atraumatic.  Eyes: Conjunctivae and EOM are normal. Pupils are equal, round, and reactive to light.  Neck: Normal range of motion. No JVD present. No thyromegaly present.  Chronic decreased lateral ROM of the neck   Cardiovascular: Normal rate, regular rhythm and normal heart sounds.   No murmur heard. Pulmonary/Chest: Effort normal. She has rales.  Bibasilar moist rales and occasional wheezes  Abdominal: Soft. Bowel sounds are normal.  Musculoskeletal: She exhibits edema and tenderness.  Mainly pain in neck and left hip  Lymphadenopathy:    She has no cervical adenopathy.  Neurological: She is alert and oriented to person, place, and time. She has normal reflexes. She displays normal reflexes. No cranial nerve deficit. She exhibits normal muscle tone. Coordination normal.  Skin: Skin is warm and dry. Lesion noted. No rash noted. No erythema.     New skin lesion at bridge of her nose, new, scaly, superficial, non healing.  Bilateral lower legs chronic venous dermatitis.   Psychiatric: Cognition and memory are impaired. She exhibits abnormal recent memory.     There were no vitals filed for this visit.    Labs reviewed: Basic Metabolic Panel:  Recent Labs  16/10/96 1718 11/01/11 2111 11/02/11 0510 11/02/11 1352  05/06/12 0500 05/06/12 1529 05/07/12 0508  NA 141  --  142 139  < > 138 134* 135  K 4.3  --  3.6 3.4*  < > 3.9 3.8 3.5  CL 102  --  106 105  < > 106 101 104  CO2 28  --  25 26  < > 22 22 23   GLUCOSE 80  --  112* 81  < > 85 84 87  BUN 25*  --  23 24*  < > 46* 47* 42*  CREATININE 1.50* 1.30* 1.10 1.42*  < > 1.46* 1.67* 1.54*  CALCIUM 9.5  --  8.6 7.9*  < > 8.5 8.5 8.6  MG  --   --  2.0 1.9  --   --   --   --   PHOS  --   --   --   --   --   --  3.3  --   TSH  --  2.471  --   --   --   --   --  4.612*  < > = values in this interval not displayed.  Liver Function Tests:  Recent Labs   11/02/11 0510 05/05/12 0233 05/06/12 1529  AST 19 18  --   ALT 14 12  --   ALKPHOS 91 87  --   BILITOT 0.3 0.3  --   PROT 5.5* 6.5  --   ALBUMIN 2.4* 3.3* 2.3*    CBC:  Recent Labs  11/01/11 1718  05/05/12 0233 05/05/12 1202 05/06/12 0500 05/07/12 0508  WBC 10.3  < > 16.8*  --  12.7* 11.0*  NEUTROABS 6.3  --   --   --   --   --   HGB 14.7  < > 14.8 13.3 11.6* 11.7*  HCT 44.4  < > 43.8  --  34.8* 34.7*  MCV 98.2  < > 94.8  --  94.1 94.3  PLT 276  < > 310  --  252 251  < > = values in this interval not displayed.  Anemia Panel: No results found for this basename: IRON, FOLATE, VITAMINB12,  in the last 8760 hours  Significant Diagnostic Results:     Assessment/Plan GERD (gastroesophageal reflux disease) Hx GI bleed, stable, Hgb11.9 07/14/11, will decrease Carafate to 1gm bid, continue Omeprazole 20mg  bid.   Neuropathy Stable on Gabapentin 100mg  tid and Tylenol 650mg  tid.   Unspecified hypothyroidism Corrected with Levothyroxine 12.78mcg, last TSH 3.598 05/18/12  CAD (coronary artery disease) of artery bypass graft Stable on Imdur 30mg    Depression Stable on Cymbalta 30mg  bid  Dementia Gradual decline, takes Namenda and lives in [place]     Family/ staff Communication: continue supportive care   Goals of care: SNF   Labs/tests ordered none

## 2012-08-25 NOTE — Assessment & Plan Note (Signed)
Hx GI bleed, stable, Hgb11.9 07/14/11, will decrease Carafate to 1gm bid, continue Omeprazole 20mg  bid.

## 2012-08-25 NOTE — Assessment & Plan Note (Signed)
Stable on Imdur 30mg                      

## 2012-08-25 NOTE — Assessment & Plan Note (Signed)
Gradual decline, takes Namenda and lives in SNF.  

## 2012-08-25 NOTE — Assessment & Plan Note (Signed)
Corrected with Levothyroxine 12.90mcg, last TSH 3.598 05/18/12

## 2012-09-25 ENCOUNTER — Non-Acute Institutional Stay (SKILLED_NURSING_FACILITY): Payer: Medicare Other | Admitting: Nurse Practitioner

## 2012-09-25 DIAGNOSIS — F411 Generalized anxiety disorder: Secondary | ICD-10-CM

## 2012-09-25 DIAGNOSIS — F419 Anxiety disorder, unspecified: Secondary | ICD-10-CM

## 2012-09-25 DIAGNOSIS — L259 Unspecified contact dermatitis, unspecified cause: Secondary | ICD-10-CM

## 2012-09-25 DIAGNOSIS — D649 Anemia, unspecified: Secondary | ICD-10-CM

## 2012-09-25 DIAGNOSIS — G589 Mononeuropathy, unspecified: Secondary | ICD-10-CM

## 2012-09-25 DIAGNOSIS — G629 Polyneuropathy, unspecified: Secondary | ICD-10-CM

## 2012-09-25 DIAGNOSIS — K219 Gastro-esophageal reflux disease without esophagitis: Secondary | ICD-10-CM

## 2012-09-25 DIAGNOSIS — G8929 Other chronic pain: Secondary | ICD-10-CM

## 2012-09-25 DIAGNOSIS — F0390 Unspecified dementia without behavioral disturbance: Secondary | ICD-10-CM

## 2012-09-25 DIAGNOSIS — L309 Dermatitis, unspecified: Secondary | ICD-10-CM

## 2012-09-25 DIAGNOSIS — E039 Hypothyroidism, unspecified: Secondary | ICD-10-CM

## 2012-09-25 DIAGNOSIS — I25810 Atherosclerosis of coronary artery bypass graft(s) without angina pectoris: Secondary | ICD-10-CM

## 2012-09-25 NOTE — Assessment & Plan Note (Signed)
Stable on Imdur 30mg                      

## 2012-09-25 NOTE — Assessment & Plan Note (Signed)
Repeat CBC

## 2012-09-25 NOTE — Assessment & Plan Note (Addendum)
Hx GI bleed, stable, Hgb11.9 07/14/11, continue  Carafate to 1gm bid, continue Omeprazole 20mg  bid.

## 2012-09-25 NOTE — Assessment & Plan Note (Addendum)
Corrected with Levothyroxine 12.12mcg, last TSH 3.598 05/18/12. Update CMP and TSH

## 2012-09-25 NOTE — Assessment & Plan Note (Signed)
Mycolog II bid to tip of nose skin rash

## 2012-09-25 NOTE — Assessment & Plan Note (Signed)
Stable on Cymbalta 30mg bid   

## 2012-09-25 NOTE — Assessment & Plan Note (Signed)
Stable on Gabapentin 100mg tid and Tylenol 650mg tid.   

## 2012-09-25 NOTE — Assessment & Plan Note (Signed)
Chronic left hip pain-worse with weight bearing and movement--Tylenol is not adequate, adding Tramadol 50mg  at 2pm

## 2012-09-25 NOTE — Assessment & Plan Note (Addendum)
Gradual decline, takes Namenda and lives in [place]. Check TSH

## 2012-09-25 NOTE — Progress Notes (Signed)
Patient ID: Randolm Idol, female   DOB: Mar 29, 1918, 79 y.o.   MRN: [id number]  Chief Complaint:  Chief Complaint  Patient presents with  . Medical Managment of Chronic Issues    left hip pain.      HPI:  Problem List Items Addressed This Visit   GERD (gastroesophageal reflux disease)     Hx GI bleed, stable, Hgb11.9 07/14/11, continue  Carafate to 1gm bid, continue Omeprazole 20mg  bid.       Dementia     Gradual decline, takes Namenda and lives in [place]. Check TSH      Anemia     Repeat CBC    Neuropathy     Stable on Gabapentin 100mg  tid and Tylenol 650mg  tid.       Anxiety     Stable on Cymbalta 30mg  bid      CAD (coronary artery disease) of artery bypass graft     Stable on Imdur 30mg        Dermatitis     Mycolog II bid to tip of nose skin rash    Chronic pain - Primary (Chronic)     Chronic left hip pain-worse with weight bearing and movement--Tylenol is not adequate, adding Tramadol 50mg  at 2pm    Unspecified hypothyroidism (Chronic)     Corrected with Levothyroxine 12.64mcg, last TSH 3.598 05/18/12. Update CMP and TSH         Review of Systems:  Review of Systems  Constitutional: Negative for fever, chills, weight loss, malaise/fatigue and diaphoresis.  HENT: Positive for hearing loss and neck pain. Negative for ear pain, congestion and sore throat.   Eyes: Negative for pain, discharge and redness.  Respiratory: Positive for cough and shortness of breath. Negative for sputum production and wheezing.   Cardiovascular: Positive for leg swelling (trace in ankle, chronic, venous insufficiency) and PND. Negative for chest pain, orthopnea and claudication.  Gastrointestinal: Negative for heartburn, nausea, vomiting, abdominal pain, diarrhea, constipation and blood in stool.  Genitourinary: Positive for frequency. Negative for dysuria, urgency and flank pain.  Musculoskeletal: Positive for myalgias, back pain and joint pain (left hip pain. ). Negative for falls.    Skin: Negative for itching and rash.       Tip of nose scratched marks.   Neurological: Negative for dizziness, tingling, tremors, speech change, focal weakness, seizures, loss of consciousness and weakness. Sensory change: periphearl neuropathy.  Endo/Heme/Allergies: Negative for environmental allergies and polydipsia. Does not bruise/bleed easily.  Psychiatric/Behavioral: Positive for memory loss. Negative for depression and hallucinations. The patient is not nervous/anxious and does not have insomnia.      Medications: Patient's Medications  New Prescriptions   No medications on file  Previous Medications   ACETAMINOPHEN (TYLENOL) 325 MG TABLET    Take 2 tablets (650 mg total) by mouth every 8 (eight) hours.   ACYCLOVIR (ZOVIRAX) 200 MG CAPSULE    Take 200 mg by mouth daily.   CALCIUM-VITAMIN D (OSCAL WITH D) 500-200 MG-UNIT PER TABLET    Take 1 tablet by mouth daily.   DULOXETINE (CYMBALTA) 30 MG CAPSULE    Take 30 mg by mouth 2 (two) times daily.   FLUTICASONE-SALMETEROL (ADVAIR) 100-50 MCG/DOSE AEPB    Inhale 1 puff into the lungs every 12 (twelve) hours.   GABAPENTIN (NEURONTIN) 100 MG CAPSULE    Take 100 mg by mouth 3 (three) times daily.   HYDROXYPROPYL METHYLCELLULOSE (ISOPTO TEARS) 2.5 % OPHTHALMIC SOLUTION    Place 1 drop into both eyes every  4 (four) hours.   ISOSORBIDE MONONITRATE (IMDUR) 30 MG 24 HR TABLET    Take 30 mg by mouth daily.   LEVOFLOXACIN (LEVAQUIN) 500 MG TABLET    Take 1 tablet (500 mg total) by mouth every other day.   LEVOTHYROXINE (SYNTHROID, LEVOTHROID) 25 MCG TABLET    Take 25 mcg by mouth daily.   MEMANTINE (NAMENDA) 5 MG TABLET    Take 5 mg by mouth 2 (two) times daily.   OMEPRAZOLE (PRILOSEC) 20 MG CAPSULE    Take 2 capsules (40 mg total) by mouth 2 (two) times daily.   SUCRALFATE (CARAFATE) 1 GM/10ML SUSPENSION    Take 10 mLs (1 g total) by mouth 4 (four) times daily -  with meals and at bedtime.   TIOTROPIUM (SPIRIVA) 18 MCG INHALATION CAPSULE     Place 18 mcg into inhaler and inhale daily.   TRAMADOL (ULTRAM) 50 MG TABLET    Take 50 mg by mouth every 8 (eight) hours as needed. For pain  Modified Medications   No medications on file  Discontinued Medications   No medications on file     Physical Exam: Physical Exam  Constitutional: She is oriented to person, place, and time. She appears well-developed and well-nourished.  HENT:  Head: Normocephalic and atraumatic.  Eyes: Conjunctivae and EOM are normal. Pupils are equal, round, and reactive to light.  Neck: Normal range of motion. No JVD present. No thyromegaly present.  Chronic decreased lateral ROM of the neck   Cardiovascular: Normal rate, regular rhythm and normal heart sounds.   No murmur heard. Pulmonary/Chest: Effort normal. She has rales.  Bibasilar moist rales and occasional wheezes  Abdominal: Soft. Bowel sounds are normal.  Musculoskeletal: She exhibits edema and tenderness.  Mainly pain in neck and left hip  Lymphadenopathy:    She has no cervical adenopathy.  Neurological: She is alert and oriented to person, place, and time. She has normal reflexes. She displays normal reflexes. No cranial nerve deficit. She exhibits normal muscle tone. Coordination normal.  Skin: Skin is warm and dry. Lesion noted. No rash noted. No erythema.     New skin lesion at bridge of her nose, new, scaly, superficial, non healing.  Bilateral lower legs chronic venous dermatitis.   Psychiatric: Cognition and memory are impaired. She exhibits abnormal recent memory.     Filed Vitals:   09/25/12 1335  BP: 162/80  Pulse: 20  Temp: 98.4 F (36.9 C)  TempSrc: Tympanic  Resp: 20      Labs reviewed: Basic Metabolic Panel:  Recent Labs  16/10/96 1718 11/01/11 2111 11/02/11 0510 11/02/11 1352  05/06/12 0500 05/06/12 1529 05/07/12 0508 07/13/12  NA 141  --  142 139  < > 138 134* 135 141  K 4.3  --  3.6 3.4*  < > 3.9 3.8 3.5 4.4  CL 102  --  106 105  < > 106 101 104  --    CO2 28  --  25 26  < > 22 22 23   --   GLUCOSE 80  --  112* 81  < > 85 84 87  --   BUN 25*  --  23 24*  < > 46* 47* 42* 24*  CREATININE 1.50* 1.30* 1.10 1.42*  < > 1.46* 1.67* 1.54* 0.9  CALCIUM 9.5  --  8.6 7.9*  < > 8.5 8.5 8.6  --   MG  --   --  2.0 1.9  --   --   --   --   --  PHOS  --   --   --   --   --   --  3.3  --   --   TSH  --  2.471  --   --   --   --   --  4.612*  --   < > = values in this interval not displayed.  Liver Function Tests:  Recent Labs  11/02/11 0510 05/05/12 0233 05/06/12 1529 07/13/12  AST 19 18  --  19  ALT 14 12  --  13  ALKPHOS 91 87  --  110  BILITOT 0.3 0.3  --   --   PROT 5.5* 6.5  --   --   ALBUMIN 2.4* 3.3* 2.3*  --     CBC:  Recent Labs  11/01/11 1718  05/05/12 0233  05/06/12 0500 05/07/12 0508 07/13/12  WBC 10.3  < > 16.8*  --  12.7* 11.0* 11.7  NEUTROABS 6.3  --   --   --   --   --   --   HGB 14.7  < > 14.8  < > 11.6* 11.7* 11.9*  HCT 44.4  < > 43.8  --  34.8* 34.7* 35*  MCV 98.2  < > 94.8  --  94.1 94.3  --   PLT 276  < > 310  --  252 251 404*  < > = values in this interval not displayed.  Anemia Panel: No results found for this basename: IRON, FOLATE, VITAMINB12,  in the last 8760 hours  Significant Diagnostic Results:     Assessment/Plan Chronic pain Chronic left hip pain-worse with weight bearing and movement--Tylenol is not adequate, adding Tramadol 50mg  at 2pm  Unspecified hypothyroidism Corrected with Levothyroxine 12.48mcg, last TSH 3.598 05/18/12. Update CMP and TSH    CAD (coronary artery disease) of artery bypass graft Stable on Imdur 30mg      GERD (gastroesophageal reflux disease) Hx GI bleed, stable, Hgb11.9 07/14/11, continue  Carafate to 1gm bid, continue Omeprazole 20mg  bid.     Anxiety Stable on Cymbalta 30mg  bid    Dementia Gradual decline, takes Namenda and lives in [place]. Check TSH    Neuropathy Stable on Gabapentin 100mg  tid and Tylenol 650mg  tid.     Dermatitis Mycolog II bid to  tip of nose skin rash  Anemia Repeat CBC      Family/ staff Communication: none   Goals of care: SNF   Labs/tests ordered CBC, CMP, TSH

## 2012-09-28 LAB — BASIC METABOLIC PANEL
Glucose: 104 mg/dL
Potassium: 3.8 mmol/L (ref 3.4–5.3)
Sodium: 141 mmol/L (ref 137–147)

## 2012-09-28 LAB — CBC AND DIFFERENTIAL
HCT: 36 % (ref 36–46)
Hemoglobin: 11.6 g/dL — AB (ref 12.0–16.0)

## 2012-09-28 LAB — HEPATIC FUNCTION PANEL
ALT: 11 U/L (ref 7–35)
AST: 17 U/L (ref 13–35)
Alkaline Phosphatase: 87 U/L (ref 25–125)
Bilirubin, Total: 0.3 mg/dL

## 2012-10-16 ENCOUNTER — Non-Acute Institutional Stay (SKILLED_NURSING_FACILITY): Payer: Medicare Other | Admitting: Nurse Practitioner

## 2012-10-16 DIAGNOSIS — J42 Unspecified chronic bronchitis: Secondary | ICD-10-CM

## 2012-10-16 DIAGNOSIS — I25810 Atherosclerosis of coronary artery bypass graft(s) without angina pectoris: Secondary | ICD-10-CM

## 2012-10-16 DIAGNOSIS — F329 Major depressive disorder, single episode, unspecified: Secondary | ICD-10-CM

## 2012-10-16 DIAGNOSIS — G629 Polyneuropathy, unspecified: Secondary | ICD-10-CM

## 2012-10-16 DIAGNOSIS — G589 Mononeuropathy, unspecified: Secondary | ICD-10-CM

## 2012-10-16 DIAGNOSIS — E039 Hypothyroidism, unspecified: Secondary | ICD-10-CM

## 2012-10-16 DIAGNOSIS — A6000 Herpesviral infection of urogenital system, unspecified: Secondary | ICD-10-CM

## 2012-10-16 DIAGNOSIS — A6009 Herpesviral infection of other urogenital tract: Secondary | ICD-10-CM | POA: Insufficient documentation

## 2012-10-16 DIAGNOSIS — G8929 Other chronic pain: Secondary | ICD-10-CM

## 2012-10-16 DIAGNOSIS — Z66 Do not resuscitate: Secondary | ICD-10-CM

## 2012-10-16 DIAGNOSIS — K219 Gastro-esophageal reflux disease without esophagitis: Secondary | ICD-10-CM

## 2012-10-16 HISTORY — DX: Herpesviral infection of other urogenital tract: A60.09

## 2012-10-16 HISTORY — DX: Unspecified chronic bronchitis: J42

## 2012-10-16 NOTE — Assessment & Plan Note (Signed)
Hx GI bleed, stable, Hgb11.9 07/14/11, continue  Carafate to 1gm bid, continue Omeprazole 20mg bid.        

## 2012-10-16 NOTE — Assessment & Plan Note (Signed)
Chronic left hip pain-worse with weight bearing and movement- Tramadol 50mg  at 2pm and Tylenol 650mg  tid.

## 2012-10-16 NOTE — Assessment & Plan Note (Signed)
Gradual decline, takes Namenda and lives in SNF.  

## 2012-10-16 NOTE — Progress Notes (Signed)
Patient ID: [name] [name], female   DOB: [birth year], 79 y.o.   MRN: [id number]  Chief Complaint:  Chief Complaint  Patient presents with  . Medical Managment of Chronic Issues     HPI:   Problem List Items Addressed This Visit   Chronic pain (Chronic)     Chronic left hip pain-worse with weight bearing and movement- Tramadol 50mg  at 2pm and Tylenol 650mg  tid.       Unspecified hypothyroidism (Chronic)     Corrected with Levothyroxine 12.81mcg, last TSH 3.598 05/18/12--4.214 09/28/12        GERD (gastroesophageal reflux disease)     Hx GI bleed, stable, Hgb11.9 07/14/11, continue  Carafate to 1gm bid, continue Omeprazole 20mg  bid.         Neuropathy     Stable on Gabapentin 100mg  tid and Tylenol 650mg  tid.         Depression     Stable on Cymbalta 30mg  bid      CAD (coronary artery disease) of artery bypass graft     Stable on Imdur 30mg          DNR (do not resuscitate) - Primary   Herpes genitalis in women     Taking Acyclovire 200mg  for suppression therapy--no flare up    Unspecified chronic bronchitis     Maintained on Spiriva and Advair.        Review of Systems:  Review of Systems  Constitutional: Negative for fever, chills, weight loss, malaise/fatigue and diaphoresis.  HENT: Positive for hearing loss and neck pain. Negative for ear pain, congestion and sore throat.   Eyes: Negative for pain, discharge and redness.  Respiratory: Positive for cough and shortness of breath. Negative for sputum production and wheezing.   Cardiovascular: Positive for leg swelling (trace in ankle, chronic, venous insufficiency) and PND. Negative for chest pain, orthopnea and claudication.  Gastrointestinal: Negative for heartburn, nausea, vomiting, abdominal pain, diarrhea, constipation and blood in stool.  Genitourinary: Positive for frequency. Negative for dysuria, urgency and flank pain.  Musculoskeletal: Positive for myalgias, back pain and joint pain (left hip pain.  ). Negative for falls.  Skin: Negative for itching and rash.       Tip of nose scratched marks--better with Mycolog II cream  Neurological: Negative for dizziness, tingling, tremors, speech change, focal weakness, seizures, loss of consciousness and weakness. Sensory change: periphearl neuropathy.  Endo/Heme/Allergies: Negative for environmental allergies and polydipsia. Does not bruise/bleed easily.  Psychiatric/Behavioral: Positive for memory loss. Negative for depression and hallucinations. The patient is not nervous/anxious and does not have insomnia.      Medications: Patient's Medications  New Prescriptions   No medications on file  Previous Medications   ACETAMINOPHEN (TYLENOL) 325 MG TABLET    Take 2 tablets (650 mg total) by mouth every 8 (eight) hours.   ACYCLOVIR (ZOVIRAX) 200 MG CAPSULE    Take 200 mg by mouth daily.   CALCIUM-VITAMIN D (OSCAL WITH D) 500-200 MG-UNIT PER TABLET    Take 1 tablet by mouth daily.   DULOXETINE (CYMBALTA) 30 MG CAPSULE    Take 30 mg by mouth 2 (two) times daily.   FLUTICASONE-SALMETEROL (ADVAIR) 100-50 MCG/DOSE AEPB    Inhale 1 puff into the lungs every 12 (twelve) hours.   GABAPENTIN (NEURONTIN) 100 MG CAPSULE    Take 100 mg by mouth 3 (three) times daily.   HYDROXYPROPYL METHYLCELLULOSE (ISOPTO TEARS) 2.5 % OPHTHALMIC SOLUTION    Place 1 drop into both eyes every 4 (four)  hours.   ISOSORBIDE MONONITRATE (IMDUR) 30 MG 24 HR TABLET    Take 30 mg by mouth daily.   LEVOFLOXACIN (LEVAQUIN) 500 MG TABLET    Take 1 tablet (500 mg total) by mouth every other day.   LEVOTHYROXINE (SYNTHROID, LEVOTHROID) 25 MCG TABLET    Take 25 mcg by mouth daily.   MEMANTINE (NAMENDA) 5 MG TABLET    Take 5 mg by mouth 2 (two) times daily.   OMEPRAZOLE (PRILOSEC) 20 MG CAPSULE    Take 2 capsules (40 mg total) by mouth 2 (two) times daily.   SUCRALFATE (CARAFATE) 1 GM/10ML SUSPENSION    Take 10 mLs (1 g total) by mouth 4 (four) times daily -  with meals and at bedtime.    TIOTROPIUM (SPIRIVA) 18 MCG INHALATION CAPSULE    Place 18 mcg into inhaler and inhale daily.   TRAMADOL (ULTRAM) 50 MG TABLET    Take 50 mg by mouth every 8 (eight) hours as needed. For pain  Modified Medications   No medications on file  Discontinued Medications   No medications on file     Physical Exam: Physical Exam  Constitutional: She is oriented to person, place, and time. She appears well-developed and well-nourished.  HENT:  Head: Normocephalic and atraumatic.  Eyes: Conjunctivae and EOM are normal. Pupils are equal, round, and reactive to light.  Neck: Normal range of motion. No JVD present. No thyromegaly present.  Chronic decreased lateral ROM of the neck   Cardiovascular: Normal rate, regular rhythm and normal heart sounds.   No murmur heard. Pulmonary/Chest: Effort normal. She has rales.  Bibasilar moist rales and occasional wheezes  Abdominal: Soft. Bowel sounds are normal.  Musculoskeletal: She exhibits edema and tenderness.  Mainly pain in neck and left hip  Lymphadenopathy:    She has no cervical adenopathy.  Neurological: She is alert and oriented to person, place, and time. She has normal reflexes. She displays normal reflexes. No cranial nerve deficit. She exhibits normal muscle tone. Coordination normal.  Skin: Skin is warm and dry. Lesion noted. No rash noted. No erythema.     New skin lesion at bridge of her nose, new, scaly, superficial, non healing--better with Mycolog II cream Bilateral lower legs chronic venous dermatitis.   Psychiatric: Cognition and memory are impaired. She exhibits abnormal recent memory.     Filed Vitals:   10/16/12 1518  BP: 144/78  Pulse: 68  Temp: 96.9 F (36.1 C)  TempSrc: Tympanic  Resp: 16      Labs reviewed: Basic Metabolic Panel:  Recent Labs  29/52/84 1718  11/02/11 0510 11/02/11 1352  05/06/12 0500 05/06/12 1529 05/07/12 0508 07/13/12 09/28/12  NA 141  --  142 139  < > 138 134* 135 141 141  K 4.3   --  3.6 3.4*  < > 3.9 3.8 3.5 4.4 3.8  CL 102  --  106 105  < > 106 101 104  --   --   CO2 28  --  25 26  < > 22 22 23   --   --   GLUCOSE 80  --  112* 81  < > 85 84 87  --   --   BUN 25*  --  23 24*  < > 46* 47* 42* 24* 24*  CREATININE 1.50*  < > 1.10 1.42*  < > 1.46* 1.67* 1.54* 0.9 0.9  CALCIUM 9.5  --  8.6 7.9*  < > 8.5 8.5 8.6  --   --  MG  --   --  2.0 1.9  --   --   --   --   --   --   PHOS  --   --   --   --   --   --  3.3  --   --   --   TSH  --   < >  --   --   --   --   --  4.612*  --  4.21  < > = values in this interval not displayed.  Liver Function Tests:  Recent Labs  11/02/11 0510 05/05/12 0233 05/06/12 1529 07/13/12 09/28/12  AST 19 18  --  19 17  ALT 14 12  --  13 11  ALKPHOS 91 87  --  110 87  BILITOT 0.3 0.3  --   --   --   PROT 5.5* 6.5  --   --   --   ALBUMIN 2.4* 3.3* 2.3*  --   --     CBC:  Recent Labs  11/01/11 1718  05/05/12 0233  05/06/12 0500 05/07/12 0508 07/13/12 09/28/12  WBC 10.3  < > 16.8*  --  12.7* 11.0* 11.7 9.1  NEUTROABS 6.3  --   --   --   --   --   --   --   HGB 14.7  < > 14.8  < > 11.6* 11.7* 11.9* 11.6*  HCT 44.4  < > 43.8  --  34.8* 34.7* 35* 36  MCV 98.2  < > 94.8  --  94.1 94.3  --   --   PLT 276  < > 310  --  252 251 404* 360  < > = values in this interval not displayed.  Anemia Panel: No results found for this basename: IRON, FOLATE, VITAMINB12,  in the last 8760 hours  Significant Diagnostic Results:     Assessment/Plan Chronic pain Chronic left hip pain-worse with weight bearing and movement- Tramadol 50mg  at 2pm and Tylenol 650mg  tid.     GERD (gastroesophageal reflux disease) Hx GI bleed, stable, Hgb11.9 07/14/11, continue  Carafate to 1gm bid, continue Omeprazole 20mg  bid.       Depression Stable on Cymbalta 30mg  bid    Dementia Gradual decline, takes Namenda and lives in [place].       Neuropathy Stable on Gabapentin 100mg  tid and Tylenol 650mg  tid.       Unspecified  hypothyroidism Corrected with Levothyroxine 12.64mcg, last TSH 3.598 05/18/12--4.214 09/28/12      Herpes genitalis in women Taking Acyclovire 200mg  for suppression therapy--no flare up  CAD (coronary artery disease) of artery bypass graft Stable on Imdur 30mg        Unspecified chronic bronchitis Maintained on Spiriva and Advair.      Family/ staff Communication: monitor the patient   Goals of care: SNF   Labs/tests ordered none.

## 2012-10-16 NOTE — Assessment & Plan Note (Signed)
Maintained on Spiriva and Advair.        

## 2012-10-16 NOTE — Assessment & Plan Note (Signed)
Stable on Cymbalta 30mg  bid

## 2012-10-16 NOTE — Assessment & Plan Note (Signed)
Corrected with Levothyroxine 12.53mcg, last TSH 3.598 05/18/12--4.214 09/28/12

## 2012-10-16 NOTE — Assessment & Plan Note (Signed)
Stable on Imdur 30mg                      

## 2012-10-16 NOTE — Assessment & Plan Note (Signed)
Taking Acyclovire 200mg  for suppression therapy--no flare up

## 2012-10-16 NOTE — Assessment & Plan Note (Signed)
Stable on Gabapentin 100mg tid and Tylenol 650mg tid.   

## 2012-12-03 ENCOUNTER — Non-Acute Institutional Stay (SKILLED_NURSING_FACILITY): Payer: Medicare Other | Admitting: Nurse Practitioner

## 2012-12-03 DIAGNOSIS — G8929 Other chronic pain: Secondary | ICD-10-CM

## 2012-12-03 DIAGNOSIS — I25810 Atherosclerosis of coronary artery bypass graft(s) without angina pectoris: Secondary | ICD-10-CM

## 2012-12-03 DIAGNOSIS — G629 Polyneuropathy, unspecified: Secondary | ICD-10-CM

## 2012-12-03 DIAGNOSIS — F329 Major depressive disorder, single episode, unspecified: Secondary | ICD-10-CM

## 2012-12-03 DIAGNOSIS — E039 Hypothyroidism, unspecified: Secondary | ICD-10-CM

## 2012-12-03 DIAGNOSIS — G589 Mononeuropathy, unspecified: Secondary | ICD-10-CM

## 2012-12-03 DIAGNOSIS — K219 Gastro-esophageal reflux disease without esophagitis: Secondary | ICD-10-CM

## 2012-12-03 DIAGNOSIS — J42 Unspecified chronic bronchitis: Secondary | ICD-10-CM

## 2012-12-03 DIAGNOSIS — F0390 Unspecified dementia without behavioral disturbance: Secondary | ICD-10-CM

## 2012-12-03 NOTE — Assessment & Plan Note (Signed)
Hx GI bleed, stable, Hgb11.9 07/14/11, continue  Carafate to 1gm bid, continue Omeprazole 20mg  bid.

## 2012-12-03 NOTE — Assessment & Plan Note (Signed)
Stable on Cymbalta 30mg bid   

## 2012-12-03 NOTE — Assessment & Plan Note (Signed)
Gradual decline, takes Namenda and lives in SNF.  

## 2012-12-03 NOTE — Assessment & Plan Note (Signed)
Corrected with Levothyroxine 12.5mcg, last TSH 3.598 05/18/12--4.214 09/28/12     

## 2012-12-03 NOTE — Progress Notes (Signed)
Patient ID: [name] [name], female   DOB: 03-12-1918, 79 y.o.   MRN: [id number] Code Status: DNR  No Known Allergies  Chief Complaint  Patient presents with  . Medical Managment of Chronic Issues    HPI: Patient is a 79 y.o. female seen in the SNF at [facility] today for evaluation of chronic medical conditions.  Problem List Items Addressed This Visit   CAD (coronary artery disease) of artery bypass graft     Stable on Imdur 30mg            Chronic pain (Chronic)     Chronic left hip pain-worse with weight bearing and movement- Tramadol 50mg  at 2pm and Tylenol 650mg  tid.         Dementia     Gradual decline, takes Namenda and lives in [place].           Depression     Stable on Cymbalta 30mg  bid        GERD (gastroesophageal reflux disease)     Hx GI bleed, stable, Hgb11.9 07/14/11, continue  Carafate to 1gm bid, continue Omeprazole 20mg  bid.           Neuropathy     Stable on Gabapentin 100mg  tid and Tylenol 650mg  tid.           Unspecified chronic bronchitis     Maintained on Spiriva and Advair.       Unspecified hypothyroidism - Primary (Chronic)     Corrected with Levothyroxine 12.53mcg, last TSH 3.598 05/18/12--4.214 09/28/12             Review of Systems:  Review of Systems  Constitutional: Negative for fever, chills, weight loss, malaise/fatigue and diaphoresis.  HENT: Positive for hearing loss and neck pain. Negative for ear pain, congestion and sore throat.   Eyes: Negative for pain, discharge and redness.  Respiratory: Positive for cough and shortness of breath. Negative for sputum production and wheezing.   Cardiovascular: Positive for leg swelling (trace in ankle, chronic, venous insufficiency) and PND. Negative for chest pain, orthopnea and claudication.  Gastrointestinal: Negative for heartburn, nausea, vomiting, abdominal pain, diarrhea, constipation and blood in stool.  Genitourinary: Positive for frequency. Negative for  dysuria, urgency and flank pain.  Musculoskeletal: Positive for myalgias, back pain and joint pain (left hip pain. ). Negative for falls.  Skin: Negative for itching and rash.       Tip of nose scratched marks--better with Mycolog II cream  Neurological: Negative for dizziness, tingling, tremors, speech change, focal weakness, seizures, loss of consciousness and weakness. Sensory change: periphearl neuropathy.  Endo/Heme/Allergies: Negative for environmental allergies and polydipsia. Does not bruise/bleed easily.  Psychiatric/Behavioral: Positive for memory loss. Negative for depression and hallucinations. The patient is not nervous/anxious and does not have insomnia.      Past Medical History  Diagnosis Date  . Hypertension   . COPD (chronic obstructive pulmonary disease)   . CHF (congestive heart failure)   . Coronary artery disease   . Renal disorder   . GERD (gastroesophageal reflux disease)   . Osteoporosis   . Syncope   . Dementia   . Anemia   . Parkinson disease   . Neuropathy   . Thyroid disease   . Hyperlipidemia   . Anxiety   . Depression   . Chronic pain 11/01/2011   Past Surgical History  Procedure Laterality Date  . Fracture surgery    . Esophagogastroduodenoscopy  05/05/2012    Procedure: ESOPHAGOGASTRODUODENOSCOPY (EGD);  Surgeon:  [name], MD;  Location: [facility] ENDOSCOPY;  Service: Endoscopy;  Laterality: N/A;  Pediatric upper endoscope   Social History:   reports that she has never smoked. She does not have any smokeless tobacco history on file. She reports that she does not drink alcohol or use illicit drugs.    Medications: Reviewed at [facility]   Physical Exam: Physical Exam  Constitutional: She is oriented to person, place, and time. She appears well-developed and well-nourished.  HENT:  Head: Normocephalic and atraumatic.  Eyes: Conjunctivae and EOM are normal. Pupils are equal, round, and reactive to light.  Neck: Normal range of motion. No JVD  present. No thyromegaly present.  Chronic decreased lateral ROM of the neck   Cardiovascular: Normal rate, regular rhythm and normal heart sounds.   No murmur heard. Pulmonary/Chest: Effort normal. She has rales.  Bibasilar moist rales and occasional wheezes  Abdominal: Soft. Bowel sounds are normal.  Musculoskeletal: She exhibits edema and tenderness.  Mainly pain in neck and left hip  Lymphadenopathy:    She has no cervical adenopathy.  Neurological: She is alert and oriented to person, place, and time. She has normal reflexes. She displays normal reflexes. No cranial nerve deficit. She exhibits normal muscle tone. Coordination normal.  Skin: Skin is warm and dry. Lesion noted. No rash noted. No erythema.     New skin lesion at bridge of her nose, new, scaly, superficial, non healing--11/17/12 Dermatology consultation: Bowen's disease-R leg. 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   Psychiatric: Cognition and memory are impaired. She exhibits abnormal recent memory.    Filed Vitals:   12/03/12 1324  BP: 166/84  Pulse: 72  Temp: 98.2 F (36.8 C)  TempSrc: Tympanic  Resp: 18      Labs reviewed: Basic Metabolic Panel:  Recent Labs  04/54/09 0500 05/06/12 1529 05/07/12 0508 07/13/12 09/28/12  NA 138 134* 135 141 141  K 3.9 3.8 3.5 4.4 3.8  CL 106 101 104  --   --   CO2 22 22 23   --   --   GLUCOSE 85 84 87  --   --   BUN 46* 47* 42* 24* 24*  CREATININE 1.46* 1.67* 1.54* 0.9 0.9  CALCIUM 8.5 8.5 8.6  --   --   PHOS  --  3.3  --   --   --   TSH  --   --  4.612*  --  4.21   Liver Function Tests:  Recent Labs  05/05/12 0233 05/06/12 1529 07/13/12 09/28/12  AST 18  --  19 17  ALT 12  --  13 11  ALKPHOS 87  --  110 87  BILITOT 0.3  --   --   --   PROT 6.5  --   --   --   ALBUMIN 3.3* 2.3*  --   --     CBC:  Recent Labs  05/05/12 0233  05/06/12 0500 05/07/12 0508 07/13/12 09/28/12  WBC 16.8*  --  12.7* 11.0* 11.7  9.1  HGB 14.8  < > 11.6* 11.7* 11.9* 11.6*  HCT 43.8  --  34.8* 34.7* 35* 36  MCV 94.8  --  94.1 94.3  --   --   PLT 310  --  252 251 404* 360  < > = values in this interval not displayed.      Assessment/Plan Unspecified hypothyroidism Corrected with Levothyroxine 12.67mcg, last TSH 3.598 05/18/12--4.214 09/28/12  CAD (coronary artery disease) of artery bypass graft Stable on Imdur 30mg          Unspecified chronic bronchitis Maintained on Spiriva and Advair.     Dementia Gradual decline, takes Namenda and lives in [place].         Chronic pain Chronic left hip pain-worse with weight bearing and movement- Tramadol 50mg  at 2pm and Tylenol 650mg  tid.       Depression Stable on Cymbalta 30mg  bid      GERD (gastroesophageal reflux disease) Hx GI bleed, stable, Hgb11.9 07/14/11, continue  Carafate to 1gm bid, continue Omeprazole 20mg  bid.         Neuropathy Stable on Gabapentin 100mg  tid and Tylenol 650mg  tid.           Family/ Staff Communication: observe the patient  Goals of Care: SNF  Labs/tests ordered: none

## 2012-12-03 NOTE — Assessment & Plan Note (Signed)
Maintained on Spiriva and Advair.        

## 2012-12-03 NOTE — Assessment & Plan Note (Signed)
Stable on Imdur 30mg                      

## 2012-12-03 NOTE — Assessment & Plan Note (Signed)
Stable on Gabapentin 100mg tid and Tylenol 650mg tid.   

## 2012-12-03 NOTE — Assessment & Plan Note (Signed)
Chronic left hip pain-worse with weight bearing and movement- Tramadol 50mg  at 2pm and Tylenol 650mg  tid.

## 2012-12-15 ENCOUNTER — Non-Acute Institutional Stay (SKILLED_NURSING_FACILITY): Payer: Medicare Other | Admitting: Nurse Practitioner

## 2012-12-15 ENCOUNTER — Encounter: Payer: Self-pay | Admitting: Nurse Practitioner

## 2012-12-15 DIAGNOSIS — F3289 Other specified depressive episodes: Secondary | ICD-10-CM

## 2012-12-15 DIAGNOSIS — G629 Polyneuropathy, unspecified: Secondary | ICD-10-CM

## 2012-12-15 DIAGNOSIS — J42 Unspecified chronic bronchitis: Secondary | ICD-10-CM

## 2012-12-15 DIAGNOSIS — G8929 Other chronic pain: Secondary | ICD-10-CM

## 2012-12-15 DIAGNOSIS — F329 Major depressive disorder, single episode, unspecified: Secondary | ICD-10-CM

## 2012-12-15 DIAGNOSIS — K219 Gastro-esophageal reflux disease without esophagitis: Secondary | ICD-10-CM

## 2012-12-15 DIAGNOSIS — G589 Mononeuropathy, unspecified: Secondary | ICD-10-CM

## 2012-12-15 DIAGNOSIS — E039 Hypothyroidism, unspecified: Secondary | ICD-10-CM

## 2012-12-15 DIAGNOSIS — A6000 Herpesviral infection of urogenital system, unspecified: Secondary | ICD-10-CM

## 2012-12-15 DIAGNOSIS — F0390 Unspecified dementia without behavioral disturbance: Secondary | ICD-10-CM

## 2012-12-15 DIAGNOSIS — A6009 Herpesviral infection of other urogenital tract: Secondary | ICD-10-CM

## 2012-12-15 DIAGNOSIS — I10 Essential (primary) hypertension: Secondary | ICD-10-CM

## 2012-12-15 NOTE — Assessment & Plan Note (Signed)
Stable on Gabapentin 100mg tid and Tylenol 650mg tid.   

## 2012-12-15 NOTE — Assessment & Plan Note (Signed)
Gradual decline, takes Namenda 14mg and lives in SNF.                

## 2012-12-15 NOTE — Assessment & Plan Note (Signed)
Stable on Cymbalta 30mg bid   

## 2012-12-15 NOTE — Assessment & Plan Note (Signed)
Stable on Advair and spiriva   

## 2012-12-15 NOTE — Assessment & Plan Note (Signed)
Corrected with Levothyroxine 12.5mcg, last TSH 3.598 05/18/12--4.214 09/28/12     

## 2012-12-15 NOTE — Assessment & Plan Note (Signed)
Chronic left hip pain-worse with weight bearing and movement- Tramadol 50mg  at 2pm and prn and Tylenol 650mg  tid.

## 2012-12-15 NOTE — Assessment & Plan Note (Signed)
Hx GI bleed, stable, Hgb11.9 07/14/11-11.6 09/29/39, off  Carafate 12/14/12 and  continue Omeprazole 40mg bid.   

## 2012-12-15 NOTE — Progress Notes (Signed)
Patient ID: [name] [name], female   DOB: [birth year], 79 y.o.   MRN: [id number] Code Status: DNR  No Known Allergies  Chief Complaint  Patient presents with  . Medical Managment of Chronic Issues    blood pressure    HPI: Patient is a 79 y.o. female seen in the SNF at [facility] today for evaluation of Bp and other chronic medical conditions.  Problem List Items Addressed This Visit   Chronic pain (Chronic)     Chronic left hip pain-worse with weight bearing and movement- Tramadol 50mg  at 2pm and prn and Tylenol 650mg  tid.           Relevant Medications      acetaminophen (TYLENOL) 325 MG tablet   Dementia     Gradual decline, takes Namenda 14mg  and lives in [place].             Depression     Stable on Cymbalta 30mg  bid          GERD (gastroesophageal reflux disease)     Hx GI bleed, stable, Hgb11.9 07/14/11-11.6 09/29/39, off  Carafate 12/14/12 and  continue Omeprazole 40mg  bid.             Herpes genitalis in women     Takes Acyclovir chronic for suppression therapy.     HTN (hypertension) - Primary     Mild elevated SBP 140-150 and normal DBP 80s. The patient is asymptomatic. Takes Imdur 30mg  for CAD. Will try low dose of Metoprolol 12.5mg  bid and monitor Bp and HR. Bp 200/90 12/09/12-Clonidine 0.1mg  x1 was effective.     Neuropathy     Stable on Gabapentin 100mg  tid and Tylenol 650mg  tid.           Unspecified chronic bronchitis     Stable on Advair and spiriva    Unspecified hypothyroidism (Chronic)     Corrected with Levothyroxine 12.14mcg, last TSH 3.598 05/18/12--4.214 09/28/12               Review of Systems:  Review of Systems  Constitutional: Negative for fever, chills, weight loss, malaise/fatigue and diaphoresis.  HENT: Positive for hearing loss and neck pain. Negative for ear pain, congestion and sore throat.   Eyes: Negative for pain, discharge and redness.  Respiratory: Positive for cough and shortness of breath.  Negative for sputum production and wheezing.   Cardiovascular: Positive for leg swelling (trace in ankle, chronic, venous insufficiency) and PND. Negative for chest pain, orthopnea and claudication.  Gastrointestinal: Negative for heartburn, nausea, vomiting, abdominal pain, diarrhea, constipation and blood in stool.  Genitourinary: Positive for frequency. Negative for dysuria, urgency and flank pain.  Musculoskeletal: Positive for myalgias, back pain and joint pain (left hip pain. ). Negative for falls.  Skin: Negative for itching and rash.       Tip of nose scratched marks--better with Mycolog II cream  Neurological: Negative for dizziness, tingling, tremors, speech change, focal weakness, seizures, loss of consciousness and weakness. Sensory change: periphearl neuropathy.  Endo/Heme/Allergies: Negative for environmental allergies and polydipsia. Does not bruise/bleed easily.  Psychiatric/Behavioral: Positive for memory loss. Negative for depression and hallucinations. The patient is not nervous/anxious and does not have insomnia.      Past Medical History  Diagnosis Date  . Hypertension   . COPD (chronic obstructive pulmonary disease)   . CHF (congestive heart failure)   . Coronary artery disease   . Renal disorder   . GERD (gastroesophageal reflux disease)   . Osteoporosis   .  Syncope   . Dementia   . Anemia   . Parkinson disease   . Neuropathy   . Thyroid disease   . Hyperlipidemia   . Anxiety   . Depression   . Chronic pain 11/01/2011   Past Surgical History  Procedure Laterality Date  . Fracture surgery    . Esophagogastroduodenoscopy  05/05/2012    Procedure: ESOPHAGOGASTRODUODENOSCOPY (EGD);  Surgeon: [name], MD;  Location: [facility] ENDOSCOPY;  Service: Endoscopy;  Laterality: N/A;  Pediatric upper endoscope   Social History:   reports that she has never smoked. She does not have any smokeless tobacco history on file. She reports that she does not drink alcohol or  use illicit drugs.  History reviewed. No pertinent family history.  Medications: Patient's Medications  New Prescriptions   No medications on file  Previous Medications   ACETAMINOPHEN (TYLENOL) 325 MG TABLET    Take 650 mg by mouth 3 (three) times daily.   ACYCLOVIR (ZOVIRAX) 200 MG CAPSULE    Take 200 mg by mouth daily.   CALCIUM-VITAMIN D (OSCAL WITH D) 500-200 MG-UNIT PER TABLET    Take 1 tablet by mouth daily.   DULOXETINE (CYMBALTA) 30 MG CAPSULE    Take 30 mg by mouth 2 (two) times daily.   FLUTICASONE-SALMETEROL (ADVAIR) 100-50 MCG/DOSE AEPB    Inhale 1 puff into the lungs every 12 (twelve) hours.   GABAPENTIN (NEURONTIN) 100 MG CAPSULE    Take 100 mg by mouth 3 (three) times daily.   HYDROXYPROPYL METHYLCELLULOSE (ISOPTO TEARS) 2.5 % OPHTHALMIC SOLUTION    Place 1 drop into both eyes every 4 (four) hours.   ISOSORBIDE MONONITRATE (IMDUR) 30 MG 24 HR TABLET    Take 30 mg by mouth daily.   LEVOTHYROXINE (SYNTHROID, LEVOTHROID) 25 MCG TABLET    Take 12.5 mcg by mouth daily.    MEMANTINE (NAMENDA) 5 MG TABLET    Take 14 mg by mouth daily.    OMEPRAZOLE (PRILOSEC) 20 MG CAPSULE    Take 2 capsules (40 mg total) by mouth 2 (two) times daily.   TIOTROPIUM (SPIRIVA) 18 MCG INHALATION CAPSULE    Place 18 mcg into inhaler and inhale daily.   TRAMADOL (ULTRAM) 50 MG TABLET    Take 50 mg by mouth every 8 (eight) hours as needed. For pain  Modified Medications   No medications on file  Discontinued Medications   FLUTICASONE-SALMETEROL (ADVAIR) 100-50 MCG/DOSE AEPB    Inhale 1 puff into the lungs every 12 (twelve) hours.   LEVOFLOXACIN (LEVAQUIN) 500 MG TABLET    Take 1 tablet (500 mg total) by mouth every other day.   SUCRALFATE (CARAFATE) 1 GM/10ML SUSPENSION    Take 10 mLs (1 g total) by mouth 4 (four) times daily -  with meals and at bedtime.     Physical Exam: Physical Exam  Constitutional: She is oriented to person, place, and time. She appears well-developed and well-nourished.    HENT:  Head: Normocephalic and atraumatic.  Eyes: Conjunctivae and EOM are normal. Pupils are equal, round, and reactive to light.  Neck: Normal range of motion. No JVD present. No thyromegaly present.  Chronic decreased lateral ROM of the neck   Cardiovascular: Normal rate, regular rhythm and normal heart sounds.   No murmur heard. Pulmonary/Chest: Effort normal. She has rales.  Bibasilar moist rales and occasional wheezes  Abdominal: Soft. Bowel sounds are normal.  Musculoskeletal: She exhibits edema and tenderness.  Mainly pain in neck and left hip  Lymphadenopathy:    She has no cervical adenopathy.  Neurological: She is alert and oriented to person, place, and time. She has normal reflexes. She displays normal reflexes. No cranial nerve deficit. She exhibits normal muscle tone. Coordination normal.  Skin: Skin is warm and dry. Lesion noted. No rash noted. No erythema.     New skin lesion at bridge of her nose, new, scaly, superficial, non healing--11/17/12 Dermatology consultation: Bowen's disease-R leg. 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   Psychiatric: Cognition and memory are impaired. She exhibits abnormal recent memory.    Filed Vitals:   12/15/12 1211  BP: 148/86  Pulse: 74  Temp: 98.7 F (37.1 C)  TempSrc: Tympanic  Resp: 12      Labs reviewed: Basic Metabolic Panel:  Recent Labs  36/64/40 0500 05/06/12 1529 05/07/12 0508 07/13/12 09/28/12  NA 138 134* 135 141 141  K 3.9 3.8 3.5 4.4 3.8  CL 106 101 104  --   --   CO2 22 22 23   --   --   GLUCOSE 85 84 87  --   --   BUN 46* 47* 42* 24* 24*  CREATININE 1.46* 1.67* 1.54* 0.9 0.9  CALCIUM 8.5 8.5 8.6  --   --   PHOS  --  3.3  --   --   --   TSH  --   --  4.612*  --  4.21   Liver Function Tests:  Recent Labs  05/05/12 0233 05/06/12 1529 07/13/12 09/28/12  AST 18  --  19 17  ALT 12  --  13 11  ALKPHOS 87  --  110 87  BILITOT 0.3  --   --   --   PROT 6.5   --   --   --   ALBUMIN 3.3* 2.3*  --   --    No results found for this basename: LIPASE, AMYLASE,  in the last 8760 hours No results found for this basename: AMMONIA,  in the last 8760 hours CBC:  Recent Labs  05/05/12 0233  05/06/12 0500 05/07/12 0508 07/13/12 09/28/12  WBC 16.8*  --  12.7* 11.0* 11.7 9.1  HGB 14.8  < > 11.6* 11.7* 11.9* 11.6*  HCT 43.8  --  34.8* 34.7* 35* 36  MCV 94.8  --  94.1 94.3  --   --   PLT 310  --  252 251 404* 360  < > = values in this interval not displayed. Lipid Panel: No results found for this basename: CHOL, HDL, LDLCALC, TRIG, CHOLHDL, LDLDIRECT,  in the last 8760 hours Anemia Panel: No results found for this basename: FOLATE, IRON, VITAMINB12,  in the last 8760 hours  Past Procedures:     Assessment/Plan HTN (hypertension) Mild elevated SBP 140-150 and normal DBP 80s. The patient is asymptomatic. Takes Imdur 30mg  for CAD. Will try low dose of Metoprolol 12.5mg  bid and monitor Bp and HR. Bp 200/90 12/09/12-Clonidine 0.1mg  x1 was effective.   Depression Stable on Cymbalta 30mg  bid        Unspecified chronic bronchitis Stable on Advair and spiriva  Chronic pain Chronic left hip pain-worse with weight bearing and movement- Tramadol 50mg  at 2pm and prn and Tylenol 650mg  tid.         Dementia Gradual decline, takes Namenda 14mg  and lives in [place].           GERD (gastroesophageal reflux disease) Hx GI bleed, stable, Hgb11.9 07/14/11-11.6 09/29/39, off  Carafate 12/14/12 and  continue Omeprazole 40mg  bid.           Neuropathy Stable on Gabapentin 100mg  tid and Tylenol 650mg  tid.         Unspecified hypothyroidism Corrected with Levothyroxine 12.75mcg, last TSH 3.598 05/18/12--4.214 09/28/12          Herpes genitalis in women Takes Acyclovir chronic for suppression therapy.     Family/ Staff Communication: observe the patient and monitor Bp  Goals of Care: SNF  Labs/tests ordered: none.

## 2012-12-15 NOTE — Assessment & Plan Note (Signed)
Takes Acyclovir chronic for suppression therapy.     

## 2012-12-15 NOTE — Assessment & Plan Note (Addendum)
Mild elevated SBP 140-150 and normal DBP 80s. The patient is asymptomatic. Takes Imdur 30mg  for CAD. Will try low dose of Metoprolol 12.5mg  bid and monitor Bp and HR. Bp 200/90 12/09/12-Clonidine 0.1mg  x1 was effective.

## 2012-12-24 ENCOUNTER — Encounter: Payer: Self-pay | Admitting: Internal Medicine

## 2012-12-25 ENCOUNTER — Encounter: Payer: Self-pay | Admitting: Nurse Practitioner

## 2012-12-25 ENCOUNTER — Non-Acute Institutional Stay (SKILLED_NURSING_FACILITY): Payer: Medicare Other | Admitting: Nurse Practitioner

## 2012-12-25 DIAGNOSIS — G629 Polyneuropathy, unspecified: Secondary | ICD-10-CM

## 2012-12-25 DIAGNOSIS — F0390 Unspecified dementia without behavioral disturbance: Secondary | ICD-10-CM

## 2012-12-25 DIAGNOSIS — M25559 Pain in unspecified hip: Secondary | ICD-10-CM

## 2012-12-25 DIAGNOSIS — M25552 Pain in left hip: Secondary | ICD-10-CM | POA: Insufficient documentation

## 2012-12-25 DIAGNOSIS — F3289 Other specified depressive episodes: Secondary | ICD-10-CM

## 2012-12-25 DIAGNOSIS — G589 Mononeuropathy, unspecified: Secondary | ICD-10-CM

## 2012-12-25 DIAGNOSIS — F329 Major depressive disorder, single episode, unspecified: Secondary | ICD-10-CM

## 2012-12-25 DIAGNOSIS — I10 Essential (primary) hypertension: Secondary | ICD-10-CM

## 2012-12-25 DIAGNOSIS — I25810 Atherosclerosis of coronary artery bypass graft(s) without angina pectoris: Secondary | ICD-10-CM

## 2012-12-25 NOTE — Assessment & Plan Note (Signed)
Stable on Cymbalta 30mg bid   

## 2012-12-25 NOTE — Assessment & Plan Note (Addendum)
Chronic since the left hip fx surgical repair 06/2010-will increase Tramadol to 50mg  q6hr in addition to Tylenol 650mg  tid  for pain control. W/c for mobility when out of bed.

## 2012-12-25 NOTE — Assessment & Plan Note (Signed)
Gradual decline, takes Namenda 14mg and lives in SNF.                

## 2012-12-25 NOTE — Assessment & Plan Note (Signed)
Stable on Imdur 30mg                      

## 2012-12-25 NOTE — Progress Notes (Signed)
Patient ID: [name] [name], female   DOB: Apr 11, 1918, 79 y.o.   MRN: [id number] Code Status: DNR  No Known Allergies  Chief Complaint  Patient presents with  . Medical Managment of Chronic Issues    uncontrolled left hip pain.     HPI: Patient is a 79 y.o. female seen in the SNF at [facility] today for evaluation of the left hip pain. and other chronic medical conditions.  Problem List Items Addressed This Visit   CAD (coronary artery disease) of artery bypass graft     Stable on Imdur 30mg              Dementia     Gradual decline, takes Namenda 14mg  and lives in [place].               Depression     Stable on Cymbalta 30mg  bid            HTN (hypertension)     Mild elevated SBP 140-150 and normal DBP 80s. The patient is asymptomatic. Takes Imdur 30mg  for CAD. Better with Metoprolol 12.5mg  bid and monitor Bp and HR.       Neuropathy     Stable on Gabapentin 100mg  tid and Tylenol 650mg  tid.             Pain in left hip - Primary     Chronic since the left hip fx surgical repair 06/2010-will increase Tramadol to 50mg  q6hr in addition to Tylenol 650mg  tid  for pain control. W/c for mobility when out of bed.       Review of Systems:  Review of Systems  Constitutional: Negative for fever, chills, weight loss, malaise/fatigue and diaphoresis.  HENT: Positive for hearing loss and neck pain. Negative for ear pain, congestion and sore throat.   Eyes: Negative for pain, discharge and redness.  Respiratory: Positive for cough and shortness of breath. Negative for sputum production and wheezing.   Cardiovascular: Positive for leg swelling (trace in ankle, chronic, venous insufficiency) and PND. Negative for chest pain, orthopnea and claudication.  Gastrointestinal: Negative for heartburn, nausea, vomiting, abdominal pain, diarrhea, constipation and blood in stool.  Genitourinary: Positive for frequency. Negative for dysuria, urgency and flank pain.    Musculoskeletal: Positive for myalgias, back pain and joint pain (left hip pain. ). Negative for falls.  Skin: Negative for itching and rash.       Tip of nose scratched marks--better with Mycolog II cream  Neurological: Negative for dizziness, tingling, tremors, speech change, focal weakness, seizures, loss of consciousness and weakness. Sensory change: periphearl neuropathy.  Endo/Heme/Allergies: Negative for environmental allergies and polydipsia. Does not bruise/bleed easily.  Psychiatric/Behavioral: Positive for memory loss. Negative for depression and hallucinations. The patient is not nervous/anxious and does not have insomnia.      Past Medical History  Diagnosis Date  . Hypertension   . COPD (chronic obstructive pulmonary disease)   . CHF (congestive heart failure)   . Coronary artery disease   . Renal disorder   . GERD (gastroesophageal reflux disease)   . Osteoporosis   . Syncope   . Dementia   . Anemia   . Parkinson disease   . Neuropathy   . Thyroid disease   . Hyperlipidemia   . Anxiety   . Depression   . Chronic pain 11/01/2011   Past Surgical History  Procedure Laterality Date  . Fracture surgery    . Esophagogastroduodenoscopy  05/05/2012    Procedure: ESOPHAGOGASTRODUODENOSCOPY (EGD);  Surgeon:  [name], MD;  Location: [facility] ENDOSCOPY;  Service: Endoscopy;  Laterality: N/A;  Pediatric upper endoscope   Social History:   reports that she has never smoked. She does not have any smokeless tobacco history on file. She reports that she does not drink alcohol or use illicit drugs.    Medications: Reviewed at [facility]   Physical Exam: Physical Exam  Constitutional: She is oriented to person, place, and time. She appears well-developed and well-nourished.  HENT:  Head: Normocephalic and atraumatic.  Eyes: Conjunctivae and EOM are normal. Pupils are equal, round, and reactive to light.  Neck: Normal range of motion. No JVD present. No thyromegaly present.   Chronic decreased lateral ROM of the neck   Cardiovascular: Normal rate, regular rhythm and normal heart sounds.   No murmur heard. Pulmonary/Chest: Effort normal. She has rales.  Bibasilar moist rales and occasional wheezes  Abdominal: Soft. Bowel sounds are normal.  Musculoskeletal: She exhibits edema and tenderness.  Mainly pain in neck and left hip  Lymphadenopathy:    She has no cervical adenopathy.  Neurological: She is alert and oriented to person, place, and time. She has normal reflexes. No cranial nerve deficit. She exhibits normal muscle tone. Coordination normal.  Skin: Skin is warm and dry. No lesion and no rash noted. No erythema.     New skin lesion at bridge of her nose, new, scaly, superficial, non healing--11/17/12 Dermatology consultation: Bowen's disease-R leg. 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   Psychiatric: Cognition and memory are impaired. She exhibits abnormal recent memory.    Filed Vitals:   12/25/12 1522  BP: 146/82  Pulse: 76  Temp: 98.4 F (36.9 C)  TempSrc: Tympanic  Resp: 20      Labs reviewed: Basic Metabolic Panel:  Recent Labs  16/10/96 0500 05/06/12 1529 05/07/12 0508 07/13/12 09/28/12  NA 138 134* 135 141 141  K 3.9 3.8 3.5 4.4 3.8  CL 106 101 104  --   --   CO2 22 22 23   --   --   GLUCOSE 85 84 87  --   --   BUN 46* 47* 42* 24* 24*  CREATININE 1.46* 1.67* 1.54* 0.9 0.9  CALCIUM 8.5 8.5 8.6  --   --   PHOS  --  3.3  --   --   --   TSH  --   --  4.612*  --  4.21   Liver Function Tests:  Recent Labs  05/05/12 0233 05/06/12 1529 07/13/12 09/28/12  AST 18  --  19 17  ALT 12  --  13 11  ALKPHOS 87  --  110 87  BILITOT 0.3  --   --   --   PROT 6.5  --   --   --   ALBUMIN 3.3* 2.3*  --   --     CBC:  Recent Labs  05/05/12 0233  05/06/12 0500 05/07/12 0508 07/13/12 09/28/12  WBC 16.8*  --  12.7* 11.0* 11.7 9.1  HGB 14.8  < > 11.6* 11.7* 11.9* 11.6*  HCT 43.8  --  34.8*  34.7* 35* 36  MCV 94.8  --  94.1 94.3  --   --   PLT 310  --  252 251 404* 360  < > = values in this interval not displayed.  Assessment/Plan Pain in left hip Chronic since the left hip fx surgical repair 06/2010-will increase Tramadol to 50mg  q6hr in addition to  Tylenol 650mg  tid  for pain control. W/c for mobility when out of bed.  HTN (hypertension) Mild elevated SBP 140-150 and normal DBP 80s. The patient is asymptomatic. Takes Imdur 30mg  for CAD. Better with Metoprolol 12.5mg  bid and monitor Bp and HR.     CAD (coronary artery disease) of artery bypass graft Stable on Imdur 30mg            Depression Stable on Cymbalta 30mg  bid          Dementia Gradual decline, takes Namenda 14mg  and lives in [place].             Neuropathy Stable on Gabapentin 100mg  tid and Tylenol 650mg  tid.             Family/ Staff Communication: observe the patient   Goals of Care: SNF  Labs/tests ordered: none.

## 2012-12-25 NOTE — Assessment & Plan Note (Signed)
Stable on Gabapentin 100mg tid and Tylenol 650mg tid.   

## 2012-12-25 NOTE — Assessment & Plan Note (Signed)
Mild elevated SBP 140-150 and normal DBP 80s. The patient is asymptomatic. Takes Imdur 30mg  for CAD. Better with Metoprolol 12.5mg  bid and monitor Bp and HR.

## 2013-01-05 ENCOUNTER — Non-Acute Institutional Stay (SKILLED_NURSING_FACILITY): Payer: Medicare Other | Admitting: Nurse Practitioner

## 2013-01-05 ENCOUNTER — Encounter: Payer: Self-pay | Admitting: Nurse Practitioner

## 2013-01-05 DIAGNOSIS — I25810 Atherosclerosis of coronary artery bypass graft(s) without angina pectoris: Secondary | ICD-10-CM

## 2013-01-05 DIAGNOSIS — G629 Polyneuropathy, unspecified: Secondary | ICD-10-CM

## 2013-01-05 DIAGNOSIS — F0390 Unspecified dementia without behavioral disturbance: Secondary | ICD-10-CM

## 2013-01-05 DIAGNOSIS — G589 Mononeuropathy, unspecified: Secondary | ICD-10-CM

## 2013-01-05 DIAGNOSIS — E039 Hypothyroidism, unspecified: Secondary | ICD-10-CM

## 2013-01-05 DIAGNOSIS — K219 Gastro-esophageal reflux disease without esophagitis: Secondary | ICD-10-CM

## 2013-01-05 DIAGNOSIS — I10 Essential (primary) hypertension: Secondary | ICD-10-CM

## 2013-01-05 DIAGNOSIS — M25552 Pain in left hip: Secondary | ICD-10-CM

## 2013-01-05 DIAGNOSIS — M25559 Pain in unspecified hip: Secondary | ICD-10-CM

## 2013-01-05 DIAGNOSIS — F329 Major depressive disorder, single episode, unspecified: Secondary | ICD-10-CM

## 2013-01-05 NOTE — Assessment & Plan Note (Signed)
Takes Imdur 30mg  for CAD. Takes Metoprolol 12.5mg  bid-Bps elevated in am--will adjust Metoprolol to 25mg  in am to help elevated Bp in am and continue 12mg  in am since most Bp reasonably controlled in pm. Monitor Bp

## 2013-01-05 NOTE — Assessment & Plan Note (Signed)
Hx GI bleed, stable, Hgb11.9 07/14/11-11.6 09/29/39, off  Carafate 12/14/12 and  continue Omeprazole 40mg bid.   

## 2013-01-05 NOTE — Assessment & Plan Note (Signed)
Stable on Imdur 30mg                      

## 2013-01-05 NOTE — Assessment & Plan Note (Signed)
Stable on Gabapentin 100mg tid and Tylenol 650mg tid.   

## 2013-01-05 NOTE — Assessment & Plan Note (Signed)
Corrected with Levothyroxine 12.5mcg, last TSH 3.598 05/18/12--4.214 09/28/12     

## 2013-01-05 NOTE — Progress Notes (Signed)
Patient ID: [name] [name], female   DOB: 1917/10/31, 79 y.o.   MRN: [id number] Code Status: DNR  No Known Allergies  Chief Complaint  Patient presents with  . Medical Managment of Chronic Issues    elevated blood pressure    HPI: Patient is a 79 y.o. female seen in the SNF at [facility] today for evaluation of blood pressure, the left hip pain, and other chronic medical conditions.  Problem List Items Addressed This Visit   CAD (coronary artery disease) of artery bypass graft     Stable on Imdur 30mg                Dementia     Gradual decline, takes Namenda 14mg  and lives in [place].                 Depression     Stable on Cymbalta 30mg  bid              GERD (gastroesophageal reflux disease)     Hx GI bleed, stable, Hgb11.9 07/14/11-11.6 09/29/39, off  Carafate 12/14/12 and  continue Omeprazole 40mg  bid.               HTN (hypertension) - Primary      Takes Imdur 30mg  for CAD. Takes Metoprolol 12.5mg  bid-Bps elevated in am--will adjust Metoprolol to 25mg  in am to help elevated Bp in am and continue 12mg  in am since most Bp reasonably controlled in pm. Monitor Bp       Neuropathy     Stable on Gabapentin 100mg  tid and Tylenol 650mg  tid.               Pain in left hip     Chronic since the left hip fx surgical repair 06/2010-will increase Tramadol to 50mg  tid and 100mg  daily  in addition to Tylenol 650mg  tid  for pain control. W/c for mobility when out of bed.      Unspecified hypothyroidism (Chronic)     Corrected with Levothyroxine 12.27mcg, last TSH 3.598 05/18/12--4.214 09/28/12               Review of Systems:  Review of Systems  Constitutional: Negative for fever, chills, weight loss, malaise/fatigue and diaphoresis.  HENT: Positive for hearing loss and neck pain. Negative for ear pain, congestion and sore throat.   Eyes: Negative for pain, discharge and redness.  Respiratory: Positive for cough and shortness of  breath. Negative for sputum production and wheezing.   Cardiovascular: Positive for leg swelling (trace in ankle, chronic, venous insufficiency) and PND. Negative for chest pain, orthopnea and claudication.  Gastrointestinal: Negative for heartburn, nausea, vomiting, abdominal pain, diarrhea, constipation and blood in stool.  Genitourinary: Positive for frequency. Negative for dysuria, urgency and flank pain.  Musculoskeletal: Positive for myalgias, back pain and joint pain (left hip pain. ). Negative for falls.  Skin: Negative for itching and rash.       Tip of nose scratched marks--better with Mycolog II cream  Neurological: Negative for dizziness, tingling, tremors, speech change, focal weakness, seizures, loss of consciousness and weakness. Sensory change: periphearl neuropathy.  Endo/Heme/Allergies: Negative for environmental allergies and polydipsia. Does not bruise/bleed easily.  Psychiatric/Behavioral: Positive for memory loss. Negative for depression and hallucinations. The patient is not nervous/anxious and does not have insomnia.      Past Medical History  Diagnosis Date  . Hypertension   . COPD (chronic obstructive pulmonary disease)   . CHF (congestive heart failure)   .  Coronary artery disease   . Renal disorder   . GERD (gastroesophageal reflux disease)   . Osteoporosis   . Syncope   . Dementia   . Anemia   . Parkinson disease   . Neuropathy   . Thyroid disease   . Hyperlipidemia   . Anxiety   . Depression   . Chronic pain 11/01/2011   Past Surgical History  Procedure Laterality Date  . Fracture surgery    . Esophagogastroduodenoscopy  05/05/2012    Procedure: ESOPHAGOGASTRODUODENOSCOPY (EGD);  Surgeon: [name], MD;  Location: [facility] ENDOSCOPY;  Service: Endoscopy;  Laterality: N/A;  Pediatric upper endoscope   Social History:   reports that she has never smoked. She does not have any smokeless tobacco history on file. She reports that she does not drink  alcohol or use illicit drugs.    Medications: Reviewed at [facility]   Physical Exam: Physical Exam  Constitutional: She is oriented to person, place, and time. She appears well-developed and well-nourished.  HENT:  Head: Normocephalic and atraumatic.  Eyes: Conjunctivae and EOM are normal. Pupils are equal, round, and reactive to light.  Neck: Normal range of motion. No JVD present. No thyromegaly present.  Chronic decreased lateral ROM of the neck   Cardiovascular: Normal rate, regular rhythm and normal heart sounds.   No murmur heard. Pulmonary/Chest: Effort normal. She has rales.  Bibasilar moist rales and occasional wheezes  Abdominal: Soft. Bowel sounds are normal.  Musculoskeletal: She exhibits edema and tenderness.  Mainly pain in neck and left hip  Lymphadenopathy:    She has no cervical adenopathy.  Neurological: She is alert and oriented to person, place, and time. She has normal reflexes. No cranial nerve deficit. She exhibits normal muscle tone. Coordination normal.  Skin: Skin is warm and dry. No lesion and no rash noted. No erythema.  New skin lesion at bridge of her nose, new, scaly, superficial, non healing--11/17/12 Dermatology consultation: Bowen's disease-R leg. 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   Psychiatric: Cognition and memory are impaired. She exhibits abnormal recent memory.    Filed Vitals:   01/05/13 1714  BP: 152/78  Pulse: 56  Temp: 97.6 F (36.4 C)  TempSrc: Tympanic  Resp: 16      Labs reviewed: Basic Metabolic Panel:  Recent Labs  16/10/96 0500 05/06/12 1529 05/07/12 0508 07/13/12 09/28/12  NA 138 134* 135 141 141  K 3.9 3.8 3.5 4.4 3.8  CL 106 101 104  --   --   CO2 22 22 23   --   --   GLUCOSE 85 84 87  --   --   BUN 46* 47* 42* 24* 24*  CREATININE 1.46* 1.67* 1.54* 0.9 0.9  CALCIUM 8.5 8.5 8.6  --   --   PHOS  --  3.3  --   --   --   TSH  --   --  4.612*  --  4.21   Liver Function  Tests:  Recent Labs  05/05/12 0233 05/06/12 1529 07/13/12 09/28/12  AST 18  --  19 17  ALT 12  --  13 11  ALKPHOS 87  --  110 87  BILITOT 0.3  --   --   --   PROT 6.5  --   --   --   ALBUMIN 3.3* 2.3*  --   --     CBC:  Recent Labs  05/05/12 0233  05/06/12 0500 05/07/12 0508 07/13/12  09/28/12  WBC 16.8*  --  12.7* 11.0* 11.7 9.1  HGB 14.8  < > 11.6* 11.7* 11.9* 11.6*  HCT 43.8  --  34.8* 34.7* 35* 36  MCV 94.8  --  94.1 94.3  --   --   PLT 310  --  252 251 404* 360  < > = values in this interval not displayed.  Assessment/Plan HTN (hypertension)  Takes Imdur 30mg  for CAD. Takes Metoprolol 12.5mg  bid-Bps elevated in am--will adjust Metoprolol to 25mg  in am to help elevated Bp in am and continue 12mg  in am since most Bp reasonably controlled in pm. Monitor Bp     Unspecified hypothyroidism Corrected with Levothyroxine 12.65mcg, last TSH 3.598 05/18/12--4.214 09/28/12          GERD (gastroesophageal reflux disease) Hx GI bleed, stable, Hgb11.9 07/14/11-11.6 09/29/39, off  Carafate 12/14/12 and  continue Omeprazole 40mg  bid.             Dementia Gradual decline, takes Namenda 14mg  and lives in [place].               Neuropathy Stable on Gabapentin 100mg  tid and Tylenol 650mg  tid.             Depression Stable on Cymbalta 30mg  bid            CAD (coronary artery disease) of artery bypass graft Stable on Imdur 30mg              Pain in left hip Chronic since the left hip fx surgical repair 06/2010-will increase Tramadol to 50mg  tid and 100mg  daily  in addition to Tylenol 650mg  tid  for pain control. W/c for mobility when out of bed.      Family/ Staff Communication: observe the patient   Goals of Care: SNF  Labs/tests ordered: none.

## 2013-01-05 NOTE — Assessment & Plan Note (Signed)
Chronic since the left hip fx surgical repair 06/2010-will increase Tramadol to 50mg  tid and 100mg  daily  in addition to Tylenol 650mg  tid  for pain control. W/c for mobility when out of bed.

## 2013-01-05 NOTE — Assessment & Plan Note (Signed)
Stable on Cymbalta 30mg  bid

## 2013-01-05 NOTE — Assessment & Plan Note (Signed)
Gradual decline, takes Namenda 14mg and lives in SNF.                

## 2013-01-22 ENCOUNTER — Encounter: Payer: Self-pay | Admitting: Nurse Practitioner

## 2013-01-22 ENCOUNTER — Non-Acute Institutional Stay (SKILLED_NURSING_FACILITY): Payer: Medicare Other | Admitting: Nurse Practitioner

## 2013-01-22 DIAGNOSIS — M25552 Pain in left hip: Secondary | ICD-10-CM

## 2013-01-22 DIAGNOSIS — M25559 Pain in unspecified hip: Secondary | ICD-10-CM

## 2013-01-22 DIAGNOSIS — F32A Depression, unspecified: Secondary | ICD-10-CM

## 2013-01-22 DIAGNOSIS — E039 Hypothyroidism, unspecified: Secondary | ICD-10-CM

## 2013-01-22 DIAGNOSIS — F329 Major depressive disorder, single episode, unspecified: Secondary | ICD-10-CM

## 2013-01-22 DIAGNOSIS — G629 Polyneuropathy, unspecified: Secondary | ICD-10-CM

## 2013-01-22 DIAGNOSIS — I25810 Atherosclerosis of coronary artery bypass graft(s) without angina pectoris: Secondary | ICD-10-CM

## 2013-01-22 DIAGNOSIS — I10 Essential (primary) hypertension: Secondary | ICD-10-CM

## 2013-01-22 DIAGNOSIS — F0390 Unspecified dementia without behavioral disturbance: Secondary | ICD-10-CM

## 2013-01-22 DIAGNOSIS — G589 Mononeuropathy, unspecified: Secondary | ICD-10-CM

## 2013-01-22 NOTE — Assessment & Plan Note (Signed)
Stable on Imdur 30mg                      

## 2013-01-22 NOTE — Assessment & Plan Note (Signed)
Corrected with Levothyroxine 12.5mcg, last TSH 3.598 05/18/12--4.214 09/28/12     

## 2013-01-22 NOTE — Assessment & Plan Note (Signed)
Stable on Cymbalta 30mg bid   

## 2013-01-22 NOTE — Assessment & Plan Note (Signed)
Stable on Gabapentin 100mg tid and Tylenol 650mg tid.   

## 2013-01-22 NOTE — Assessment & Plan Note (Signed)
Takes Imdur 30mg for CAD. Takes Metoprolol 12.5mg bid-Bps elevated in am--will adjust Metoprolol to 25mg in am to help elevated Bp in am and continue 12mg in am since most Bp reasonably controlled in pm. Monitor Bp    

## 2013-01-22 NOTE — Assessment & Plan Note (Signed)
Chronic since the left hip fx surgical repair 06/2010-increased Tramadol to 50mg  tid and 100mg  daily  in addition to Tylenol 650mg  tid  for pain control. W/c for mobility when out of bed. X-ray 01/13/13 showed no new acute fracture, subluxation, or dislocation. Also showed old healed fracture deformity at the left femoral head, neck, and intertrochanteric region with orthopedic rod, plate and screws spanning form superolateral to the femoral head to the proximal shaft. Potion of the orthopedic screw appear outside the confines of the femoral head. Consider Orthopedic consultation.

## 2013-01-22 NOTE — Assessment & Plan Note (Signed)
Gradual decline, takes Namenda 14mg  and lives in [place].

## 2013-01-22 NOTE — Progress Notes (Signed)
Patient ID: [name] [name], female   DOB: 09/26/1917, 79 y.o.   MRN: [id number] Code Status: DNR  No Known Allergies  Chief Complaint  Patient presents with  . Medical Managment of Chronic Issues    left hip pain.     HPI: Patient is a 79 y.o. female seen in the SNF at [facility] today for evaluation of  the left hip pain and other chronic medical conditions.  Problem List Items Addressed This Visit   CAD (coronary artery disease) of artery bypass graft     Stable on Imdur 30mg                  Dementia     Gradual decline, takes Namenda 14mg  and lives in [place].                   Depression     Stable on Cymbalta 30mg  bid              HTN (hypertension)      Takes Imdur 30mg  for CAD. Takes Metoprolol 12.5mg  bid-Bps elevated in am--will adjust Metoprolol to 25mg  in am to help elevated Bp in am and continue 12mg  in am since most Bp reasonably controlled in pm. Monitor Bp         Neuropathy     Stable on Gabapentin 100mg  tid and Tylenol 650mg  tid.                 Pain in left hip - Primary     Chronic since the left hip fx surgical repair 06/2010-increased Tramadol to 50mg  tid and 100mg  daily  in addition to Tylenol 650mg  tid  for pain control. W/c for mobility when out of bed. X-ray 01/13/13 showed no new acute fracture, subluxation, or dislocation. Also showed old healed fracture deformity at the left femoral head, neck, and intertrochanteric region with orthopedic rod, plate and screws spanning form superolateral to the femoral head to the proximal shaft. Potion of the orthopedic screw appear outside the confines of the femoral head. Consider Orthopedic consultation.         Unspecified hypothyroidism (Chronic)     Corrected with Levothyroxine 12.10mcg, last TSH 3.598 05/18/12--4.214 09/28/12                 Review of Systems:  Review of Systems  Constitutional: Negative for fever, chills, weight loss, malaise/fatigue  and diaphoresis.  HENT: Positive for hearing loss and neck pain. Negative for ear pain, congestion and sore throat.   Eyes: Negative for pain, discharge and redness.  Respiratory: Positive for cough and shortness of breath. Negative for sputum production and wheezing.   Cardiovascular: Positive for leg swelling (trace in ankle, chronic, venous insufficiency) and PND. Negative for chest pain, orthopnea and claudication.  Gastrointestinal: Negative for heartburn, nausea, vomiting, abdominal pain, diarrhea, constipation and blood in stool.  Genitourinary: Positive for frequency. Negative for dysuria, urgency and flank pain.  Musculoskeletal: Positive for myalgias, back pain and joint pain (left hip pain. ). Negative for falls.  Skin: Negative for itching and rash.       Tip of nose scratched marks--better with Mycolog II cream  Neurological: Negative for dizziness, tingling, tremors, speech change, focal weakness, seizures, loss of consciousness and weakness. Sensory change: periphearl neuropathy.  Endo/Heme/Allergies: Negative for environmental allergies and polydipsia. Does not bruise/bleed easily.  Psychiatric/Behavioral: Positive for memory loss. Negative for depression and hallucinations. The patient is not nervous/anxious and does not have insomnia.  Past Medical History  Diagnosis Date  . Hypertension   . COPD (chronic obstructive pulmonary disease)   . CHF (congestive heart failure)   . Coronary artery disease   . Renal disorder   . GERD (gastroesophageal reflux disease)   . Osteoporosis   . Syncope   . Dementia   . Anemia   . Parkinson disease   . Neuropathy   . Thyroid disease   . Hyperlipidemia   . Anxiety   . Depression   . Chronic pain 11/01/2011   Past Surgical History  Procedure Laterality Date  . Fracture surgery    . Esophagogastroduodenoscopy  05/05/2012    Procedure: ESOPHAGOGASTRODUODENOSCOPY (EGD);  Surgeon: [name], MD;  Location: [facility] ENDOSCOPY;   Service: Endoscopy;  Laterality: N/A;  Pediatric upper endoscope   Social History:   reports that she has never smoked. She does not have any smokeless tobacco history on file. She reports that she does not drink alcohol or use illicit drugs.  Medications: Reviewed at [facility]  Physical Exam: Physical Exam  Constitutional: She is oriented to person, place, and time. She appears well-developed and well-nourished.  HENT:  Head: Normocephalic and atraumatic.  Eyes: Conjunctivae and EOM are normal. Pupils are equal, round, and reactive to light.  Neck: Normal range of motion. No JVD present. No thyromegaly present.  Chronic decreased lateral ROM of the neck   Cardiovascular: Normal rate, regular rhythm and normal heart sounds.   No murmur heard. Pulmonary/Chest: Effort normal. She has rales.  Bibasilar moist rales and occasional wheezes  Abdominal: Soft. Bowel sounds are normal.  Musculoskeletal: She exhibits edema and tenderness.  Mainly pain in neck and left hip  Lymphadenopathy:    She has no cervical adenopathy.  Neurological: She is alert and oriented to person, place, and time. She has normal reflexes. No cranial nerve deficit. She exhibits normal muscle tone. Coordination normal.  Skin: Skin is warm and dry. No lesion and no rash noted. No erythema.  New skin lesion at bridge of her nose, new, scaly, superficial, non healing--11/17/12 Dermatology consultation: Bowen's disease-R leg. 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   Psychiatric: Cognition and memory are impaired. She exhibits abnormal recent memory.    Filed Vitals:   01/22/13 1408  BP: 170/52  Pulse: 55  Temp: 97.1 F (36.2 C)  TempSrc: Tympanic  Resp: 12   Labs reviewed: Basic Metabolic Panel:  Recent Labs  14/78/29 0500 05/06/12 1529 05/07/12 0508 07/13/12 09/28/12  NA 138 134* 135 141 141  K 3.9 3.8 3.5 4.4 3.8  CL 106 101 104  --   --   CO2 22 22 23   --   --    GLUCOSE 85 84 87  --   --   BUN 46* 47* 42* 24* 24*  CREATININE 1.46* 1.67* 1.54* 0.9 0.9  CALCIUM 8.5 8.5 8.6  --   --   PHOS  --  3.3  --   --   --   TSH  --   --  4.612*  --  4.21   Liver Function Tests:  Recent Labs  05/05/12 0233 05/06/12 1529 07/13/12 09/28/12  AST 18  --  19 17  ALT 12  --  13 11  ALKPHOS 87  --  110 87  BILITOT 0.3  --   --   --   PROT 6.5  --   --   --   ALBUMIN 3.3* 2.3*  --   --  CBC:  Recent Labs  05/05/12 0233  05/06/12 0500 05/07/12 0508 07/13/12 09/28/12  WBC 16.8*  --  12.7* 11.0* 11.7 9.1  HGB 14.8  < > 11.6* 11.7* 11.9* 11.6*  HCT 43.8  --  34.8* 34.7* 35* 36  MCV 94.8  --  94.1 94.3  --   --   PLT 310  --  252 251 404* 360  < > = values in this interval not displayed.  Past Procedures:   X-ray left hip 01/13/13 showed no new acute fracture, subluxation, or dislocation. Also showed old healed fracture deformity at the left femoral head, neck, and intertrochanteric region with orthopedic rod, plate and screws spanning form superolateral to the femoral head to the proximal shaft. Potion of the orthopedic screw appear outside the confines of the femoral head. Consider Orthopedic consultation.   Assessment/Plan Pain in left hip Chronic since the left hip fx surgical repair 06/2010-increased Tramadol to 50mg  tid and 100mg  daily  in addition to Tylenol 650mg  tid  for pain control. W/c for mobility when out of bed. X-ray 01/13/13 showed no new acute fracture, subluxation, or dislocation. Also showed old healed fracture deformity at the left femoral head, neck, and intertrochanteric region with orthopedic rod, plate and screws spanning form superolateral to the femoral head to the proximal shaft. Potion of the orthopedic screw appear outside the confines of the femoral head. Consider Orthopedic consultation.       HTN (hypertension)  Takes Imdur 30mg  for CAD. Takes Metoprolol 12.5mg  bid-Bps elevated in am--will adjust Metoprolol to 25mg  in am  to help elevated Bp in am and continue 12mg  in am since most Bp reasonably controlled in pm. Monitor Bp       Depression Stable on Cymbalta 30mg  bid            Unspecified hypothyroidism Corrected with Levothyroxine 12.65mcg, last TSH 3.598 05/18/12--4.214 09/28/12            Neuropathy Stable on Gabapentin 100mg  tid and Tylenol 650mg  tid.               Dementia Gradual decline, takes Namenda 14mg  and lives in [place].                 CAD (coronary artery disease) of artery bypass graft Stable on Imdur 30mg                  Family/ Staff Communication: observe the patient   Goals of Care: SNF  Labs/tests ordered: none.

## 2013-02-05 ENCOUNTER — Non-Acute Institutional Stay (SKILLED_NURSING_FACILITY): Payer: Medicare Other | Admitting: Nurse Practitioner

## 2013-02-05 DIAGNOSIS — N390 Urinary tract infection, site not specified: Secondary | ICD-10-CM

## 2013-02-05 DIAGNOSIS — K219 Gastro-esophageal reflux disease without esophagitis: Secondary | ICD-10-CM

## 2013-02-05 DIAGNOSIS — J42 Unspecified chronic bronchitis: Secondary | ICD-10-CM

## 2013-02-05 DIAGNOSIS — G8929 Other chronic pain: Secondary | ICD-10-CM

## 2013-02-05 DIAGNOSIS — G629 Polyneuropathy, unspecified: Secondary | ICD-10-CM

## 2013-02-05 DIAGNOSIS — G589 Mononeuropathy, unspecified: Secondary | ICD-10-CM

## 2013-02-05 DIAGNOSIS — E039 Hypothyroidism, unspecified: Secondary | ICD-10-CM

## 2013-02-05 DIAGNOSIS — F0390 Unspecified dementia without behavioral disturbance: Secondary | ICD-10-CM

## 2013-02-05 DIAGNOSIS — I25810 Atherosclerosis of coronary artery bypass graft(s) without angina pectoris: Secondary | ICD-10-CM

## 2013-02-05 DIAGNOSIS — I10 Essential (primary) hypertension: Secondary | ICD-10-CM

## 2013-02-05 DIAGNOSIS — F329 Major depressive disorder, single episode, unspecified: Secondary | ICD-10-CM

## 2013-02-07 ENCOUNTER — Encounter: Payer: Self-pay | Admitting: Nurse Practitioner

## 2013-02-07 DIAGNOSIS — N390 Urinary tract infection, site not specified: Secondary | ICD-10-CM | POA: Insufficient documentation

## 2013-02-07 HISTORY — DX: Urinary tract infection, site not specified: N39.0

## 2013-02-07 NOTE — Assessment & Plan Note (Signed)
Stable on Advair and spiriva   

## 2013-02-07 NOTE — Assessment & Plan Note (Signed)
Chronic since the left hip fx surgical repair 06/2010-takes Tramadol  50mg  tid and 100mg  daily  in addition to Tylenol 650mg  tid  for pain control. W/c for mobility when out of bed. X-ray 01/13/13 showed no new acute fracture, subluxation, or dislocation. Also showed old healed fracture deformity at the left femoral head, neck, and intertrochanteric region with orthopedic rod, plate and screws spanning form superolateral to the femoral head to the proximal shaft. Potion of the orthopedic screw appear outside the confines of the femoral head. Hold Tramadol for AMS and Ortho consult pending.

## 2013-02-07 NOTE — Progress Notes (Signed)
Patient ID: [name] [name], female   DOB: Oct 10, 1917, 80 y.o.   MRN: [id number] Code Status: DNR  No Known Allergies  Chief Complaint  Patient presents with  . Medical Managment of Chronic Issues    UTI, hallucination.     HPI: Patient is a 79 y.o. female seen in the SNF at [facility] today for evaluation of  UTI, hallucination, the left hip pain and other chronic medical conditions.  Problem List Items Addressed This Visit   CAD (coronary artery disease) of artery bypass graft     Stable on Imdur 30mg                    Chronic pain (Chronic)     Chronic since the left hip fx surgical repair 06/2010-takes Tramadol  50mg  tid and 100mg  daily  in addition to Tylenol 650mg  tid  for pain control. W/c for mobility when out of bed. X-ray 01/13/13 showed no new acute fracture, subluxation, or dislocation. Also showed old healed fracture deformity at the left femoral head, neck, and intertrochanteric region with orthopedic rod, plate and screws spanning form superolateral to the femoral head to the proximal shaft. Potion of the orthopedic screw appear outside the confines of the femoral head. Hold Tramadol for AMS and Ortho consult pending.           Dementia     Gradual decline, takes Namenda and lives in [place].                     Depression     takes Cymbalta 30mg  bid. Hx of psychotic features treated with antipsychotic. Noted hallucination-talking to people who are not present to others since onset of UTI-may consider resume Antipsychotic if no better. Also will hold Ultram for AMS-hallucination + lethargy.                 GERD (gastroesophageal reflux disease)     Hx GI bleed, stable, Hgb11.9 07/14/11-11.6 09/29/39, off  Carafate 12/14/12 and  continue Omeprazole 40mg  bid.                 HTN (hypertension) - Primary      Takes Imdur 30mg  for CAD. Better controlled unit the onset of UTI 02/03/13 with BP 158/90, takes Metoprolol 25mg  in am  and 12mg  in pm-monitor Bps.            Neuropathy     Stable on Gabapentin 100mg  tid and Tylenol 650mg  tid.                   Unspecified chronic bronchitis     Stable on Advair and spiriva      Unspecified hypothyroidism (Chronic)     Corrected with Levothyroxine 12.52mcg, last TSH 3.598 05/18/12--4.214 09/28/12                UTI (lower urinary tract infection)     Urine culture 02/03/13 showd Proteus Mirabilis >100,000c/ml, started 7 day course Cipro 500mg  bid 02/03/13 per urine susceptibility report. The patient was observed to have hallucination-talking to people who are not present to others. Hx of paranoia, suspicion, seeing people who not visible to others treated with antipsychotic agent. Differential dx: delirium with onset of UT(should be better on ABT)I vs AR of Cipro/Tramadol(hold Tramadol for now) vs acute intracranial process(no focal neurological deficit) vs ? Dehydration(may needs CBC/CMP)       Review of Systems:  Review of Systems  Constitutional: Positive for  malaise/fatigue. Negative for fever, chills, weight loss and diaphoresis.  HENT: Positive for hearing loss and neck pain. Negative for ear pain, congestion and sore throat.   Eyes: Negative for pain, discharge and redness.  Respiratory: Positive for cough and shortness of breath. Negative for sputum production and wheezing.   Cardiovascular: Positive for leg swelling (trace in ankle, chronic, venous insufficiency) and PND. Negative for chest pain, orthopnea and claudication.  Gastrointestinal: Negative for heartburn, nausea, vomiting, abdominal pain, diarrhea, constipation and blood in stool.  Genitourinary: Positive for frequency. Negative for dysuria, urgency and flank pain.  Musculoskeletal: Positive for myalgias, back pain and joint pain (left hip pain. ). Negative for falls.  Skin: Negative for itching and rash.  Neurological: Negative for dizziness, tingling, tremors, speech  change, focal weakness, seizures, loss of consciousness and weakness. Sensory change: periphearl neuropathy.  Endo/Heme/Allergies: Negative for environmental allergies and polydipsia. Does not bruise/bleed easily.  Psychiatric/Behavioral: Positive for hallucinations and memory loss. Negative for depression. The patient is not nervous/anxious and does not have insomnia.        Acute onset since UTI     Past Medical History  Diagnosis Date  . Hypertension   . COPD (chronic obstructive pulmonary disease)   . CHF (congestive heart failure)   . Coronary artery disease   . Renal disorder   . GERD (gastroesophageal reflux disease)   . Osteoporosis   . Syncope   . Dementia   . Anemia   . Parkinson disease   . Neuropathy   . Thyroid disease   . Hyperlipidemia   . Anxiety   . Depression   . Chronic pain 11/01/2011   Past Surgical History  Procedure Laterality Date  . Fracture surgery    . Esophagogastroduodenoscopy  05/05/2012    Procedure: ESOPHAGOGASTRODUODENOSCOPY (EGD);  Surgeon: [name], MD;  Location: [facility] ENDOSCOPY;  Service: Endoscopy;  Laterality: N/A;  Pediatric upper endoscope   Social History:   reports that she has never smoked. She does not have any smokeless tobacco history on file. She reports that she does not drink alcohol or use illicit drugs.  Medications: Reviewed at [facility]  Physical Exam: Physical Exam  Constitutional: She is oriented to person, place, and time. She appears well-developed and well-nourished.  HENT:  Head: Normocephalic and atraumatic.  Eyes: Conjunctivae and EOM are normal. Pupils are equal, round, and reactive to light.  Neck: Normal range of motion. No JVD present. No thyromegaly present.  Chronic decreased lateral ROM of the neck   Cardiovascular: Normal rate, regular rhythm and normal heart sounds.   No murmur heard. Pulmonary/Chest: Effort normal. She has rales.  Bibasilar moist rales and occasional wheezes  Abdominal: Soft.  Bowel sounds are normal.  Musculoskeletal: She exhibits edema and tenderness.  Mainly pain in neck and left hip  Lymphadenopathy:    She has no cervical adenopathy.  Neurological: She is alert and oriented to person, place, and time. She has normal reflexes. No cranial nerve deficit. She exhibits normal muscle tone. Coordination normal.  Skin: Skin is warm and dry. No lesion and no rash noted. No erythema.  New skin lesion at bridge of her nose, new, scaly, superficial, non healing--11/17/12 Dermatology consultation: Bowen's disease-R leg. 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   Psychiatric: Cognition and memory are impaired. She exhibits abnormal recent memory.    Filed Vitals:   02/07/13 1040  BP: 158/90  Pulse: 60  Temp: 98.4 F (36.9 C)  TempSrc: Tympanic  Resp: 18   Labs reviewed: Basic Metabolic Panel:  Recent Labs  16/10/96 0500 05/06/12 1529 05/07/12 0508 07/13/12 09/28/12  NA 138 134* 135 141 141  K 3.9 3.8 3.5 4.4 3.8  CL 106 101 104  --   --   CO2 22 22 23   --   --   GLUCOSE 85 84 87  --   --   BUN 46* 47* 42* 24* 24*  CREATININE 1.46* 1.67* 1.54* 0.9 0.9  CALCIUM 8.5 8.5 8.6  --   --   PHOS  --  3.3  --   --   --   TSH  --   --  4.612*  --  4.21   Liver Function Tests:  Recent Labs  05/05/12 0233 05/06/12 1529 07/13/12 09/28/12  AST 18  --  19 17  ALT 12  --  13 11  ALKPHOS 87  --  110 87  BILITOT 0.3  --   --   --   PROT 6.5  --   --   --   ALBUMIN 3.3* 2.3*  --   --    CBC:  Recent Labs  05/05/12 0233  05/06/12 0500 05/07/12 0508 07/13/12 09/28/12  WBC 16.8*  --  12.7* 11.0* 11.7 9.1  HGB 14.8  < > 11.6* 11.7* 11.9* 11.6*  HCT 43.8  --  34.8* 34.7* 35* 36  MCV 94.8  --  94.1 94.3  --   --   PLT 310  --  252 251 404* 360  < > = values in this interval not displayed.  Past Procedures:   X-ray left hip 01/13/13 showed no new acute fracture, subluxation, or dislocation. Also showed old healed  fracture deformity at the left femoral head, neck, and intertrochanteric region with orthopedic rod, plate and screws spanning form superolateral to the femoral head to the proximal shaft. Potion of the orthopedic screw appear outside the confines of the femoral head. Consider Orthopedic consultation.   Assessment/Plan HTN (hypertension)  Takes Imdur 30mg  for CAD. Better controlled unit the onset of UTI 02/03/13 with BP 158/90, takes Metoprolol 25mg  in am and 12mg  in pm-monitor Bps.          Depression takes Cymbalta 30mg  bid. Hx of psychotic features treated with antipsychotic. Noted hallucination-talking to people who are not present to others since onset of UTI-may consider resume Antipsychotic if no better. Also will hold Ultram for AMS-hallucination + lethargy.               Dementia Gradual decline, takes Namenda and lives in [place].                   Chronic pain Chronic since the left hip fx surgical repair 06/2010-takes Tramadol  50mg  tid and 100mg  daily  in addition to Tylenol 650mg  tid  for pain control. W/c for mobility when out of bed. X-ray 01/13/13 showed no new acute fracture, subluxation, or dislocation. Also showed old healed fracture deformity at the left femoral head, neck, and intertrochanteric region with orthopedic rod, plate and screws spanning form superolateral to the femoral head to the proximal shaft. Potion of the orthopedic screw appear outside the confines of the femoral head. Hold Tramadol for AMS and Ortho consult pending.         CAD (coronary artery disease) of artery bypass graft Stable on Imdur 30mg   GERD (gastroesophageal reflux disease) Hx GI bleed, stable, Hgb11.9 07/14/11-11.6 09/29/39, off  Carafate 12/14/12 and  continue Omeprazole 40mg  bid.               Neuropathy Stable on Gabapentin 100mg  tid and Tylenol 650mg  tid.                 Unspecified  hypothyroidism Corrected with Levothyroxine 12.27mcg, last TSH 3.598 05/18/12--4.214 09/28/12              Unspecified chronic bronchitis Stable on Advair and spiriva    UTI (lower urinary tract infection) Urine culture 02/03/13 showd Proteus Mirabilis >100,000c/ml, started 7 day course Cipro 500mg  bid 02/03/13 per urine susceptibility report. The patient was observed to have hallucination-talking to people who are not present to others. Hx of paranoia, suspicion, seeing people who not visible to others treated with antipsychotic agent. Differential dx: delirium with onset of UT(should be better on ABT)I vs AR of Cipro/Tramadol(hold Tramadol for now) vs acute intracranial process(no focal neurological deficit) vs ? Dehydration(may needs CBC/CMP)    Family/ Staff Communication: observe the patient   Goals of Care: SNF  Labs/tests ordered: none.

## 2013-02-07 NOTE — Assessment & Plan Note (Signed)
Gradual decline, takes Namenda and lives in SNF.  

## 2013-02-07 NOTE — Assessment & Plan Note (Signed)
Stable on Imdur 30mg                      

## 2013-02-07 NOTE — Assessment & Plan Note (Signed)
Takes Imdur 30mg  for CAD. Better controlled unit the onset of UTI 02/03/13 with BP 158/90, takes Metoprolol 25mg  in am and 12mg  in pm-monitor Bps.

## 2013-02-07 NOTE — Assessment & Plan Note (Signed)
Stable on Gabapentin 100mg tid and Tylenol 650mg tid.   

## 2013-02-07 NOTE — Assessment & Plan Note (Signed)
takes Cymbalta 30mg  bid. Hx of psychotic features treated with antipsychotic. Noted hallucination-talking to people who are not present to others since onset of UTI-may consider resume Antipsychotic if no better. Also will hold Ultram for AMS-hallucination + lethargy.

## 2013-02-07 NOTE — Assessment & Plan Note (Signed)
Hx GI bleed, stable, Hgb11.9 07/14/11-11.6 09/29/39, off  Carafate 12/14/12 and  continue Omeprazole 40mg bid.   

## 2013-02-07 NOTE — Assessment & Plan Note (Signed)
Urine culture 02/03/13 showd Proteus Mirabilis >100,000c/ml, started 7 day course Cipro 500mg  bid 02/03/13 per urine susceptibility report. The patient was observed to have hallucination-talking to people who are not present to others. Hx of paranoia, suspicion, seeing people who not visible to others treated with antipsychotic agent. Differential dx: delirium with onset of UT(should be better on ABT)I vs AR of Cipro/Tramadol(hold Tramadol for now) vs acute intracranial process(no focal neurological deficit) vs ? Dehydration(may needs CBC/CMP)

## 2013-02-07 NOTE — Assessment & Plan Note (Signed)
Corrected with Levothyroxine 12.50mcg, last TSH 3.598 05/18/12--4.214 09/28/12

## 2013-02-23 ENCOUNTER — Non-Acute Institutional Stay (SKILLED_NURSING_FACILITY): Payer: Medicare Other | Admitting: Nurse Practitioner

## 2013-02-23 ENCOUNTER — Encounter: Payer: Self-pay | Admitting: Nurse Practitioner

## 2013-02-23 DIAGNOSIS — G629 Polyneuropathy, unspecified: Secondary | ICD-10-CM

## 2013-02-23 DIAGNOSIS — A6009 Herpesviral infection of other urogenital tract: Secondary | ICD-10-CM

## 2013-02-23 DIAGNOSIS — G589 Mononeuropathy, unspecified: Secondary | ICD-10-CM

## 2013-02-23 DIAGNOSIS — F411 Generalized anxiety disorder: Secondary | ICD-10-CM

## 2013-02-23 DIAGNOSIS — F0390 Unspecified dementia without behavioral disturbance: Secondary | ICD-10-CM

## 2013-02-23 DIAGNOSIS — K219 Gastro-esophageal reflux disease without esophagitis: Secondary | ICD-10-CM

## 2013-02-23 DIAGNOSIS — D649 Anemia, unspecified: Secondary | ICD-10-CM

## 2013-02-23 DIAGNOSIS — N179 Acute kidney failure, unspecified: Secondary | ICD-10-CM

## 2013-02-23 DIAGNOSIS — E039 Hypothyroidism, unspecified: Secondary | ICD-10-CM

## 2013-02-23 DIAGNOSIS — I10 Essential (primary) hypertension: Secondary | ICD-10-CM

## 2013-02-23 DIAGNOSIS — M25559 Pain in unspecified hip: Secondary | ICD-10-CM

## 2013-02-23 DIAGNOSIS — A6000 Herpesviral infection of urogenital system, unspecified: Secondary | ICD-10-CM

## 2013-02-23 DIAGNOSIS — M25552 Pain in left hip: Secondary | ICD-10-CM

## 2013-02-23 DIAGNOSIS — F419 Anxiety disorder, unspecified: Secondary | ICD-10-CM

## 2013-02-23 DIAGNOSIS — N189 Chronic kidney disease, unspecified: Secondary | ICD-10-CM

## 2013-02-23 NOTE — Assessment & Plan Note (Signed)
Update CMP.  

## 2013-02-23 NOTE — Assessment & Plan Note (Addendum)
takes Cymbalta 30mg  bid. Hx of psychotic features treated with antipsychotic-recently relapse was associated with UTI.

## 2013-02-23 NOTE — Assessment & Plan Note (Signed)
Stable on Gabapentin 100mg tid and Tylenol 650mg tid.   

## 2013-02-23 NOTE — Assessment & Plan Note (Signed)
Repeat CBC

## 2013-02-23 NOTE — Assessment & Plan Note (Signed)
Takes Acyclovir chronic for suppression therapy.     

## 2013-02-23 NOTE — Assessment & Plan Note (Signed)
Corrected with Levothyroxine 12.77mcg, last TSH 3.598 05/18/12--4.214 09/28/12. Update TSH

## 2013-02-23 NOTE — Assessment & Plan Note (Signed)
Hx GI bleed, stable, Hgb11.9 07/14/11-11.6 09/29/39, off  Carafate 12/14/12 and  continue Omeprazole 40mg bid.   

## 2013-02-23 NOTE — Assessment & Plan Note (Signed)
Chronic since the left hip fx surgical repair 06/2010-increased Tramadol to 50mg  tid and 100mg  daily  in addition to Tylenol 650mg  tid  for pain control. W/c for mobility when out of bed. X-ray 01/13/13 showed no new acute fracture, subluxation, or dislocation. Also showed old healed fracture deformity at the left femoral head, neck, and intertrochanteric region with orthopedic rod, plate and screws spanning form superolateral to the femoral head to the proximal shaft. Potion of the orthopedic screw appear outside the confines of the femoral head. Consider Orthopedic consultation.

## 2013-02-23 NOTE — Progress Notes (Signed)
Patient ID: Randolm Idol, female   DOB: 08-20-1917, 79 y.o.   MRN: [id number]  Code Status: DNR  No Known Allergies  Chief Complaint  Patient presents with  . Medical Managment of Chronic Issues    blood pressure    HPI: Patient is a 79 y.o. female seen in the SNF at [facility] today for evaluation of blood pressure,  the left hip pain and other chronic medical conditions.  Problem List Items Addressed This Visit   Anemia - Primary     Repeat CBC      Anxiety     takes Cymbalta 30mg  bid. Hx of psychotic features treated with antipsychotic-recently relapse was associated with UTI.                   Dementia     Gradual decline, takes Namenda and lives in [place].                       GERD (gastroesophageal reflux disease)     Hx GI bleed, stable, Hgb11.9 07/14/11-11.6 09/29/39, off  Carafate 12/14/12 and  continue Omeprazole 40mg  bid.                   Herpes genitalis in women     Takes Acyclovir chronic for suppression therapy.       HTN (hypertension)      Takes Imdur 30mg  for CAD. Bp 176/80, P 68-will increase Metoprolol to 25mg  bid.              Neuropathy     Stable on Gabapentin 100mg  tid and Tylenol 650mg  tid.                     Pain in left hip     Chronic since the left hip fx surgical repair 06/2010-increased Tramadol to 50mg  tid and 100mg  daily  in addition to Tylenol 650mg  tid  for pain control. W/c for mobility when out of bed. X-ray 01/13/13 showed no new acute fracture, subluxation, or dislocation. Also showed old healed fracture deformity at the left femoral head, neck, and intertrochanteric region with orthopedic rod, plate and screws spanning form superolateral to the femoral head to the proximal shaft. Potion of the orthopedic screw appear outside the confines of the femoral head. Consider Orthopedic consultation.           Renal failure (ARF), acute on chronic     Update CMP     Unspecified hypothyroidism (Chronic)     Corrected with Levothyroxine 12.60mcg, last TSH 3.598 05/18/12--4.214 09/28/12. Update TSH                     Review of Systems:  Review of Systems  Constitutional: Positive for malaise/fatigue. Negative for fever, chills, weight loss and diaphoresis.  HENT: Positive for hearing loss. Negative for congestion, ear pain and sore throat.   Eyes: Negative for pain, discharge and redness.  Respiratory: Positive for cough and shortness of breath. Negative for sputum production and wheezing.   Cardiovascular: Positive for leg swelling (trace in ankle, chronic, venous insufficiency) and PND. Negative for chest pain, orthopnea and claudication.  Gastrointestinal: Negative for heartburn, nausea, vomiting, abdominal pain, diarrhea, constipation and blood in stool.  Genitourinary: Positive for frequency. Negative for dysuria, urgency and flank pain.  Musculoskeletal: Positive for back pain, joint pain (left hip pain. ), myalgias and neck pain. Negative for falls.  Skin: Negative for itching  and rash.  Neurological: Negative for dizziness, tingling, tremors, speech change, focal weakness, seizures, loss of consciousness and weakness. Sensory change: periphearl neuropathy.  Endo/Heme/Allergies: Negative for environmental allergies and polydipsia. Does not bruise/bleed easily.  Psychiatric/Behavioral: Positive for memory loss. Negative for depression and hallucinations. The patient is not nervous/anxious and does not have insomnia.      Past Medical History  Diagnosis Date  . Hypertension   . COPD (chronic obstructive pulmonary disease)   . CHF (congestive heart failure)   . Coronary artery disease   . Renal disorder   . GERD (gastroesophageal reflux disease)   . Osteoporosis   . Syncope   . Dementia   . Anemia   . Parkinson disease   . Neuropathy   . Thyroid disease   . Hyperlipidemia   . Anxiety   . Depression   . Chronic pain 11/01/2011    Past Surgical History  Procedure Laterality Date  . Fracture surgery    . Esophagogastroduodenoscopy  05/05/2012    Procedure: ESOPHAGOGASTRODUODENOSCOPY (EGD);  Surgeon: [name], MD;  Location: [facility] ENDOSCOPY;  Service: Endoscopy;  Laterality: N/A;  Pediatric upper endoscope   Social History:   reports that she has never smoked. She does not have any smokeless tobacco history on file. She reports that she does not drink alcohol or use illicit drugs.  Medications: Reviewed at [facility]  Physical Exam: Physical Exam  Constitutional: She is oriented to person, place, and time. She appears well-developed and well-nourished.  HENT:  Head: Normocephalic and atraumatic.  Eyes: Conjunctivae and EOM are normal. Pupils are equal, round, and reactive to light.  Neck: Normal range of motion. No JVD present. No thyromegaly present.  Chronic decreased lateral ROM of the neck   Cardiovascular: Normal rate, regular rhythm and normal heart sounds.   No murmur heard. Pulmonary/Chest: Effort normal. She has rales.  Bibasilar moist rales and occasional wheezes  Abdominal: Soft. Bowel sounds are normal.  Musculoskeletal: She exhibits edema and tenderness.  Mainly pain in neck and left hip  Lymphadenopathy:    She has no cervical adenopathy.  Neurological: She is alert and oriented to person, place, and time. She has normal reflexes. No cranial nerve deficit. She exhibits normal muscle tone. Coordination normal.  Skin: Skin is warm and dry. No lesion and no rash noted. No erythema.  New skin lesion at bridge of her nose, new, scaly, superficial, non healing--11/17/12 Dermatology consultation: Bowen's disease-R leg. 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   Psychiatric: Cognition and memory are impaired. She exhibits abnormal recent memory.    Filed Vitals:   02/23/13 1630  BP: 176/80  Pulse: 68  Temp: 97.2 F (36.2 C)  TempSrc: Tympanic  Resp: 16    Labs reviewed: Basic Metabolic Panel:  Recent Labs  16/10/96 0500 05/06/12 1529 05/07/12 0508 07/13/12 09/28/12  NA 138 134* 135 141 141  K 3.9 3.8 3.5 4.4 3.8  CL 106 101 104  --   --   CO2 22 22 23   --   --   GLUCOSE 85 84 87  --   --   BUN 46* 47* 42* 24* 24*  CREATININE 1.46* 1.67* 1.54* 0.9 0.9  CALCIUM 8.5 8.5 8.6  --   --   PHOS  --  3.3  --   --   --   TSH  --   --  4.612*  --  4.21   Liver Function Tests:  Recent  Labs  05/05/12 0233 05/06/12 1529 07/13/12 09/28/12  AST 18  --  19 17  ALT 12  --  13 11  ALKPHOS 87  --  110 87  BILITOT 0.3  --   --   --   PROT 6.5  --   --   --   ALBUMIN 3.3* 2.3*  --   --    CBC:  Recent Labs  05/05/12 0233  05/06/12 0500 05/07/12 0508 07/13/12 09/28/12  WBC 16.8*  --  12.7* 11.0* 11.7 9.1  HGB 14.8  < > 11.6* 11.7* 11.9* 11.6*  HCT 43.8  --  34.8* 34.7* 35* 36  MCV 94.8  --  94.1 94.3  --   --   PLT 310  --  252 251 404* 360  < > = values in this interval not displayed.  Past Procedures:   X-ray left hip 01/13/13 showed no new acute fracture, subluxation, or dislocation. Also showed old healed fracture deformity at the left femoral head, neck, and intertrochanteric region with orthopedic rod, plate and screws spanning form superolateral to the femoral head to the proximal shaft. Potion of the orthopedic screw appear outside the confines of the femoral head. Consider Orthopedic consultation.   Assessment/Plan Anemia Repeat CBC    Anxiety takes Cymbalta 30mg  bid. Hx of psychotic features treated with antipsychotic-recently relapse was associated with UTI.                 Dementia Gradual decline, takes Namenda and lives in [place].                     GERD (gastroesophageal reflux disease) Hx GI bleed, stable, Hgb11.9 07/14/11-11.6 09/29/39, off  Carafate 12/14/12 and  continue Omeprazole 40mg  bid.                 Herpes genitalis in women Takes Acyclovir chronic for  suppression therapy.     HTN (hypertension)  Takes Imdur 30mg  for CAD. Bp 176/80, P 68-will increase Metoprolol to 25mg  bid.            Neuropathy Stable on Gabapentin 100mg  tid and Tylenol 650mg  tid.                   Pain in left hip Chronic since the left hip fx surgical repair 06/2010-increased Tramadol to 50mg  tid and 100mg  daily  in addition to Tylenol 650mg  tid  for pain control. W/c for mobility when out of bed. X-ray 01/13/13 showed no new acute fracture, subluxation, or dislocation. Also showed old healed fracture deformity at the left femoral head, neck, and intertrochanteric region with orthopedic rod, plate and screws spanning form superolateral to the femoral head to the proximal shaft. Potion of the orthopedic screw appear outside the confines of the femoral head. Consider Orthopedic consultation.         Renal failure (ARF), acute on chronic Update CMP  Unspecified hypothyroidism Corrected with Levothyroxine 12.2mcg, last TSH 3.598 05/18/12--4.214 09/28/12. Update TSH                  Family/ Staff Communication: observe the patient   Goals of Care: SNF  Labs/tests ordered: CBC, CMP, TSH

## 2013-02-23 NOTE — Assessment & Plan Note (Signed)
Takes Imdur 30mg  for CAD. Bp 176/80, P 68-will increase Metoprolol to 25mg  bid.

## 2013-02-23 NOTE — Assessment & Plan Note (Signed)
Gradual decline, takes Namenda and lives in SNF.  

## 2013-02-25 LAB — CBC AND DIFFERENTIAL: HCT: 41 % (ref 36–46)

## 2013-02-25 LAB — TSH: TSH: 5.47 u[IU]/mL (ref 0.41–5.90)

## 2013-03-16 ENCOUNTER — Non-Acute Institutional Stay (SKILLED_NURSING_FACILITY): Payer: Medicare Other | Admitting: Nurse Practitioner

## 2013-03-16 ENCOUNTER — Encounter: Payer: Self-pay | Admitting: Nurse Practitioner

## 2013-03-16 DIAGNOSIS — F411 Generalized anxiety disorder: Secondary | ICD-10-CM

## 2013-03-16 DIAGNOSIS — J42 Unspecified chronic bronchitis: Secondary | ICD-10-CM

## 2013-03-16 DIAGNOSIS — F419 Anxiety disorder, unspecified: Secondary | ICD-10-CM

## 2013-03-16 DIAGNOSIS — M25552 Pain in left hip: Secondary | ICD-10-CM

## 2013-03-16 DIAGNOSIS — E039 Hypothyroidism, unspecified: Secondary | ICD-10-CM

## 2013-03-16 DIAGNOSIS — D649 Anemia, unspecified: Secondary | ICD-10-CM

## 2013-03-16 DIAGNOSIS — G589 Mononeuropathy, unspecified: Secondary | ICD-10-CM

## 2013-03-16 DIAGNOSIS — M25559 Pain in unspecified hip: Secondary | ICD-10-CM

## 2013-03-16 DIAGNOSIS — G629 Polyneuropathy, unspecified: Secondary | ICD-10-CM

## 2013-03-16 DIAGNOSIS — K219 Gastro-esophageal reflux disease without esophagitis: Secondary | ICD-10-CM

## 2013-03-16 DIAGNOSIS — I10 Essential (primary) hypertension: Secondary | ICD-10-CM

## 2013-03-16 DIAGNOSIS — I25810 Atherosclerosis of coronary artery bypass graft(s) without angina pectoris: Secondary | ICD-10-CM

## 2013-03-16 DIAGNOSIS — F0390 Unspecified dementia without behavioral disturbance: Secondary | ICD-10-CM

## 2013-03-16 NOTE — Assessment & Plan Note (Signed)
Stable on Gabapentin 100mg tid and Tylenol 650mg tid.   

## 2013-03-16 NOTE — Assessment & Plan Note (Signed)
Takes Imdur 30mg  for CAD. Controlled on Metoprolol to 25mg  bid.

## 2013-03-16 NOTE — Assessment & Plan Note (Signed)
Stable on Imdur 30mg                      

## 2013-03-16 NOTE — Assessment & Plan Note (Signed)
Maintained on Spiriva and Advair.        

## 2013-03-16 NOTE — Assessment & Plan Note (Signed)
Resolved, Hgb 13.3 02/25/13

## 2013-03-16 NOTE — Assessment & Plan Note (Signed)
takes Cymbalta 30mg bid-stable.    

## 2013-03-16 NOTE — Assessment & Plan Note (Signed)
Corrected with Levothyroxine 12.33mcg, last TSH 3.598 05/18/12--4.214 09/28/12--5.471 02/25/13--will increase Levothyroxine to , f/u TSH scheduled.

## 2013-03-16 NOTE — Assessment & Plan Note (Signed)
Hx GI bleed, stable, Hgb11.9 07/14/11-11.6 09/29/39, off  Carafate 12/14/12 and  continue Omeprazole 40mg bid.   

## 2013-03-16 NOTE — Assessment & Plan Note (Signed)
Gradual decline, takes Namenda and lives in SNF.  

## 2013-03-16 NOTE — Assessment & Plan Note (Signed)
Chronic since the left hip fx surgical repair 06/2010. W/c for mobility when out of bed. X-ray 01/13/13 showed no new acute fracture, subluxation, or dislocation. Also showed old healed fracture deformity at the left femoral head, neck, and intertrochanteric region with orthopedic rod, plate and screws spanning form superolateral to the femoral head to the proximal shaft. Potion of the orthopedic screw appear outside the confines of the femoral head. Consider Orthopedic consultation 03/08/13 activity as needed and w/c since weight bearing is uncomfortable. Continue Tylenol and Tramadol. Add Celebrex 200mg  daily. Observe the patient. Check BMP

## 2013-03-16 NOTE — Progress Notes (Signed)
Patient ID: [name] [name], female   DOB: 03-Jun-1917, 79 y.o.   MRN: [id number]  Code Status: DNR  No Known Allergies  Chief Complaint  Patient presents with  . Medical Managment of Chronic Issues    left hip pain.   . Acute Visit    HPI: Patient is a 79 y.o. female seen in the SNF at [facility] today for evaluation of blood pressure, thyroid, the left hip pain and other chronic medical conditions.  Problem List Items Addressed This Visit   Anemia - Primary     Resolved, Hgb 13.3 02/25/13        Anxiety     takes Cymbalta 30mg  bid-stable.                    CAD (coronary artery disease) of artery bypass graft     Stable on Imdur 30mg                      Dementia     Gradual decline, takes Namenda and lives in [place].                         GERD (gastroesophageal reflux disease)     Hx GI bleed, stable, Hgb11.9 07/14/11-11.6 09/29/39, off  Carafate 12/14/12 and  continue Omeprazole 40mg  bid.                     HTN (hypertension)      Takes Imdur 30mg  for CAD. Controlled on Metoprolol to 25mg  bid.                Neuropathy     Stable on Gabapentin 100mg  tid and Tylenol 650mg  tid.                       Pain in left hip     Chronic since the left hip fx surgical repair 06/2010. W/c for mobility when out of bed. X-ray 01/13/13 showed no new acute fracture, subluxation, or dislocation. Also showed old healed fracture deformity at the left femoral head, neck, and intertrochanteric region with orthopedic rod, plate and screws spanning form superolateral to the femoral head to the proximal shaft. Potion of the orthopedic screw appear outside the confines of the femoral head. Consider Orthopedic consultation 03/08/13 activity as needed and w/c since weight bearing is uncomfortable. Continue Tylenol and Tramadol. Add Celebrex 200mg  daily. Observe the patient. Check BMP            Unspecified chronic bronchitis     Maintained on Spiriva and Advair.       Unspecified hypothyroidism (Chronic)     Corrected with Levothyroxine 12.58mcg, last TSH 3.598 05/18/12--4.214 09/28/12--5.471 02/25/13--will increase Levothyroxine to , f/u TSH scheduled.                      Review of Systems:  Review of Systems  Constitutional: Positive for malaise/fatigue. Negative for fever, chills, weight loss and diaphoresis.  HENT: Positive for hearing loss. Negative for congestion, ear pain and sore throat.   Eyes: Negative for pain, discharge and redness.  Respiratory: Positive for cough and shortness of breath. Negative for sputum production and wheezing.   Cardiovascular: Positive for leg swelling (trace in ankle, chronic, venous insufficiency) and PND. Negative for chest pain, orthopnea and claudication.  Gastrointestinal: Negative for heartburn, nausea, vomiting, abdominal pain, diarrhea, constipation and blood in stool.  Genitourinary: Positive for frequency. Negative for dysuria, urgency and flank pain.  Musculoskeletal: Positive for back pain, joint pain (left hip pain. ), myalgias and neck pain. Negative for falls.  Skin: Negative for itching and rash.  Neurological: Negative for dizziness, tingling, tremors, speech change, focal weakness, seizures, loss of consciousness and weakness. Sensory change: periphearl neuropathy.  Endo/Heme/Allergies: Negative for environmental allergies and polydipsia. Does not bruise/bleed easily.  Psychiatric/Behavioral: Positive for memory loss. Negative for depression and hallucinations. The patient is not nervous/anxious and does not have insomnia.      Past Medical History  Diagnosis Date  . Hypertension   . COPD (chronic obstructive pulmonary disease)   . CHF (congestive heart failure)   . Coronary artery disease   . Renal disorder   . GERD (gastroesophageal reflux disease)   . Osteoporosis   . Syncope   . Dementia   .  Anemia   . Parkinson disease   . Neuropathy   . Thyroid disease   . Hyperlipidemia   . Anxiety   . Depression   . Chronic pain 11/01/2011   Past Surgical History  Procedure Laterality Date  . Fracture surgery    . Esophagogastroduodenoscopy  05/05/2012    Procedure: ESOPHAGOGASTRODUODENOSCOPY (EGD);  Surgeon: [name], MD;  Location: [facility] ENDOSCOPY;  Service: Endoscopy;  Laterality: N/A;  Pediatric upper endoscope   Social History:   reports that she has never smoked. She does not have any smokeless tobacco history on file. She reports that she does not drink alcohol or use illicit drugs.  Medications: Reviewed at [facility]  Physical Exam: Physical Exam  Constitutional: She is oriented to person, place, and time. She appears well-developed and well-nourished.  HENT:  Head: Normocephalic and atraumatic.  Eyes: Conjunctivae and EOM are normal. Pupils are equal, round, and reactive to light.  Neck: Normal range of motion. No JVD present. No thyromegaly present.  Chronic decreased lateral ROM of the neck   Cardiovascular: Normal rate, regular rhythm and normal heart sounds.   No murmur heard. Pulmonary/Chest: Effort normal. She has rales.  Bibasilar moist rales and occasional wheezes  Abdominal: Soft. Bowel sounds are normal.  Musculoskeletal: She exhibits edema and tenderness.  Mainly pain in neck and left hip  Lymphadenopathy:    She has no cervical adenopathy.  Neurological: She is alert and oriented to person, place, and time. She has normal reflexes. No cranial nerve deficit. She exhibits normal muscle tone. Coordination normal.  Skin: Skin is warm and dry. No lesion and no rash noted. No erythema.  New skin lesion at bridge of her nose, new, scaly, superficial, non healing--11/17/12 Dermatology consultation: Bowen's disease-R leg. 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   Psychiatric: Cognition and memory are impaired. She exhibits  abnormal recent memory.    Filed Vitals:   03/16/13 1252  BP: 124/80  Pulse: 55  Temp: 97.6 F (36.4 C)  TempSrc: Tympanic  Resp: 18   Labs reviewed: Basic Metabolic Panel:  Recent Labs  09/60/45 0500 05/06/12 1529 05/07/12 0508 07/13/12 09/28/12 02/25/13  NA 138 134* 135 141 141  --   K 3.9 3.8 3.5 4.4 3.8  --   CL 106 101 104  --   --   --   CO2 22 22 23   --   --   --   GLUCOSE 85 84 87  --   --   --   BUN 46* 47* 42* 24* 24*  --  CREATININE 1.46* 1.67* 1.54* 0.9 0.9  --   CALCIUM 8.5 8.5 8.6  --   --   --   PHOS  --  3.3  --   --   --   --   TSH  --   --  4.612*  --  4.21 5.47   Liver Function Tests:  Recent Labs  05/05/12 0233 05/06/12 1529 07/13/12 09/28/12  AST 18  --  19 17  ALT 12  --  13 11  ALKPHOS 87  --  110 87  BILITOT 0.3  --   --   --   PROT 6.5  --   --   --   ALBUMIN 3.3* 2.3*  --   --    CBC:  Recent Labs  05/05/12 0233  05/06/12 0500 05/07/12 0508 07/13/12 09/28/12 02/25/13  WBC 16.8*  --  12.7* 11.0* 11.7 9.1 10.0  HGB 14.8  < > 11.6* 11.7* 11.9* 11.6* 13.3  HCT 43.8  --  34.8* 34.7* 35* 36 41  MCV 94.8  --  94.1 94.3  --   --   --   PLT 310  --  252 251 404* 360 371  < > = values in this interval not displayed.  Past Procedures:   X-ray left hip 01/13/13 showed no new acute fracture, subluxation, or dislocation. Also showed old healed fracture deformity at the left femoral head, neck, and intertrochanteric region with orthopedic rod, plate and screws spanning form superolateral to the femoral head to the proximal shaft. Potion of the orthopedic screw appear outside the confines of the femoral head. Consider Orthopedic consultation.   Assessment/Plan Anemia Resolved, Hgb 13.3 02/25/13      Anxiety takes Cymbalta 30mg  bid-stable.                  Pain in left hip Chronic since the left hip fx surgical repair 06/2010. W/c for mobility when out of bed. X-ray 01/13/13 showed no new acute fracture, subluxation, or  dislocation. Also showed old healed fracture deformity at the left femoral head, neck, and intertrochanteric region with orthopedic rod, plate and screws spanning form superolateral to the femoral head to the proximal shaft. Potion of the orthopedic screw appear outside the confines of the femoral head. Consider Orthopedic consultation 03/08/13 activity as needed and w/c since weight bearing is uncomfortable. Continue Tylenol and Tramadol. Add Celebrex 200mg  daily. Observe the patient. Check BMP          Unspecified hypothyroidism Corrected with Levothyroxine 12.82mcg, last TSH 3.598 05/18/12--4.214 09/28/12--5.471 02/25/13--will increase Levothyroxine to , f/u TSH scheduled.                 CAD (coronary artery disease) of artery bypass graft Stable on Imdur 30mg                    Neuropathy Stable on Gabapentin 100mg  tid and Tylenol 650mg  tid.                     HTN (hypertension)  Takes Imdur 30mg  for CAD. Controlled on Metoprolol to 25mg  bid.              GERD (gastroesophageal reflux disease) Hx GI bleed, stable, Hgb11.9 07/14/11-11.6 09/29/39, off  Carafate 12/14/12 and  continue Omeprazole 40mg  bid.                   Dementia Gradual decline, takes Namenda and lives in [place].  Unspecified chronic bronchitis Maintained on Spiriva and Advair.       Family/ Staff Communication: observe the patient   Goals of Care: SNF  Labs/tests ordered: BMP

## 2013-03-18 LAB — BASIC METABOLIC PANEL
BUN: 20 mg/dL (ref 4–21)
CREATININE: 1 mg/dL (ref 0.5–1.1)
Glucose: 85 mg/dL
Potassium: 4.1 mmol/L (ref 3.4–5.3)
Sodium: 141 mmol/L (ref 137–147)

## 2013-04-05 ENCOUNTER — Other Ambulatory Visit: Payer: Self-pay

## 2013-04-05 MED ORDER — TRAMADOL HCL 50 MG PO TABS: ORAL_TABLET | ORAL | Status: DC

## 2013-04-13 ENCOUNTER — Encounter: Payer: Self-pay | Admitting: Nurse Practitioner

## 2013-04-13 ENCOUNTER — Non-Acute Institutional Stay (SKILLED_NURSING_FACILITY): Payer: Medicare Other | Admitting: Nurse Practitioner

## 2013-04-13 DIAGNOSIS — G589 Mononeuropathy, unspecified: Secondary | ICD-10-CM

## 2013-04-13 DIAGNOSIS — J42 Unspecified chronic bronchitis: Secondary | ICD-10-CM

## 2013-04-13 DIAGNOSIS — M25559 Pain in unspecified hip: Secondary | ICD-10-CM

## 2013-04-13 DIAGNOSIS — I83009 Varicose veins of unspecified lower extremity with ulcer of unspecified site: Secondary | ICD-10-CM

## 2013-04-13 DIAGNOSIS — I25810 Atherosclerosis of coronary artery bypass graft(s) without angina pectoris: Secondary | ICD-10-CM

## 2013-04-13 DIAGNOSIS — F0390 Unspecified dementia without behavioral disturbance: Secondary | ICD-10-CM

## 2013-04-13 DIAGNOSIS — I83019 Varicose veins of right lower extremity with ulcer of unspecified site: Secondary | ICD-10-CM | POA: Insufficient documentation

## 2013-04-13 DIAGNOSIS — G629 Polyneuropathy, unspecified: Secondary | ICD-10-CM

## 2013-04-13 DIAGNOSIS — M25552 Pain in left hip: Secondary | ICD-10-CM

## 2013-04-13 DIAGNOSIS — F329 Major depressive disorder, single episode, unspecified: Secondary | ICD-10-CM

## 2013-04-13 DIAGNOSIS — I10 Essential (primary) hypertension: Secondary | ICD-10-CM

## 2013-04-13 DIAGNOSIS — E039 Hypothyroidism, unspecified: Secondary | ICD-10-CM

## 2013-04-13 NOTE — Assessment & Plan Note (Signed)
Stable on Imdur 30mg                      

## 2013-04-13 NOTE — Assessment & Plan Note (Signed)
The lateral right middle lower leg: skin tear 02/16/13-developed into a stasis ulcer with yellow slough-will apply hydrogel and alginate dressing as wet to moist dressing to debride the yellow slough.

## 2013-04-13 NOTE — Assessment & Plan Note (Signed)
Gradual decline, takes Namenda and lives in SNF.  

## 2013-04-13 NOTE — Assessment & Plan Note (Signed)
Corrected with Levothyroxine 12.15mcg, last TSH 3.598 05/18/12--4.214 09/28/12--5.471 02/25/13-increased Levothyroxine to , f/u TSH scheduled.

## 2013-04-13 NOTE — Assessment & Plan Note (Signed)
Maintained on Spiriva and Advair.        

## 2013-04-13 NOTE — Assessment & Plan Note (Signed)
Stable on Gabapentin 100mg tid and Tylenol 650mg tid.   

## 2013-04-13 NOTE — Assessment & Plan Note (Signed)
Takes Imdur 30mg for CAD. Controlled on Metoprolol to 25mg bid.             

## 2013-04-13 NOTE — Progress Notes (Signed)
Patient ID: [name] [name], female   DOB: 07-12-1917, 79 y.o.   MRN: [id number]   Code Status: DNR  No Known Allergies  Chief Complaint  Patient presents with  . Medical Managment of Chronic Issues    the right lateral middle lower leg stasis ulcer.   . Acute Visit    HPI: Patient is a 79 y.o. female seen in the SNF at [facility] today for evaluation of RLE stasis ulcer and other chronic medical conditions.  Problem List Items Addressed This Visit   Unspecified hypothyroidism (Chronic)     Corrected with Levothyroxine 12.68mcg, last TSH 3.598 05/18/12--4.214 09/28/12--5.471 02/25/13-increased Levothyroxine to , f/u TSH scheduled.                     Relevant Medications      metoprolol succinate (TOPROL-XL) 25 MG 24 hr tablet      metoprolol tartrate (LOPRESSOR) 25 MG tablet   Unspecified chronic bronchitis     Maintained on Spiriva and Advair.         Stasis ulcer of right lower extremity - Primary     The lateral right middle lower leg: skin tear 02/16/13-developed into a stasis ulcer with yellow slough-will apply hydrogel and alginate dressing as wet to moist dressing to debride the yellow slough.     Pain in left hip     Chronic since the left hip fx surgical repair 06/2010-pain is managed with Tramadol 50mg  tid and 100mg  daily,Tylenol 650mg  tid, Celebrex 200mg  daily. W/c for mobility when out of bed. X-ray 01/13/13 showed no new acute fracture, subluxation, or dislocation. Also showed old healed fracture deformity at the left femoral head, neck, and intertrochanteric region with orthopedic rod, plate and screws spanning form superolateral to the femoral head to the proximal shaft. Potion of the orthopedic screw appear outside the confines of the femoral head. s/p Orthopedic consultation.             Neuropathy     Stable on Gabapentin 100mg  tid and Tylenol 650mg  tid.                         HTN (hypertension)      Takes Imdur  30mg  for CAD. Controlled on Metoprolol to 25mg  bid.                  Depression     takes Cymbalta 30mg  bid-stable.                      Dementia     Gradual decline, takes Namenda and lives in [place].                           CAD (coronary artery disease) of artery bypass graft     Stable on Imdur 30mg                           Review of Systems:  Review of Systems  Constitutional: Positive for malaise/fatigue. Negative for fever, chills, weight loss and diaphoresis.  HENT: Positive for hearing loss. Negative for congestion, ear pain and sore throat.   Eyes: Negative for pain, discharge and redness.  Respiratory: Positive for cough and shortness of breath. Negative for sputum production and wheezing.   Cardiovascular: Positive for leg swelling (trace in ankle, chronic, venous insufficiency) and PND. Negative for chest  pain, orthopnea and claudication.  Gastrointestinal: Negative for heartburn, nausea, vomiting, abdominal pain, diarrhea, constipation and blood in stool.  Genitourinary: Positive for frequency. Negative for dysuria, urgency and flank pain.  Musculoskeletal: Positive for back pain, joint pain (left hip pain. ), myalgias and neck pain. Negative for falls.  Skin: Negative for itching and rash.       BLE chronic venous insufficiency with mild erythema lower 1/3 of the BLE. A quarter size of the left stasis ulcer with yellow slough at the lateral of the middle of the right lower leg.   Neurological: Negative for dizziness, tingling, tremors, speech change, focal weakness, seizures, loss of consciousness and weakness. Sensory change: periphearl neuropathy.  Endo/Heme/Allergies: Negative for environmental allergies and polydipsia. Does not bruise/bleed easily.  Psychiatric/Behavioral: Positive for memory loss. Negative for depression and hallucinations. The patient is not nervous/anxious and does not have insomnia.       Past Medical History  Diagnosis Date  . Hypertension   . COPD (chronic obstructive pulmonary disease)   . CHF (congestive heart failure)   . Coronary artery disease   . Renal disorder   . GERD (gastroesophageal reflux disease)   . Osteoporosis   . Syncope   . Dementia   . Anemia   . Parkinson disease   . Neuropathy   . Thyroid disease   . Hyperlipidemia   . Anxiety   . Depression   . Chronic pain 11/01/2011   Past Surgical History  Procedure Laterality Date  . Fracture surgery    . Esophagogastroduodenoscopy  05/05/2012    Procedure: ESOPHAGOGASTRODUODENOSCOPY (EGD);  Surgeon: [name], MD;  Location: [facility] ENDOSCOPY;  Service: Endoscopy;  Laterality: N/A;  Pediatric upper endoscope   Social History:   reports that she has never smoked. She does not have any smokeless tobacco history on file. She reports that she does not drink alcohol or use illicit drugs.  Medications: Patient's Medications  New Prescriptions   No medications on file  Previous Medications   ACETAMINOPHEN (TYLENOL) 325 MG TABLET    Take 650 mg by mouth 3 (three) times daily.   ACYCLOVIR (ZOVIRAX) 200 MG CAPSULE    Take 200 mg by mouth daily.   CALCIUM-VITAMIN D (OSCAL WITH D) 500-200 MG-UNIT PER TABLET    Take 1 tablet by mouth daily.   CELECOXIB (CELEBREX) 200 MG CAPSULE    Take 200 mg by mouth daily.   DULOXETINE (CYMBALTA) 30 MG CAPSULE    Take 30 mg by mouth 2 (two) times daily.   FLUTICASONE-SALMETEROL (ADVAIR) 100-50 MCG/DOSE AEPB    Inhale 1 puff into the lungs every 12 (twelve) hours.   GABAPENTIN (NEURONTIN) 100 MG CAPSULE    Take 100 mg by mouth 3 (three) times daily.   HYDROXYPROPYL METHYLCELLULOSE (ISOPTO TEARS) 2.5 % OPHTHALMIC SOLUTION    Place 1 drop into both eyes every 4 (four) hours.   ISOSORBIDE MONONITRATE (IMDUR) 30 MG 24 HR TABLET    Take 30 mg by mouth daily.   LEVOTHYROXINE (SYNTHROID, LEVOTHROID) 25 MCG TABLET    Take 25 mcg by mouth daily.    MEMANTINE (NAMENDA) 5 MG  TABLET    Take 14 mg by mouth daily.    METOPROLOL SUCCINATE (TOPROL-XL) 25 MG 24 HR TABLET    Take 25 mg by mouth daily.   METOPROLOL TARTRATE (LOPRESSOR) 25 MG TABLET    Take 25 mg by mouth 2 (two) times daily.   OMEPRAZOLE (PRILOSEC) 20 MG CAPSULE  Take 2 capsules (40 mg total) by mouth 2 (two) times daily.   TIOTROPIUM (SPIRIVA) 18 MCG INHALATION CAPSULE    Place 18 mcg into inhaler and inhale daily.   TRAMADOL (ULTRAM) 50 MG TABLET    2 by mouth daily at NOON, 1 by mouth three times daily (6 am, 6 pm, 12 midnight)  Modified Medications   No medications on file  Discontinued Medications   No medications on file     Physical Exam: Physical Exam  Constitutional: She is oriented to person, place, and time. She appears well-developed and well-nourished.  HENT:  Head: Normocephalic and atraumatic.  Eyes: Conjunctivae and EOM are normal. Pupils are equal, round, and reactive to light.  Neck: Normal range of motion. No JVD present. No thyromegaly present.  Chronic decreased lateral ROM of the neck   Cardiovascular: Normal rate, regular rhythm and normal heart sounds.   No murmur heard. Pulmonary/Chest: Effort normal. She has rales.  Bibasilar moist rales and occasional wheezes  Abdominal: Soft. Bowel sounds are normal.  Musculoskeletal: She exhibits edema and tenderness.  Mainly pain in neck and left hip  Lymphadenopathy:    She has no cervical adenopathy.  Neurological: She is alert and oriented to person, place, and time. She has normal reflexes. No cranial nerve deficit. She exhibits normal muscle tone. Coordination normal.  Skin: Skin is warm and dry. No lesion and no rash noted. No erythema.  New skin lesion at bridge of her nose, new, scaly, superficial, non healing--11/17/12 Dermatology consultation: Bowen's disease-R leg. 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 BLE chronic venous insufficiency with mild erythema lower 1/3 of the BLE. A  quarter size of the left stasis ulcer with yellow slough at the lateral of the middle of the right lower leg.     Psychiatric: Cognition and memory are impaired. She exhibits abnormal recent memory.    Filed Vitals:   04/13/13 1619  BP: 151/78  Pulse: 61  Temp: 99 F (37.2 C)  TempSrc: Tympanic  Resp: 16      Labs reviewed: Basic Metabolic Panel:  Recent Labs  82/95/62 0500 05/06/12 1529 05/07/12 0508 07/13/12 09/28/12 02/25/13  NA 138 134* 135 141 141  --   K 3.9 3.8 3.5 4.4 3.8  --   CL 106 101 104  --   --   --   CO2 22 22 23   --   --   --   GLUCOSE 85 84 87  --   --   --   BUN 46* 47* 42* 24* 24*  --   CREATININE 1.46* 1.67* 1.54* 0.9 0.9  --   CALCIUM 8.5 8.5 8.6  --   --   --   PHOS  --  3.3  --   --   --   --   TSH  --   --  4.612*  --  4.21 5.47   CBC:  Recent Labs  05/05/12 0233  05/06/12 0500 05/07/12 0508 07/13/12 09/28/12 02/25/13  WBC 16.8*  --  12.7* 11.0* 11.7 9.1 10.0  HGB 14.8  < > 11.6* 11.7* 11.9* 11.6* 13.3  HCT 43.8  --  34.8* 34.7* 35* 36 41  MCV 94.8  --  94.1 94.3  --   --   --   PLT 310  --  252 251 404* 360 371  < > = values in this interval not displayed.   Assessment/Plan Stasis ulcer of right lower  extremity The lateral right middle lower leg: skin tear 02/16/13-developed into a stasis ulcer with yellow slough-will apply hydrogel and alginate dressing as wet to moist dressing to debride the yellow slough.   Pain in left hip Chronic since the left hip fx surgical repair 06/2010-pain is managed with Tramadol 50mg  tid and 100mg  daily,Tylenol 650mg  tid, Celebrex 200mg  daily. W/c for mobility when out of bed. X-ray 01/13/13 showed no new acute fracture, subluxation, or dislocation. Also showed old healed fracture deformity at the left femoral head, neck, and intertrochanteric region with orthopedic rod, plate and screws spanning form superolateral to the femoral head to the proximal shaft. Potion of the orthopedic screw appear outside the  confines of the femoral head. s/p Orthopedic consultation.           Unspecified hypothyroidism Corrected with Levothyroxine 12.66mcg, last TSH 3.598 05/18/12--4.214 09/28/12--5.471 02/25/13-increased Levothyroxine to , f/u TSH scheduled.                   CAD (coronary artery disease) of artery bypass graft Stable on Imdur 30mg                      Dementia Gradual decline, takes Namenda and lives in [place].                         HTN (hypertension)  Takes Imdur 30mg  for CAD. Controlled on Metoprolol to 25mg  bid.                Depression takes Cymbalta 30mg  bid-stable.                    Unspecified chronic bronchitis Maintained on Spiriva and Advair.       Neuropathy Stable on Gabapentin 100mg  tid and Tylenol 650mg  tid.                         Family/ Staff Communication: observe the patient.   Goals of Care: SNF  Labs/tests ordered: none

## 2013-04-13 NOTE — Assessment & Plan Note (Signed)
Chronic since the left hip fx surgical repair 06/2010-pain is managed with Tramadol 50mg tid and 100mg daily,Tylenol 650mg tid, Celebrex 200mg daily. W/c for mobility when out of bed. X-ray 01/13/13 showed no new acute fracture, subluxation, or dislocation. Also showed old healed fracture deformity at the left femoral head, neck, and intertrochanteric region with orthopedic rod, plate and screws spanning form superolateral to the femoral head to the proximal shaft. Potion of the orthopedic screw appear outside the confines of the femoral head. s/p Orthopedic consultation.            

## 2013-04-13 NOTE — Assessment & Plan Note (Signed)
takes Cymbalta 30mg bid-stable.    

## 2013-05-20 LAB — TSH: TSH: 2.74 u[IU]/mL (ref 0.41–5.90)

## 2013-05-21 ENCOUNTER — Encounter: Payer: Self-pay | Admitting: Nurse Practitioner

## 2013-05-21 ENCOUNTER — Non-Acute Institutional Stay (SKILLED_NURSING_FACILITY): Payer: Medicare Other | Admitting: Nurse Practitioner

## 2013-05-21 DIAGNOSIS — E039 Hypothyroidism, unspecified: Secondary | ICD-10-CM

## 2013-05-21 DIAGNOSIS — G589 Mononeuropathy, unspecified: Secondary | ICD-10-CM

## 2013-05-21 DIAGNOSIS — I25810 Atherosclerosis of coronary artery bypass graft(s) without angina pectoris: Secondary | ICD-10-CM

## 2013-05-21 DIAGNOSIS — M25552 Pain in left hip: Secondary | ICD-10-CM

## 2013-05-21 DIAGNOSIS — I83019 Varicose veins of right lower extremity with ulcer of unspecified site: Secondary | ICD-10-CM

## 2013-05-21 DIAGNOSIS — I10 Essential (primary) hypertension: Secondary | ICD-10-CM

## 2013-05-21 DIAGNOSIS — I83009 Varicose veins of unspecified lower extremity with ulcer of unspecified site: Secondary | ICD-10-CM

## 2013-05-21 DIAGNOSIS — F0390 Unspecified dementia without behavioral disturbance: Secondary | ICD-10-CM

## 2013-05-21 DIAGNOSIS — J42 Unspecified chronic bronchitis: Secondary | ICD-10-CM

## 2013-05-21 DIAGNOSIS — G629 Polyneuropathy, unspecified: Secondary | ICD-10-CM

## 2013-05-21 DIAGNOSIS — M25559 Pain in unspecified hip: Secondary | ICD-10-CM

## 2013-05-21 DIAGNOSIS — K219 Gastro-esophageal reflux disease without esophagitis: Secondary | ICD-10-CM

## 2013-05-21 DIAGNOSIS — F329 Major depressive disorder, single episode, unspecified: Secondary | ICD-10-CM

## 2013-05-21 DIAGNOSIS — L97909 Non-pressure chronic ulcer of unspecified part of unspecified lower leg with unspecified severity: Secondary | ICD-10-CM

## 2013-05-21 DIAGNOSIS — L97919 Non-pressure chronic ulcer of unspecified part of right lower leg with unspecified severity: Secondary | ICD-10-CM

## 2013-05-21 DIAGNOSIS — A6000 Herpesviral infection of urogenital system, unspecified: Secondary | ICD-10-CM

## 2013-05-21 DIAGNOSIS — F3289 Other specified depressive episodes: Secondary | ICD-10-CM

## 2013-05-21 DIAGNOSIS — F32A Depression, unspecified: Secondary | ICD-10-CM

## 2013-05-21 DIAGNOSIS — A6009 Herpesviral infection of other urogenital tract: Secondary | ICD-10-CM

## 2013-05-21 NOTE — Assessment & Plan Note (Signed)
Takes Imdur 30mg  for CAD. Elevated Bps 182-160/100-92, P 58--increased Metoprolol to 50mg  bid-will monitor Bp/P.

## 2013-05-21 NOTE — Assessment & Plan Note (Signed)
  tsH 5.471 02/25/13-increased Levothyroxine to 25mcg and f/u TSH 2.741 05/20/13.

## 2013-05-21 NOTE — Assessment & Plan Note (Signed)
Stable on Gabapentin 100mg  tid and Tylenol 650mg  tid.

## 2013-05-21 NOTE — Assessment & Plan Note (Signed)
takes Cymbalta 30mg bid-stable.    

## 2013-05-21 NOTE — Assessment & Plan Note (Signed)
Hx GI bleed, stable, Hgb11.9 07/14/11-11.6 09/29/39, off  Carafate 12/14/12 and  continue Omeprazole 40mg bid.   

## 2013-05-21 NOTE — Assessment & Plan Note (Signed)
The lateral right middle lower leg: skin tear 02/16/13-developed into a stasis ulcer with yellow slough-healing slowly.

## 2013-05-21 NOTE — Assessment & Plan Note (Signed)
Stable on Imdur 30mg 

## 2013-05-21 NOTE — Assessment & Plan Note (Signed)
Stable on Advair and spiriva

## 2013-05-21 NOTE — Progress Notes (Signed)
Patient ID: [name] [name], female   DOB: Sep 20, 1917, 80 y.o.   MRN: [id number]   Code Status: DNR  No Known Allergies  Chief Complaint  Patient presents with  . Medical Managment of Chronic Issues    blood pressure.   . Acute Visit    HPI: Patient is a 80 y.o. female seen in the SNF at [facility] today for evaluation of elevate blood pressure, RLE stasis ulcer, and other chronic medical conditions.  Problem List Items Addressed This Visit   Unspecified hypothyroidism (Chronic)      tsH 5.471 02/25/13-increased Levothyroxine to and f/u TSH 2.741 05/20/13.                       Relevant Medications      metoprolol (LOPRESSOR) 50 MG tablet   GERD (gastroesophageal reflux disease)     Hx GI bleed, stable, Hgb11.9 07/14/11-11.6 09/29/39, off  Carafate 12/14/12 and  continue Omeprazole 40mg  bid.                       Dementia     Gradual decline, takes Namenda and lives in [place].                             Neuropathy     Stable on Gabapentin 100mg  tid and Tylenol 650mg  tid.                           Depression     takes Cymbalta 30mg  bid-stable.                        CAD (coronary artery disease) of artery bypass graft     Stable on Imdur 30mg                          Herpes genitalis in women     Takes Acyclovir chronic for suppression therapy.         Unspecified chronic bronchitis     Stable on Advair and spiriva      HTN (hypertension) - Primary      Takes Imdur 30mg  for CAD. Elevated Bps 182-160/100-92, P 58--increased Metoprolol to 50mg  bid-will monitor Bp/P.                    Pain in left hip     Chronic since the left hip fx surgical repair 06/2010-pain is managed with Tramadol 50mg  tid and 100mg  daily,Tylenol 650mg  tid, Celebrex 200mg  daily. W/c for mobility when out of bed. X-ray 01/13/13 showed no new acute fracture,  subluxation, or dislocation. Also showed old healed fracture deformity at the left femoral head, neck, and intertrochanteric region with orthopedic rod, plate and screws spanning form superolateral to the femoral head to the proximal shaft. Potion of the orthopedic screw appear outside the confines of the femoral head. s/p Orthopedic consultation.               Stasis ulcer of right lower extremity     The lateral right middle lower leg: skin tear 02/16/13-developed into a stasis ulcer with yellow slough-healing slowly.          Review of Systems:  Review of Systems  Constitutional: Positive for malaise/fatigue. Negative for fever, chills, weight loss and diaphoresis.  HENT: Positive for  hearing loss. Negative for congestion, ear pain and sore throat.   Eyes: Negative for pain, discharge and redness.  Respiratory: Positive for cough and shortness of breath. Negative for sputum production and wheezing.   Cardiovascular: Positive for leg swelling (trace in ankle, chronic, venous insufficiency) and PND. Negative for chest pain, orthopnea and claudication.  Gastrointestinal: Negative for heartburn, nausea, vomiting, abdominal pain, diarrhea, constipation and blood in stool.  Genitourinary: Positive for frequency. Negative for dysuria, urgency and flank pain.  Musculoskeletal: Positive for back pain, joint pain (left hip pain. ), myalgias and neck pain. Negative for falls.  Skin: Negative for itching and rash.       BLE chronic venous insufficiency with mild erythema lower 1/3 of the BLE. A quarter size of the left stasis ulcer with yellow slough at the lateral of the middle of the right lower leg.   Neurological: Negative for dizziness, tingling, tremors, speech change, focal weakness, seizures, loss of consciousness and weakness. Sensory change: periphearl neuropathy.  Endo/Heme/Allergies: Negative for environmental allergies and polydipsia. Does not bruise/bleed easily.    Psychiatric/Behavioral: Positive for memory loss. Negative for depression and hallucinations. The patient is not nervous/anxious and does not have insomnia.      Past Medical History  Diagnosis Date  . Hypertension   . COPD (chronic obstructive pulmonary disease)   . CHF (congestive heart failure)   . Coronary artery disease   . Renal disorder   . GERD (gastroesophageal reflux disease)   . Osteoporosis   . Syncope   . Dementia   . Anemia   . Parkinson disease   . Neuropathy   . Thyroid disease   . Hyperlipidemia   . Anxiety   . Depression   . Chronic pain 11/01/2011   Past Surgical History  Procedure Laterality Date  . Fracture surgery    . Esophagogastroduodenoscopy  05/05/2012    Procedure: ESOPHAGOGASTRODUODENOSCOPY (EGD);  Surgeon: [name], MD;  Location: [facility] ENDOSCOPY;  Service: Endoscopy;  Laterality: N/A;  Pediatric upper endoscope   Social History:   reports that she has never smoked. She does not have any smokeless tobacco history on file. She reports that she does not drink alcohol or use illicit drugs.  Medications: Patient's Medications  New Prescriptions   No medications on file  Previous Medications   ACETAMINOPHEN (TYLENOL) 325 MG TABLET    Take 650 mg by mouth 3 (three) times daily.   ACYCLOVIR (ZOVIRAX) 200 MG CAPSULE    Take 200 mg by mouth daily.   CALCIUM-VITAMIN D (OSCAL WITH D) 500-200 MG-UNIT PER TABLET    Take 1 tablet by mouth daily.   CELECOXIB (CELEBREX) 200 MG CAPSULE    Take 200 mg by mouth daily.   DULOXETINE (CYMBALTA) 30 MG CAPSULE    Take 30 mg by mouth 2 (two) times daily.   FLUTICASONE-SALMETEROL (ADVAIR) 100-50 MCG/DOSE AEPB    Inhale 1 puff into the lungs every 12 (twelve) hours.   GABAPENTIN (NEURONTIN) 100 MG CAPSULE    Take 100 mg by mouth 3 (three) times daily.   HYDROXYPROPYL METHYLCELLULOSE (ISOPTO TEARS) 2.5 % OPHTHALMIC SOLUTION    Place 1 drop into both eyes every 4 (four) hours.   ISOSORBIDE MONONITRATE (IMDUR) 30  MG 24 HR TABLET    Take 30 mg by mouth daily.   LEVOTHYROXINE (SYNTHROID, LEVOTHROID) 25 MCG TABLET    Take 25 mcg by mouth daily.    MEMANTINE (NAMENDA) 5 MG TABLET    Take 14 mg  by mouth daily.    METOPROLOL (LOPRESSOR) 50 MG TABLET    Take 50 mg by mouth 2 (two) times daily.   OMEPRAZOLE (PRILOSEC) 20 MG CAPSULE    Take 2 capsules (40 mg total) by mouth 2 (two) times daily.   TIOTROPIUM (SPIRIVA) 18 MCG INHALATION CAPSULE    Place 18 mcg into inhaler and inhale daily.   TRAMADOL (ULTRAM) 50 MG TABLET    2 by mouth daily at NOON, 1 by mouth three times daily (6 am, 6 pm, 12 midnight)  Modified Medications   No medications on file  Discontinued Medications   METOPROLOL SUCCINATE (TOPROL-XL) 25 MG 24 HR TABLET    Take 25 mg by mouth daily.   METOPROLOL TARTRATE (LOPRESSOR) 25 MG TABLET    Take 25 mg by mouth 2 (two) times daily.     Physical Exam: Physical Exam  Constitutional: She is oriented to person, place, and time. She appears well-developed and well-nourished.  HENT:  Head: Normocephalic and atraumatic.  Eyes: Conjunctivae and EOM are normal. Pupils are equal, round, and reactive to light.  Neck: Normal range of motion. No JVD present. No thyromegaly present.  Chronic decreased lateral ROM of the neck   Cardiovascular: Normal rate, regular rhythm and normal heart sounds.   No murmur heard. Pulmonary/Chest: Effort normal. She has rales.  Bibasilar moist rales and occasional wheezes  Abdominal: Soft. Bowel sounds are normal.  Musculoskeletal: She exhibits edema and tenderness.  Mainly pain in neck and left hip  Lymphadenopathy:    She has no cervical adenopathy.  Neurological: She is alert and oriented to person, place, and time. She has normal reflexes. No cranial nerve deficit. She exhibits normal muscle tone. Coordination normal.  Skin: Skin is warm and dry. No lesion and no rash noted. No erythema.  New skin lesion at bridge of her nose, new, scaly, superficial, non  healing--11/17/12 Dermatology consultation: Bowen's disease-R leg. 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 BLE chronic venous insufficiency with mild erythema lower 1/3 of the BLE. A quarter size of the left stasis ulcer with yellow slough at the lateral of the middle of the right lower leg.     Psychiatric: Cognition and memory are impaired. She exhibits abnormal recent memory.    Filed Vitals:   05/21/13 1324  BP: 182/100  Pulse: 58  Temp: 97.1 F (36.2 C)  TempSrc: Tympanic  Resp: 18      Labs reviewed: Basic Metabolic Panel:  Recent Labs  40/98/1101/07/24 09/28/12 02/25/13 03/18/13  NA 141 141  --  141  K 4.4 3.8  --  4.1  BUN 24* 24*  --  20  CREATININE 0.9 0.9  --  1.0  TSH  --  4.21 5.47  --    CBC:  Recent Labs  07/13/12 09/28/12 02/25/13  WBC 11.7 9.1 10.0  HGB 11.9* 11.6* 13.3  HCT 35* 36 41  PLT 404* 360 371     Assessment/Plan HTN (hypertension)  Takes Imdur 30mg  for CAD. Elevated Bps 182-160/100-92, P 58--increased Metoprolol to 50mg  bid-will monitor Bp/P.                  Stasis ulcer of right lower extremity The lateral right middle lower leg: skin tear 02/16/13-developed into a stasis ulcer with yellow slough-healing slowly.     Pain in left hip Chronic since the left hip fx surgical repair 06/2010-pain is managed with Tramadol 50mg  tid and 100mg  daily,Tylenol 650mg   tid, Celebrex 200mg  daily. W/c for mobility when out of bed. X-ray 01/13/13 showed no new acute fracture, subluxation, or dislocation. Also showed old healed fracture deformity at the left femoral head, neck, and intertrochanteric region with orthopedic rod, plate and screws spanning form superolateral to the femoral head to the proximal shaft. Potion of the orthopedic screw appear outside the confines of the femoral head. s/p Orthopedic consultation.             Unspecified chronic bronchitis Stable on Advair and  spiriva    Herpes genitalis in women Takes Acyclovir chronic for suppression therapy.       CAD (coronary artery disease) of artery bypass graft Stable on Imdur 30mg                        Neuropathy Stable on Gabapentin 100mg  tid and Tylenol 650mg  tid.                         Dementia Gradual decline, takes Namenda and lives in [place].                           GERD (gastroesophageal reflux disease) Hx GI bleed, stable, Hgb11.9 07/14/11-11.6 09/29/39, off  Carafate 12/14/12 and  continue Omeprazole 40mg  bid.                     Unspecified hypothyroidism  tsH 5.471 02/25/13-increased Levothyroxine to and f/u TSH 2.741 05/20/13.                     Depression takes Cymbalta 30mg  bid-stable.                        Family/ Staff Communication: observe the patient.   Goals of Care: SNF  Labs/tests ordered: none

## 2013-05-21 NOTE — Assessment & Plan Note (Signed)
Takes Acyclovir chronic for suppression therapy.

## 2013-05-21 NOTE — Assessment & Plan Note (Signed)
Chronic since the left hip fx surgical repair 06/2010-pain is managed with Tramadol 50mg  tid and 100mg  daily,Tylenol 650mg  tid, Celebrex 200mg  daily. W/c for mobility when out of bed. X-ray 01/13/13 showed no new acute fracture, subluxation, or dislocation. Also showed old healed fracture deformity at the left femoral head, neck, and intertrochanteric region with orthopedic rod, plate and screws spanning form superolateral to the femoral head to the proximal shaft. Potion of the orthopedic screw appear outside the confines of the femoral head. s/p Orthopedic consultation.

## 2013-05-21 NOTE — Assessment & Plan Note (Signed)
Gradual decline, takes Namenda and lives in SNF.  

## 2013-06-04 ENCOUNTER — Encounter: Payer: Self-pay | Admitting: Nurse Practitioner

## 2013-06-04 ENCOUNTER — Non-Acute Institutional Stay (SKILLED_NURSING_FACILITY): Payer: Medicare Other | Admitting: Nurse Practitioner

## 2013-06-04 DIAGNOSIS — A6009 Herpesviral infection of other urogenital tract: Secondary | ICD-10-CM

## 2013-06-04 DIAGNOSIS — M25559 Pain in unspecified hip: Secondary | ICD-10-CM

## 2013-06-04 DIAGNOSIS — K2900 Acute gastritis without bleeding: Secondary | ICD-10-CM | POA: Insufficient documentation

## 2013-06-04 DIAGNOSIS — I25810 Atherosclerosis of coronary artery bypass graft(s) without angina pectoris: Secondary | ICD-10-CM

## 2013-06-04 DIAGNOSIS — A6000 Herpesviral infection of urogenital system, unspecified: Secondary | ICD-10-CM

## 2013-06-04 DIAGNOSIS — G589 Mononeuropathy, unspecified: Secondary | ICD-10-CM

## 2013-06-04 DIAGNOSIS — K219 Gastro-esophageal reflux disease without esophagitis: Secondary | ICD-10-CM

## 2013-06-04 DIAGNOSIS — E039 Hypothyroidism, unspecified: Secondary | ICD-10-CM

## 2013-06-04 DIAGNOSIS — F3289 Other specified depressive episodes: Secondary | ICD-10-CM

## 2013-06-04 DIAGNOSIS — I10 Essential (primary) hypertension: Secondary | ICD-10-CM

## 2013-06-04 DIAGNOSIS — J42 Unspecified chronic bronchitis: Secondary | ICD-10-CM

## 2013-06-04 DIAGNOSIS — F329 Major depressive disorder, single episode, unspecified: Secondary | ICD-10-CM

## 2013-06-04 DIAGNOSIS — F0390 Unspecified dementia without behavioral disturbance: Secondary | ICD-10-CM

## 2013-06-04 DIAGNOSIS — G629 Polyneuropathy, unspecified: Secondary | ICD-10-CM

## 2013-06-04 DIAGNOSIS — M25552 Pain in left hip: Secondary | ICD-10-CM

## 2013-06-04 DIAGNOSIS — F32A Depression, unspecified: Secondary | ICD-10-CM

## 2013-06-04 NOTE — Assessment & Plan Note (Signed)
Hx GI bleed, stable, Hgb11.9 07/14/11-11.6 09/29/39, off  Carafate 12/14/12 and  continue Omeprazole 40mg bid.   

## 2013-06-04 NOTE — Assessment & Plan Note (Signed)
takes Cymbalta 30mg bid-stable.    

## 2013-06-04 NOTE — Assessment & Plan Note (Signed)
N/V/D x 1 day, Phenergan prn helped, will continue to monitor the patient, update CBC and BMP

## 2013-06-04 NOTE — Assessment & Plan Note (Signed)
Takes Imdur 30mg  for CAD. Takes Metoprolol to 50mg  bid. Am 168/90, pm 136/84--will add Clonidine 0.1mg  po qhs, continue to monitor Bp

## 2013-06-04 NOTE — Assessment & Plan Note (Signed)
Stable on Gabapentin 100mg tid and Tylenol 650mg tid.   

## 2013-06-04 NOTE — Assessment & Plan Note (Signed)
Takes Acyclovir chronic for suppression therapy.

## 2013-06-04 NOTE — Progress Notes (Signed)
Patient ID: [name] [name], female   DOB: 02-Dec-1917, 80 y.o.   MRN: [id number]   Code Status: DNR  No Known Allergies  Chief Complaint  Patient presents with  . Medical Managment of Chronic Issues    N/V/D  . Acute Visit    HPI: Patient is a 80 y.o. female seen in the SNF at [facility] today for evaluation of elevate blood pressure, N/V/D x 1day, and other chronic medical conditions.  Problem List Items Addressed This Visit   CAD (coronary artery disease) of artery bypass graft     Stable on Imdur 30mg                            Dementia     Gradual decline, takes Namenda and lives in [place].                               Depression     takes Cymbalta 30mg  bid-stable.                          Gastritis, acute - Primary     N/V/D x 1 day, Phenergan prn helped, will continue to monitor the patient, update CBC and BMP    GERD (gastroesophageal reflux disease)     Hx GI bleed, stable, Hgb11.9 07/14/11-11.6 09/29/39, off  Carafate 12/14/12 and  continue Omeprazole 40mg  bid.                         Herpes genitalis in women     Takes Acyclovir chronic for suppression therapy.           HTN (hypertension)      Takes Imdur 30mg  for CAD. Takes Metoprolol to 50mg  bid. Am 168/90, pm 136/84--will add Clonidine 0.1mg  po qhs, continue to monitor Bp                     Neuropathy     Stable on Gabapentin 100mg  tid and Tylenol 650mg  tid.                             Pain in left hip     Chronic since the left hip fx surgical repair 06/2010-pain is managed with Tramadol 50mg  tid and 100mg  daily,Tylenol 650mg  tid, Celebrex 200mg  daily. W/c for mobility when out of bed. X-ray 01/13/13 showed no new acute fracture, subluxation, or dislocation. Also showed old healed fracture deformity at the left femoral head, neck, and intertrochanteric region with orthopedic rod,  plate and screws spanning form superolateral to the femoral head to the proximal shaft. Potion of the orthopedic screw appear outside the confines of the femoral head. s/p Orthopedic consultation.                 Unspecified chronic bronchitis     Maintained on Spiriva and Advair.           Unspecified hypothyroidism (Chronic)     Takes Levothyroxine daily, TSH 2.741 05/20/13                           Review of Systems:  Review of Systems  Constitutional: Positive for malaise/fatigue. Negative for fever, chills, weight loss and diaphoresis.  HENT: Positive for  hearing loss. Negative for congestion, ear pain and sore throat.   Eyes: Negative for pain, discharge and redness.  Respiratory: Positive for cough and shortness of breath. Negative for sputum production and wheezing.   Cardiovascular: Positive for leg swelling (trace in ankle, chronic, venous insufficiency) and PND. Negative for chest pain, orthopnea and claudication.  Gastrointestinal: Positive for nausea, vomiting and diarrhea. Negative for heartburn, abdominal pain, constipation and blood in stool.       Resolved with Phenergan prn.   Genitourinary: Positive for frequency. Negative for dysuria, urgency and flank pain.  Musculoskeletal: Positive for back pain, joint pain (left hip pain. ), myalgias and neck pain. Negative for falls.  Skin: Negative for itching and rash.       BLE chronic venous insufficiency with mild erythema lower 1/3 of the BLE. A quarter size of the left stasis ulcer with yellow slough at the lateral of the middle of the right lower leg.   Neurological: Negative for dizziness, tingling, tremors, speech change, focal weakness, seizures, loss of consciousness and weakness. Sensory change: periphearl neuropathy.  Endo/Heme/Allergies: Negative for environmental allergies and polydipsia. Does not bruise/bleed easily.  Psychiatric/Behavioral: Positive for memory loss.  Negative for depression and hallucinations. The patient is not nervous/anxious and does not have insomnia.      Past Medical History  Diagnosis Date  . Hypertension   . COPD (chronic obstructive pulmonary disease)   . CHF (congestive heart failure)   . Coronary artery disease   . Renal disorder   . GERD (gastroesophageal reflux disease)   . Osteoporosis   . Syncope   . Dementia   . Anemia   . Parkinson disease   . Neuropathy   . Thyroid disease   . Hyperlipidemia   . Anxiety   . Depression   . Chronic pain 11/01/2011   Past Surgical History  Procedure Laterality Date  . Fracture surgery    . Esophagogastroduodenoscopy  05/05/2012    Procedure: ESOPHAGOGASTRODUODENOSCOPY (EGD);  Surgeon: [name], MD;  Location: [facility] ENDOSCOPY;  Service: Endoscopy;  Laterality: N/A;  Pediatric upper endoscope   Social History:   reports that she has never smoked. She does not have any smokeless tobacco history on file. She reports that she does not drink alcohol or use illicit drugs.  Medications: Patient's Medications  New Prescriptions   No medications on file  Previous Medications   ACETAMINOPHEN (TYLENOL) 325 MG TABLET    Take 650 mg by mouth 3 (three) times daily.   ACYCLOVIR (ZOVIRAX) 200 MG CAPSULE    Take 200 mg by mouth daily.   CALCIUM-VITAMIN D (OSCAL WITH D) 500-200 MG-UNIT PER TABLET    Take 1 tablet by mouth daily.   CELECOXIB (CELEBREX) 200 MG CAPSULE    Take 200 mg by mouth daily.   DULOXETINE (CYMBALTA) 30 MG CAPSULE    Take 30 mg by mouth 2 (two) times daily.   FLUTICASONE-SALMETEROL (ADVAIR) 100-50 MCG/DOSE AEPB    Inhale 1 puff into the lungs every 12 (twelve) hours.   GABAPENTIN (NEURONTIN) 100 MG CAPSULE    Take 100 mg by mouth 3 (three) times daily.   HYDROXYPROPYL METHYLCELLULOSE (ISOPTO TEARS) 2.5 % OPHTHALMIC SOLUTION    Place 1 drop into both eyes every 4 (four) hours.   ISOSORBIDE MONONITRATE (IMDUR) 30 MG 24 HR TABLET    Take 30 mg by mouth daily.    LEVOTHYROXINE (SYNTHROID, LEVOTHROID) 25 MCG TABLET    Take 25 mcg by mouth daily.  MEMANTINE (NAMENDA) 5 MG TABLET    Take 14 mg by mouth daily.    METOPROLOL (LOPRESSOR) 50 MG TABLET    Take 50 mg by mouth 2 (two) times daily.   OMEPRAZOLE (PRILOSEC) 20 MG CAPSULE    Take 2 capsules (40 mg total) by mouth 2 (two) times daily.   TIOTROPIUM (SPIRIVA) 18 MCG INHALATION CAPSULE    Place 18 mcg into inhaler and inhale daily.   TRAMADOL (ULTRAM) 50 MG TABLET    2 by mouth daily at NOON, 1 by mouth three times daily (6 am, 6 pm, 12 midnight)  Modified Medications   No medications on file  Discontinued Medications   No medications on file     Physical Exam: Physical Exam  Constitutional: She is oriented to person, place, and time. She appears well-developed and well-nourished.  HENT:  Head: Normocephalic and atraumatic.  Eyes: Conjunctivae and EOM are normal. Pupils are equal, round, and reactive to light.  Neck: Normal range of motion. No JVD present. No thyromegaly present.  Chronic decreased lateral ROM of the neck   Cardiovascular: Normal rate, regular rhythm and normal heart sounds.   No murmur heard. Pulmonary/Chest: Effort normal. She has rales.  Bibasilar moist rales and occasional wheezes  Abdominal: Soft. Bowel sounds are normal.  Musculoskeletal: She exhibits edema and tenderness.  Mainly pain in neck and left hip  Lymphadenopathy:    She has no cervical adenopathy.  Neurological: She is alert and oriented to person, place, and time. She has normal reflexes. No cranial nerve deficit. She exhibits normal muscle tone. Coordination normal.  Skin: Skin is warm and dry. No lesion and no rash noted. No erythema.  New skin lesion at bridge of her nose, new, scaly, superficial, non healing--11/17/12 Dermatology consultation: Bowen's disease-R leg. 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 BLE chronic venous insufficiency with mild erythema lower  1/3 of the BLE. A quarter size of the left stasis ulcer with yellow slough at the lateral of the middle of the right lower leg.     Psychiatric: Cognition and memory are impaired. She exhibits abnormal recent memory.    Filed Vitals:   06/04/13 1136  BP: 168/90  Pulse: 86  Temp: 98.5 F (36.9 C)  TempSrc: Tympanic  Resp: 16      Labs reviewed: Basic Metabolic Panel:  Recent Labs  16/10/96 09/28/12 02/25/13 03/18/13 05/20/13  NA 141 141  --  141  --   K 4.4 3.8  --  4.1  --   BUN 24* 24*  --  20  --   CREATININE 0.9 0.9  --  1.0  --   TSH  --  4.21 5.47  --  2.74   CBC:  Recent Labs  07/13/12 09/28/12 02/25/13  WBC 11.7 9.1 10.0  HGB 11.9* 11.6* 13.3  HCT 35* 36 41  PLT 404* 360 371     Assessment/Plan Gastritis, acute N/V/D x 1 day, Phenergan prn helped, will continue to monitor the patient, update CBC and BMP  HTN (hypertension)  Takes Imdur 30mg  for CAD. Takes Metoprolol to 50mg  bid. Am 168/90, pm 136/84--will add Clonidine 0.1mg  po qhs, continue to monitor Bp                   Unspecified hypothyroidism Takes Levothyroxine daily, TSH 2.741 05/20/13  Unspecified chronic bronchitis Maintained on Spiriva and Advair.         Pain in left hip Chronic since the left hip fx surgical repair 06/2010-pain is managed with Tramadol 50mg  tid and 100mg  daily,Tylenol 650mg  tid, Celebrex 200mg  daily. W/c for mobility when out of bed. X-ray 01/13/13 showed no new acute fracture, subluxation, or dislocation. Also showed old healed fracture deformity at the left femoral head, neck, and intertrochanteric region with orthopedic rod, plate and screws spanning form superolateral to the femoral head to the proximal shaft. Potion of the orthopedic screw appear outside the confines of the femoral head. s/p Orthopedic consultation.               Neuropathy Stable on Gabapentin 100mg  tid and Tylenol 650mg  tid.                            Herpes genitalis in women Takes Acyclovir chronic for suppression therapy.         GERD (gastroesophageal reflux disease) Hx GI bleed, stable, Hgb11.9 07/14/11-11.6 09/29/39, off  Carafate 12/14/12 and  continue Omeprazole 40mg  bid.                       Depression takes Cymbalta 30mg  bid-stable.                        Dementia Gradual decline, [place] Nam[place]                             CAD (coronary artery disease) of artery bypass graft Stable on Imdur 30mg                            Family/ Staff Communication: observe the patient.   Goals of Care: SNF  Labs/tests ordered: CBC and BMP in am

## 2013-06-04 NOTE — Assessment & Plan Note (Signed)
Takes Levothyroxine 25mcg daily, TSH 2.741 05/20/13    

## 2013-06-04 NOTE — Assessment & Plan Note (Signed)
Chronic since the left hip fx surgical repair 06/2010-pain is managed with Tramadol 50mg  tid and 100mg  daily,Tylenol 650mg  tid, Celebrex 200mg  daily. W/c for mobility when out of bed. X-ray 01/13/13 showed no new acute fracture, subluxation, or dislocation. Also showed old healed fracture deformity at the left femoral head, neck, and intertrochanteric region with orthopedic rod, plate and screws spanning form superolateral to the femoral head to the proximal shaft. Potion of the orthopedic screw appear outside the confines of the femoral head. s/p Orthopedic consultation.

## 2013-06-04 NOTE — Assessment & Plan Note (Signed)
Gradual decline, takes Namenda and lives in SNF.  

## 2013-06-04 NOTE — Assessment & Plan Note (Signed)
Stable on Imdur 30mg                      

## 2013-06-04 NOTE — Assessment & Plan Note (Signed)
Maintained on Spiriva and Advair.

## 2013-06-07 LAB — BASIC METABOLIC PANEL
BUN: 31 mg/dL — AB (ref 4–21)
Creatinine: 0.9 mg/dL (ref 0.5–1.1)
Glucose: 92 mg/dL
POTASSIUM: 4.1 mmol/L (ref 3.4–5.3)
Sodium: 139 mmol/L (ref 137–147)

## 2013-06-07 LAB — CBC AND DIFFERENTIAL
HCT: 40 % (ref 36–46)
HEMOGLOBIN: 13.6 g/dL (ref 12.0–16.0)
PLATELETS: 267 10*3/uL (ref 150–399)
WBC: 11.5 10*3/mL

## 2013-06-08 ENCOUNTER — Non-Acute Institutional Stay (SKILLED_NURSING_FACILITY): Payer: Medicare Other | Admitting: Nurse Practitioner

## 2013-06-08 ENCOUNTER — Encounter: Payer: Self-pay | Admitting: Nurse Practitioner

## 2013-06-08 DIAGNOSIS — I25810 Atherosclerosis of coronary artery bypass graft(s) without angina pectoris: Secondary | ICD-10-CM

## 2013-06-08 DIAGNOSIS — F329 Major depressive disorder, single episode, unspecified: Secondary | ICD-10-CM

## 2013-06-08 DIAGNOSIS — J42 Unspecified chronic bronchitis: Secondary | ICD-10-CM

## 2013-06-08 DIAGNOSIS — K219 Gastro-esophageal reflux disease without esophagitis: Secondary | ICD-10-CM

## 2013-06-08 DIAGNOSIS — M25559 Pain in unspecified hip: Secondary | ICD-10-CM

## 2013-06-08 DIAGNOSIS — J189 Pneumonia, unspecified organism: Secondary | ICD-10-CM | POA: Insufficient documentation

## 2013-06-08 DIAGNOSIS — F0390 Unspecified dementia without behavioral disturbance: Secondary | ICD-10-CM

## 2013-06-08 DIAGNOSIS — F3289 Other specified depressive episodes: Secondary | ICD-10-CM

## 2013-06-08 DIAGNOSIS — G629 Polyneuropathy, unspecified: Secondary | ICD-10-CM

## 2013-06-08 DIAGNOSIS — E039 Hypothyroidism, unspecified: Secondary | ICD-10-CM

## 2013-06-08 DIAGNOSIS — M25552 Pain in left hip: Secondary | ICD-10-CM

## 2013-06-08 DIAGNOSIS — I10 Essential (primary) hypertension: Secondary | ICD-10-CM

## 2013-06-08 DIAGNOSIS — G589 Mononeuropathy, unspecified: Secondary | ICD-10-CM

## 2013-06-08 DIAGNOSIS — F32A Depression, unspecified: Secondary | ICD-10-CM

## 2013-06-08 DIAGNOSIS — K2900 Acute gastritis without bleeding: Secondary | ICD-10-CM

## 2013-06-08 NOTE — Assessment & Plan Note (Signed)
Hx GI bleed, stable, Hgb11.9 07/14/11-11.6 09/29/39, off  Carafate 12/14/12 and  continue Omeprazole 40mg bid.   

## 2013-06-08 NOTE — Assessment & Plan Note (Signed)
Stable on Gabapentin 100mg tid and Tylenol 650mg tid.   

## 2013-06-08 NOTE — Assessment & Plan Note (Signed)
takes Cymbalta 30mg bid-stable.    

## 2013-06-08 NOTE — Assessment & Plan Note (Signed)
Chronic since the left hip fx surgical repair 06/2010-pain is managed with Tramadol 50mg tid and 100mg daily,Tylenol 650mg tid, Celebrex 200mg daily. W/c for mobility when out of bed. X-ray 01/13/13 showed no new acute fracture, subluxation, or dislocation. Also showed old healed fracture deformity at the left femoral head, neck, and intertrochanteric region with orthopedic rod, plate and screws spanning form superolateral to the femoral head to the proximal shaft. Potion of the orthopedic screw appear outside the confines of the femoral head. s/p Orthopedic consultation.            

## 2013-06-08 NOTE — Assessment & Plan Note (Signed)
Chronic, takes  Spiriva and Advair for maintenance. Exacerbated with new onset of PNA

## 2013-06-08 NOTE — Assessment & Plan Note (Signed)
Takes Levothyroxine 25mcg daily, TSH 2.741 05/20/13    

## 2013-06-08 NOTE — Assessment & Plan Note (Signed)
Gradual decline, takes Namenda and lives in SNF.  

## 2013-06-08 NOTE — Assessment & Plan Note (Signed)
Takes Imdur 30mg  for CAD. Takes Metoprolol to 50mg  bid and  Clonidine 0.1mg  po qhs-controlled. 145/72 06/07/13

## 2013-06-08 NOTE — Progress Notes (Signed)
Patient ID: [name] [name], female   DOB: 1917-11-04, 80 y.o.   MRN: [id number]   Code Status: DNR  No Known Allergies  Chief Complaint  Patient presents with  . Medical Managment of Chronic Issues    PNA  . Acute Visit    HPI: Patient is a 80 y.o. female seen in the SNF at [facility] today for evaluation of PNA, Bp, and other chronic medical conditions.  Problem List Items Addressed This Visit   CAD (coronary artery disease) of artery bypass graft     Stable on Imdur 30mg                              CAP (community acquired pneumonia) - Primary     Low grade T, cough, thick yellow sputum production, wheezes--10 day course of Levaquin 500mg  daily started 06/07/13 along with FloraStor, continue Spiriva and Advair, adding Mucinex and Neb for 5 days.     Dementia     Gradual decline, takes Namenda and lives in [place].                                 Depression     takes Cymbalta 30mg  bid-stable.                            Gastritis, acute     N/V/D x 1 day, Phenergan prn helped, resolved.      GERD (gastroesophageal reflux disease)     Hx GI bleed, stable, Hgb11.9 07/14/11-11.6 09/29/39, off  Carafate 12/14/12 and  continue Omeprazole 40mg  bid.                           HTN (hypertension)      Takes Imdur 30mg  for CAD. Takes Metoprolol to 50mg  bid and  Clonidine 0.1mg  po qhs-controlled. 145/72 06/07/13                       Neuropathy     Stable on Gabapentin 100mg  tid and Tylenol 650mg  tid.                               Pain in left hip     Chronic since the left hip fx surgical repair 06/2010-pain is managed with Tramadol 50mg  tid and 100mg  daily,Tylenol 650mg  tid, Celebrex 200mg  daily. W/c for mobility when out of bed. X-ray 01/13/13 showed no new acute fracture, subluxation, or dislocation. Also showed old healed fracture deformity at the left  femoral head, neck, and intertrochanteric region with orthopedic rod, plate and screws spanning form superolateral to the femoral head to the proximal shaft. Potion of the orthopedic screw appear outside the confines of the femoral head. s/p Orthopedic consultation.                   Unspecified chronic bronchitis     Chronic, takes  Spiriva and Advair for maintenance. Exacerbated with new onset of PNA          Unspecified hypothyroidism (Chronic)     Takes Levothyroxine daily, TSH 2.741 05/20/13  Review of Systems:  Review of Systems  Constitutional: Positive for malaise/fatigue. Negative for fever, chills, weight loss and diaphoresis.  HENT: Positive for hearing loss. Negative for congestion, ear pain and sore throat.   Eyes: Negative for pain, discharge and redness.  Respiratory: Positive for cough, sputum production, shortness of breath and wheezing.        Thick yellow sputum  Cardiovascular: Positive for leg swelling (trace in ankle, chronic, venous insufficiency) and PND. Negative for chest pain, orthopnea and claudication.  Gastrointestinal: Positive for nausea, vomiting and diarrhea. Negative for heartburn, abdominal pain, constipation and blood in stool.       Resolved with Phenergan prn.   Genitourinary: Positive for frequency. Negative for dysuria, urgency and flank pain.  Musculoskeletal: Positive for back pain, joint pain (left hip pain. ), myalgias and neck pain. Negative for falls.  Skin: Negative for itching and rash.       BLE chronic venous insufficiency with mild erythema lower 1/3 of the BLE. A quarter size of the left stasis ulcer with yellow slough at the lateral of the middle of the right lower leg.   Neurological: Negative for dizziness, tingling, tremors, speech change, focal weakness, seizures, loss of consciousness and weakness. Sensory change: periphearl neuropathy.  Endo/Heme/Allergies:  Negative for environmental allergies and polydipsia. Does not bruise/bleed easily.  Psychiatric/Behavioral: Positive for memory loss. Negative for depression and hallucinations. The patient is not nervous/anxious and does not have insomnia.      Past Medical History  Diagnosis Date  . Hypertension   . COPD (chronic obstructive pulmonary disease)   . CHF (congestive heart failure)   . Coronary artery disease   . Renal disorder   . GERD (gastroesophageal reflux disease)   . Osteoporosis   . Syncope   . Dementia   . Anemia   . Parkinson disease   . Neuropathy   . Thyroid disease   . Hyperlipidemia   . Anxiety   . Depression   . Chronic pain 11/01/2011   Past Surgical History  Procedure Laterality Date  . Fracture surgery    . Esophagogastroduodenoscopy  05/05/2012    Procedure: ESOPHAGOGASTRODUODENOSCOPY (EGD);  Surgeon: [name], MD;  Location: [facility] ENDOSCOPY;  Service: Endoscopy;  Laterality: N/A;  Pediatric upper endoscope   Social History:   reports that she has never smoked. She does not have any smokeless tobacco history on file. She reports that she does not drink alcohol or use illicit drugs.  Medications: Patient's Medications  New Prescriptions   No medications on file  Previous Medications   ACETAMINOPHEN (TYLENOL) 325 MG TABLET    Take 650 mg by mouth 3 (three) times daily.   ACYCLOVIR (ZOVIRAX) 200 MG CAPSULE    Take 200 mg by mouth daily.   CALCIUM-VITAMIN D (OSCAL WITH D) 500-200 MG-UNIT PER TABLET    Take 1 tablet by mouth daily.   CELECOXIB (CELEBREX) 200 MG CAPSULE    Take 200 mg by mouth daily.   DULOXETINE (CYMBALTA) 30 MG CAPSULE    Take 30 mg by mouth 2 (two) times daily.   FLUTICASONE-SALMETEROL (ADVAIR) 100-50 MCG/DOSE AEPB    Inhale 1 puff into the lungs every 12 (twelve) hours.   GABAPENTIN (NEURONTIN) 100 MG CAPSULE    Take 100 mg by mouth 3 (three) times daily.   HYDROXYPROPYL METHYLCELLULOSE (ISOPTO TEARS) 2.5 % OPHTHALMIC SOLUTION    Place  1 drop into both eyes every 4 (four) hours.   ISOSORBIDE MONONITRATE (IMDUR) 30 MG  24 HR TABLET    Take 30 mg by mouth daily.   LEVOTHYROXINE (SYNTHROID, LEVOTHROID) 25 MCG TABLET    Take 25 mcg by mouth daily.    MEMANTINE (NAMENDA) 5 MG TABLET    Take 14 mg by mouth daily.    METOPROLOL (LOPRESSOR) 50 MG TABLET    Take 50 mg by mouth 2 (two) times daily.   OMEPRAZOLE (PRILOSEC) 20 MG CAPSULE    Take 2 capsules (40 mg total) by mouth 2 (two) times daily.   TIOTROPIUM (SPIRIVA) 18 MCG INHALATION CAPSULE    Place 18 mcg into inhaler and inhale daily.   TRAMADOL (ULTRAM) 50 MG TABLET    2 by mouth daily at NOON, 1 by mouth three times daily (6 am, 6 pm, 12 midnight)  Modified Medications   No medications on file  Discontinued Medications   No medications on file     Physical Exam: Physical Exam  Constitutional: She is oriented to person, place, and time. She appears well-developed and well-nourished.  Low grade T 99.1  HENT:  Head: Normocephalic and atraumatic.  Eyes: Conjunctivae and EOM are normal. Pupils are equal, round, and reactive to light.  Neck: Normal range of motion. No JVD present. No thyromegaly present.  Chronic decreased lateral ROM of the neck   Cardiovascular: Normal rate, regular rhythm and normal heart sounds.   No murmur heard. Pulmonary/Chest: Effort normal. She has wheezes. She has rales.  Bibasilar moist rales and occasional wheezes  Abdominal: Soft. Bowel sounds are normal.  Musculoskeletal: She exhibits edema and tenderness.  Mainly pain in neck and left hip  Lymphadenopathy:    She has no cervical adenopathy.  Neurological: She is alert and oriented to person, place, and time. She has normal reflexes. No cranial nerve deficit. She exhibits normal muscle tone. Coordination normal.  Skin: Skin is warm and dry. No lesion and no rash noted. No erythema.  New skin lesion at bridge of her nose, new, scaly, superficial, non healing--11/17/12 Dermatology consultation:  Bowen's disease-R leg. 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 BLE chronic venous insufficiency with mild erythema lower 1/3 of the BLE. A quarter size of the left stasis ulcer with yellow slough at the lateral of the middle of the right lower leg.     Psychiatric: Cognition and memory are impaired. She exhibits abnormal recent memory.    Filed Vitals:   06/08/13 0927  BP: 145/72  Pulse: 66  Temp: 99.1 F (37.3 C)  TempSrc: Tympanic  Resp: 20      Labs reviewed: Basic Metabolic Panel:  Recent Labs  16/02/9604/19/14 02/25/13 03/18/13 05/20/13 06/07/13  NA 141  --  141  --  139  K 3.8  --  4.1  --  4.1  BUN 24*  --  20  --  31*  CREATININE 0.9  --  1.0  --  0.9  TSH 4.21 5.47  --  2.74  --    CBC:  Recent Labs  09/28/12 02/25/13 06/07/13  WBC 9.1 10.0 11.5  HGB 11.6* 13.3 13.6  HCT 36 41 40  PLT 360 371 267    Past procedures:  06/07/13 CXR borderline cardiomegaly unchanged with new minimal pulmonary vascular congestion, patchy bibasilar atelectasis or pneumonitis appears new.   Assessment/Plan CAP (community acquired pneumonia) Low grade T, cough, thick yellow sputum production, wheezes--10 day course of Levaquin 500mg  daily started 06/07/13 along with FloraStor, continue Spiriva and Advair, adding Mucinex and Neb  for 5 days.   Dementia Gradual decline, takes Namenda and lives in [place].                               HTN (hypertension)  Takes Imdur 30mg  for CAD. Takes Metoprolol to 50mg  bid and  Clonidine 0.1mg  po qhs-controlled. 145/72 06/07/13                     Unspecified hypothyroidism Takes Levothyroxine daily, TSH 2.741 05/20/13                        Unspecified chronic bronchitis Chronic, takes  Spiriva and Advair for maintenance. Exacerbated with new onset of PNA        Pain in left hip Chronic since the left hip fx surgical repair  06/2010-pain is managed with Tramadol 50mg  tid and 100mg  daily,Tylenol 650mg  tid, Celebrex 200mg  daily. W/c for mobility when out of bed. X-ray 01/13/13 showed no new acute fracture, subluxation, or dislocation. Also showed old healed fracture deformity at the left femoral head, neck, and intertrochanteric region with orthopedic rod, plate and screws spanning form superolateral to the femoral head to the proximal shaft. Potion of the orthopedic screw appear outside the confines of the femoral head. s/p Orthopedic consultation.                 CAD (coronary artery disease) of artery bypass graft Stable on Imdur 30mg                            Depression takes Cymbalta 30mg  bid-stable.                          GERD (gastroesophageal reflux disease) Hx GI bleed, stable, Hgb11.9 07/14/11-11.6 09/29/39, off  Carafate 12/14/12 and  continue Omeprazole 40mg  bid.                         Neuropathy Stable on Gabapentin 100mg  tid and Tylenol 650mg  tid.                             Gastritis, acute N/V/D x 1 day, Phenergan prn helped, resolved.      Family/ Staff Communication: observe the patient.   Goals of Care: SNF  Labs/tests ordered: CXR done 06/07/13

## 2013-06-08 NOTE — Assessment & Plan Note (Signed)
N/V/D x 1 day, Phenergan prn helped, resolved.

## 2013-06-08 NOTE — Assessment & Plan Note (Addendum)
Low grade T, cough, thick yellow sputum production, wheezes--10 day course of Levaquin 500mg  daily started 06/07/13 along with FloraStor, continue Spiriva and Advair, adding Mucinex and Neb for 5 days.

## 2013-06-08 NOTE — Assessment & Plan Note (Signed)
Stable on Imdur 30mg 

## 2013-06-11 ENCOUNTER — Non-Acute Institutional Stay (SKILLED_NURSING_FACILITY): Payer: Medicare Other | Admitting: Nurse Practitioner

## 2013-06-11 ENCOUNTER — Encounter: Payer: Self-pay | Admitting: Nurse Practitioner

## 2013-06-11 DIAGNOSIS — K219 Gastro-esophageal reflux disease without esophagitis: Secondary | ICD-10-CM

## 2013-06-11 DIAGNOSIS — M25559 Pain in unspecified hip: Secondary | ICD-10-CM

## 2013-06-11 DIAGNOSIS — G629 Polyneuropathy, unspecified: Secondary | ICD-10-CM

## 2013-06-11 DIAGNOSIS — D649 Anemia, unspecified: Secondary | ICD-10-CM

## 2013-06-11 DIAGNOSIS — I10 Essential (primary) hypertension: Secondary | ICD-10-CM

## 2013-06-11 DIAGNOSIS — A6000 Herpesviral infection of urogenital system, unspecified: Secondary | ICD-10-CM

## 2013-06-11 DIAGNOSIS — F419 Anxiety disorder, unspecified: Secondary | ICD-10-CM

## 2013-06-11 DIAGNOSIS — M25552 Pain in left hip: Secondary | ICD-10-CM

## 2013-06-11 DIAGNOSIS — F0390 Unspecified dementia without behavioral disturbance: Secondary | ICD-10-CM

## 2013-06-11 DIAGNOSIS — F411 Generalized anxiety disorder: Secondary | ICD-10-CM

## 2013-06-11 DIAGNOSIS — G589 Mononeuropathy, unspecified: Secondary | ICD-10-CM

## 2013-06-11 DIAGNOSIS — R112 Nausea with vomiting, unspecified: Secondary | ICD-10-CM | POA: Insufficient documentation

## 2013-06-11 DIAGNOSIS — A6009 Herpesviral infection of other urogenital tract: Secondary | ICD-10-CM

## 2013-06-11 DIAGNOSIS — E039 Hypothyroidism, unspecified: Secondary | ICD-10-CM

## 2013-06-11 NOTE — Assessment & Plan Note (Signed)
Hx GI bleed, stable, Hgb11.9 07/14/11-11.6 09/29/39, off  Carafate 12/14/12 and  continue Omeprazole 40mg bid.   

## 2013-06-11 NOTE — Assessment & Plan Note (Signed)
Takes Acyclovir chronic for suppression therapy.     

## 2013-06-11 NOTE — Progress Notes (Signed)
Patient ID: [name] [name], female   DOB: 1917-05-15, 80 y.o.   MRN: [id number]   Code Status: DNR  No Known Allergies  Chief Complaint  Patient presents with  . Medical Managment of Chronic Issues    N/V  . Acute Visit    HPI: Patient is a 80 y.o. female [facility] today for evaluation of treated PNA, Bp, nausea/vomiting, and other chronic medical conditions.  Problem List Items Addressed This Visit   Anemia     Update CBC    Anxiety     takes Cymbalta 30mg  bid-stable.                              Dementia     Gradual decline, takes Namenda and [place]                                   GERD (gastroesophageal reflux disease)     Hx GI bleed, stable, Hgb11.9 07/14/11-11.6 09/29/39, off  Carafate 12/14/12 and  continue Omeprazole 40mg  bid.                             Herpes genitalis in women     Takes Acyclovir chronic for suppression therapy.             HTN (hypertension)      Takes Imdur 30mg  for CAD. Takes Metoprolol to 50mg  bid and  Clonidine 0.1mg  po qhs-controlled with occasionally elevated SBP which is asymptomatic to her.                          Nausea with vomiting - Primary     N/V without diarrhea reported-not today, denied abd pain or heart burn or indigestion, no tarry stool noted, likely ABT to be the etiology, will have Zofran 4mg  with meals while on ABT in setting of hx of GI bleed-observe the patient, check CBC and BMP in am. Continue Omeprazole bid.     Neuropathy     Stable on Gabapentin 100mg  tid and Tylenol 650mg  tid.                                 Pain in left hip     Chronic since the left hip fx surgical repair 06/2010-pain is managed with Tramadol 50mg  tid and 100mg  daily,Tylenol 650mg  tid, Celebrex 200mg  daily. W/c for mobility when out of bed. X-ray 01/13/13 showed no new acute fracture,  subluxation, or dislocation. Also showed old healed fracture deformity at the left femoral head, neck, and intertrochanteric region with orthopedic rod, plate and screws spanning form superolateral to the femoral head to the proximal shaft. Potion of the orthopedic screw appear outside the confines of the femoral head. s/p Orthopedic consultation.                     Unspecified hypothyroidism (Chronic)     Takes Levothyroxine 25mcg daily, TSH 2.741 05/20/13                               Review of Systems:  Review of Systems  Constitutional: Positive for malaise/fatigue.  Negative for fever, chills, weight loss and diaphoresis.  HENT: Positive for hearing loss. Negative for congestion, ear pain and sore throat.   Eyes: Negative for pain, discharge and redness.  Respiratory: Positive for cough, sputum production, shortness of breath and wheezing.        Thick yellow sputum  Cardiovascular: Positive for leg swelling (trace in ankle, chronic, venous insufficiency) and PND. Negative for chest pain, orthopnea and claudication.  Gastrointestinal: Positive for nausea, vomiting and diarrhea. Negative for heartburn, abdominal pain, constipation and blood in stool.       Resolved with Phenergan prn.   Genitourinary: Positive for frequency. Negative for dysuria, urgency and flank pain.  Musculoskeletal: Positive for back pain, joint pain (left hip pain. ), myalgias and neck pain. Negative for falls.  Skin: Negative for itching and rash.       BLE chronic venous insufficiency with mild erythema lower 1/3 of the BLE. A quarter size of the left stasis ulcer with yellow slough at the lateral of the middle of the right lower leg.   Neurological: Negative for dizziness, tingling, tremors, speech change, focal weakness, seizures, loss of consciousness and weakness. Sensory change: periphearl neuropathy.  Endo/Heme/Allergies: Negative for environmental allergies and  polydipsia. Does not bruise/bleed easily.  Psychiatric/Behavioral: Positive for memory loss. Negative for depression and hallucinations. The patient is not nervous/anxious and does not have insomnia.      Past Medical History  Diagnosis Date  . Hypertension   . COPD (chronic obstructive pulmonary disease)   . CHF (congestive heart failure)   . Coronary artery disease   . Renal disorder   . GERD (gastroesophageal reflux disease)   . Osteoporosis   . Syncope   . Dementia   . Anemia   . Parkinson disease   . Neuropathy   . Thyroid disease   . Hyperlipidemia   . Anxiety   . Depression   . Chronic pain 11/01/2011   Past Surgical History  Procedure Laterality Date  . Fracture surgery    . Esophagogastroduodenoscopy  05/05/2012    Procedure: ESOPHAGOGASTRODUODENOSCOPY (EGD);  Surgeon: [name], MD;  Location: [facility] ENDOSCOPY;  Service: Endoscopy;  Laterality: N/A;  Pediatric upper endoscope   Social History:   reports that she has never smoked. She does not have any smokeless tobacco history on file. She reports that she does not drink alcohol or use illicit drugs.  Medications: Patient's Medications  New Prescriptions   No medications on file  Previous Medications   ACETAMINOPHEN (TYLENOL) 325 MG TABLET    Take 650 mg by mouth 3 (three) times daily.   ACYCLOVIR (ZOVIRAX) 200 MG CAPSULE    Take 200 mg by mouth daily.   CALCIUM-VITAMIN D (OSCAL WITH D) 500-200 MG-UNIT PER TABLET    Take 1 tablet by mouth daily.   CELECOXIB (CELEBREX) 200 MG CAPSULE    Take 200 mg by mouth daily.   DULOXETINE (CYMBALTA) 30 MG CAPSULE    Take 30 mg by mouth 2 (two) times daily.   FLUTICASONE-SALMETEROL (ADVAIR) 100-50 MCG/DOSE AEPB    Inhale 1 puff into the lungs every 12 (twelve) hours.   GABAPENTIN (NEURONTIN) 100 MG CAPSULE    Take 100 mg by mouth 3 (three) times daily.   HYDROXYPROPYL METHYLCELLULOSE (ISOPTO TEARS) 2.5 % OPHTHALMIC SOLUTION    Place 1 drop into both eyes every 4 (four)  hours.   ISOSORBIDE MONONITRATE (IMDUR) 30 MG 24 HR TABLET    Take 30 mg by mouth daily.  LEVOTHYROXINE (SYNTHROID, LEVOTHROID) 25 MCG TABLET    Take 25 mcg by mouth daily.    MEMANTINE (NAMENDA) 5 MG TABLET    Take 14 mg by mouth daily.    METOPROLOL (LOPRESSOR) 50 MG TABLET    Take 50 mg by mouth 2 (two) times daily.   OMEPRAZOLE (PRILOSEC) 20 MG CAPSULE    Take 2 capsules (40 mg total) by mouth 2 (two) times daily.   TIOTROPIUM (SPIRIVA) 18 MCG INHALATION CAPSULE    Place 18 mcg into inhaler and inhale daily.   TRAMADOL (ULTRAM) 50 MG TABLET    2 by mouth daily at NOON, 1 by mouth three times daily (6 am, 6 pm, 12 midnight)  Modified Medications   No medications on file  Discontinued Medications   No medications on file     Physical Exam: Physical Exam  Constitutional: She is oriented to person, place, and time. She appears well-developed and well-nourished.  HENT:  Head: Normocephalic and atraumatic.  Eyes: Conjunctivae and EOM are normal. Pupils are equal, round, and reactive to light.  Neck: Normal range of motion. No JVD present. No thyromegaly present.  Chronic decreased lateral ROM of the neck   Cardiovascular: Normal rate, regular rhythm and normal heart sounds.   No murmur heard. Pulmonary/Chest: Effort normal. She has wheezes. She has rales.  Bibasilar moist rales and occasional wheezes  Abdominal: Soft. Bowel sounds are normal.  Musculoskeletal: She exhibits edema and tenderness.  Mainly pain in neck and left hip  Lymphadenopathy:    She has no cervical adenopathy.  Neurological: She is alert and oriented to person, place, and time. She has normal reflexes. No cranial nerve deficit. She exhibits normal muscle tone. Coordination normal.  Skin: Skin is warm and dry. No lesion and no rash noted. No erythema.  New skin lesion at bridge of her nose, new, scaly, superficial, non healing--11/17/12 Dermatology consultation: Bowen's disease-R leg. Probable BCC nose-hold tx for  now Bilateral lower legs chronic venous dermatitis. BLE chronic venous insufficiency with mild erythema lower 1/3 of the BLE. A quarter size of the left stasis ulcer with yellow slough at the lateral of the middle of the right lower leg.     Psychiatric: Cognition and memory are impaired. She exhibits abnormal recent memory.    Filed Vitals:   06/11/13 1202  BP: 168/86  Pulse: 72  Temp: 97.1 F (36.2 C)  TempSrc: Tympanic  Resp: 20      Labs reviewed: Basic Metabolic Panel:  Recent Labs  40/98/11 02/25/13 03/18/13 05/20/13 06/07/13  NA 141  --  141  --  139  K 3.8  --  4.1  --  4.1  BUN 24*  --  20  --  31*  CREATININE 0.9  --  1.0  --  0.9  TSH 4.21 5.47  --  2.74  --    CBC:  Recent Labs  09/28/12 02/25/13 06/07/13  WBC 9.1 10.0 11.5  HGB 11.6* 13.3 13.6  HCT 36 41 40  PLT 360 371 267    Past procedures:  06/07/13 CXR borderline cardiomegaly unchanged with new minimal pulmonary vascular congestion, patchy bibasilar atelectasis or pneumonitis appears new.   Assessment/Plan Nausea with vomiting N/V without diarrhea reported-not today, denied abd pain or heart burn or indigestion, no tarry stool noted, likely ABT to be the etiology, will have Zofran 4mg  with meals while on ABT in setting of hx of GI bleed-observe the patient, check CBC and BMP in am. Continue  Omeprazole bid.   Unspecified hypothyroidism Takes Levothyroxine daily, TSH 2.741 05/20/13                          GERD (gastroesophageal reflux disease) Hx GI bleed, stable, Hgb11.9 07/14/11-11.6 09/29/39, off  Carafate 12/14/12 and  continue Omeprazole 40mg  bid.                           Neuropathy Stable on Gabapentin 100mg  tid and Tylenol 650mg  tid.                               Pain in left hip Chronic since the left hip fx surgical repair 06/2010-pain is managed with Tramadol 50mg  tid and 100mg  daily,Tylenol 650mg  tid,  Celebrex 200mg  daily. W/c for mobility when out of bed. X-ray 01/13/13 showed no new acute fracture, subluxation, or dislocation. Also showed old healed fracture deformity at the left femoral head, neck, and intertrochanteric region with orthopedic rod, plate and screws spanning form superolateral to the femoral head to the proximal shaft. Potion of the orthopedic screw appear outside the confines of the femoral head. s/p Orthopedic consultation.                   HTN (hypertension)  Takes Imdur 30mg  for CAD. Takes Metoprolol to 50mg  bid and  Clonidine 0.1mg  po qhs-controlled with occasionally elevated SBP which is asymptomatic to her.                        Herpes genitalis in women Takes Acyclovir chronic for suppression therapy.           Anemia Update CBC  Anxiety takes Cymbalta 30mg  bid-stable.                            Dementia Gradual decline, takes Namenda and lives in [place].                                   Family/ Staff Communication: observe the patient.   Goals of Care: SNF  Labs/tests ordered: CXR done 06/07/13, CBC and CMP in am.

## 2013-06-11 NOTE — Assessment & Plan Note (Signed)
Takes Levothyroxine 25mcg daily, TSH 2.741 05/20/13    

## 2013-06-11 NOTE — Assessment & Plan Note (Signed)
Takes Imdur 30mg  for CAD. Takes Metoprolol to 50mg  bid and  Clonidine 0.1mg  po qhs-controlled with occasionally elevated SBP which is asymptomatic to her.

## 2013-06-11 NOTE — Assessment & Plan Note (Addendum)
N/V without diarrhea reported-not today, denied abd pain or heart burn or indigestion, no tarry stool noted, likely ABT to be the etiology, will have Zofran 4mg  with meals while on ABT in setting of hx of GI bleed-observe the patient, check CBC and BMP in am. Continue Omeprazole bid.

## 2013-06-11 NOTE — Assessment & Plan Note (Signed)
Update CBC. 

## 2013-06-11 NOTE — Assessment & Plan Note (Signed)
Gradual decline, takes Namenda and lives in SNF.  

## 2013-06-11 NOTE — Assessment & Plan Note (Signed)
Stable on Gabapentin 100mg  tid and Tylenol 650mg  tid.

## 2013-06-11 NOTE — Assessment & Plan Note (Signed)
takes Cymbalta 30mg bid-stable.    

## 2013-06-11 NOTE — Assessment & Plan Note (Signed)
Chronic since the left hip fx surgical repair 06/2010-pain is managed with Tramadol 50mg tid and 100mg daily,Tylenol 650mg tid, Celebrex 200mg daily. W/c for mobility when out of bed. X-ray 01/13/13 showed no new acute fracture, subluxation, or dislocation. Also showed old healed fracture deformity at the left femoral head, neck, and intertrochanteric region with orthopedic rod, plate and screws spanning form superolateral to the femoral head to the proximal shaft. Potion of the orthopedic screw appear outside the confines of the femoral head. s/p Orthopedic consultation.            

## 2013-06-12 LAB — BASIC METABOLIC PANEL
BUN: 29 mg/dL — AB (ref 4–21)
Creatinine: 1.2 mg/dL — AB (ref 0.5–1.1)
GLUCOSE: 129 mg/dL
Potassium: 4 mmol/L (ref 3.4–5.3)
Sodium: 139 mmol/L (ref 137–147)

## 2013-06-12 LAB — CBC AND DIFFERENTIAL
HEMATOCRIT: 42 % (ref 36–46)
HEMOGLOBIN: 14 g/dL (ref 12.0–16.0)
PLATELETS: 271 10*3/uL (ref 150–399)
WBC: 6.8 10^3/mL

## 2013-06-12 LAB — HEPATIC FUNCTION PANEL
ALK PHOS: 92 U/L (ref 25–125)
ALT: 25 U/L (ref 7–35)
AST: 27 U/L (ref 13–35)
BILIRUBIN, TOTAL: 0.3 mg/dL

## 2013-07-02 ENCOUNTER — Non-Acute Institutional Stay (SKILLED_NURSING_FACILITY): Payer: Medicare Other | Admitting: Nurse Practitioner

## 2013-07-02 ENCOUNTER — Encounter: Payer: Self-pay | Admitting: Nurse Practitioner

## 2013-07-02 DIAGNOSIS — J42 Unspecified chronic bronchitis: Secondary | ICD-10-CM

## 2013-07-02 DIAGNOSIS — G629 Polyneuropathy, unspecified: Secondary | ICD-10-CM

## 2013-07-02 DIAGNOSIS — D649 Anemia, unspecified: Secondary | ICD-10-CM

## 2013-07-02 DIAGNOSIS — I10 Essential (primary) hypertension: Secondary | ICD-10-CM

## 2013-07-02 DIAGNOSIS — G8929 Other chronic pain: Secondary | ICD-10-CM

## 2013-07-02 DIAGNOSIS — F419 Anxiety disorder, unspecified: Secondary | ICD-10-CM

## 2013-07-02 DIAGNOSIS — E039 Hypothyroidism, unspecified: Secondary | ICD-10-CM

## 2013-07-02 DIAGNOSIS — K219 Gastro-esophageal reflux disease without esophagitis: Secondary | ICD-10-CM

## 2013-07-02 DIAGNOSIS — R001 Bradycardia, unspecified: Secondary | ICD-10-CM

## 2013-07-02 DIAGNOSIS — F411 Generalized anxiety disorder: Secondary | ICD-10-CM

## 2013-07-02 DIAGNOSIS — I25810 Atherosclerosis of coronary artery bypass graft(s) without angina pectoris: Secondary | ICD-10-CM

## 2013-07-02 DIAGNOSIS — I498 Other specified cardiac arrhythmias: Secondary | ICD-10-CM

## 2013-07-02 DIAGNOSIS — G589 Mononeuropathy, unspecified: Secondary | ICD-10-CM

## 2013-07-02 DIAGNOSIS — F0390 Unspecified dementia without behavioral disturbance: Secondary | ICD-10-CM

## 2013-07-02 NOTE — Progress Notes (Signed)
Patient ID: Randolm Idol, female   DOB: 09/20/1917, 80 y.o.   MRN: [id number]   Code Status: DNR  No Known Allergies  Chief Complaint  Patient presents with  . Medical Managment of Chronic Issues    bradycardia.   . Acute Visit    HPI: Patient is a 80 y.o. female seen in the SNF at [facility] today for evaluation of bradycardia, blood pressure, and other chronic medical conditions.  Problem List Items Addressed This Visit   Unspecified hypothyroidism (Chronic)     Takes Levothyroxine daily, TSH 2.741 05/20/13     Unspecified chronic bronchitis     Stable on Advair and spiriva      Neuropathy     Stable on Gabapentin 100mg  tid and Tylenol 650mg  tid.      HTN (hypertension)     Takes Imdur 30mg  for CAD. Takes Metoprolol to 50mg  bid and  Clonidine 0.1mg  po qhs-controlled with occasionally elevated SBP which is asymptomatic to her. Dc Clonidine due to HR 40s. Start Losartan 50mg  daily, monitor Ap and BP, obtain EKG.      Relevant Medications      losartan (COZAAR) 50 MG tablet   GERD (gastroesophageal reflux disease)     Hx GI bleed, stable, Hgb11.9 07/14/11-11.6 09/29/39, off  Carafate 12/14/12 and  continue Omeprazole 40mg  bid.        Dementia     Gradual decline, takes Namenda and lives in [place].       Chronic pain (Chronic)     Mainly left hip: better with reduce Tramadol to qid and continue Celebrex.     Relevant Medications      traMADol (ULTRAM) 50 MG tablet   CAD (coronary artery disease) of artery bypass graft     Stable on Imdur 30mg . Last chest pain 06/30/13 relived with NTG and MaaLox.     Bradycardia - Primary     EKG to evaluate further. Dc Clonidine. May consider dc Metoprolol if EKG shows sinus bradycardia.     Anxiety     takes Cymbalta 30mg  bid-stable.    Anemia     Resolved, last Hgb 14.0 06/12/13       Review of Systems:  Review of Systems  Constitutional: Negative for fever, chills, weight loss, malaise/fatigue and diaphoresis.    HENT: Positive for hearing loss. Negative for congestion, ear pain and sore throat.   Eyes: Negative for pain, discharge and redness.  Respiratory: Positive for cough. Negative for sputum production, shortness of breath and wheezing.   Cardiovascular: Positive for leg swelling (trace in ankle, chronic, venous insufficiency) and PND. Negative for chest pain, orthopnea and claudication.       Reported chest pain x1 06/30/13. Bradycardia.   Gastrointestinal: Negative for heartburn, nausea, vomiting, abdominal pain, diarrhea, constipation and blood in stool.          Genitourinary: Positive for frequency. Negative for dysuria, urgency and flank pain.  Musculoskeletal: Positive for back pain, joint pain (left hip pain. ), myalgias and neck pain. Negative for falls.  Skin: Negative for itching and rash.       BLE chronic venous insufficiency with mild erythema lower 1/3 of the BLE. A quarter size of the left stasis ulcer with yellow slough at the lateral of the middle of the right lower leg.   Neurological: Negative for dizziness, tingling, tremors, speech change, focal weakness, seizures, loss of consciousness and weakness. Sensory change: periphearl neuropathy.  Endo/Heme/Allergies: Negative for environmental allergies and  polydipsia. Does not bruise/bleed easily.  Psychiatric/Behavioral: Positive for memory loss. Negative for depression and hallucinations. The patient is not nervous/anxious and does not have insomnia.      Past Medical History  Diagnosis Date  . Hypertension   . COPD (chronic obstructive pulmonary disease)   . CHF (congestive heart failure)   . Coronary artery disease   . Renal disorder   . GERD (gastroesophageal reflux disease)   . Osteoporosis   . Syncope   . Dementia   . Anemia   . Parkinson disease   . Neuropathy   . Thyroid disease   . Hyperlipidemia   . Anxiety   . Depression   . Chronic pain 11/01/2011   Past Surgical History  Procedure Laterality Date  .  Fracture surgery    . Esophagogastroduodenoscopy  05/05/2012    Procedure: ESOPHAGOGASTRODUODENOSCOPY (EGD);  Surgeon: [name], MD;  Location: [facility] ENDOSCOPY;  Service: Endoscopy;  Laterality: N/A;  Pediatric upper endoscope   Social History:   reports that she has never smoked. She does not have any smokeless tobacco history on file. She reports that she does not drink alcohol or use illicit drugs.  Medications: Patient's Medications  New Prescriptions   No medications on file  Previous Medications   ACETAMINOPHEN (TYLENOL) 325 MG TABLET    Take 650 mg by mouth 3 (three) times daily.   ACYCLOVIR (ZOVIRAX) 200 MG CAPSULE    Take 200 mg by mouth daily.   CALCIUM-VITAMIN D (OSCAL WITH D) 500-200 MG-UNIT PER TABLET    Take 1 tablet by mouth daily.   CELECOXIB (CELEBREX) 200 MG CAPSULE    Take 200 mg by mouth daily.   DULOXETINE (CYMBALTA) 30 MG CAPSULE    Take 30 mg by mouth 2 (two) times daily.   FLUTICASONE-SALMETEROL (ADVAIR) 100-50 MCG/DOSE AEPB    Inhale 1 puff into the lungs every 12 (twelve) hours.   GABAPENTIN (NEURONTIN) 100 MG CAPSULE    Take 100 mg by mouth 3 (three) times daily.   HYDROXYPROPYL METHYLCELLULOSE (ISOPTO TEARS) 2.5 % OPHTHALMIC SOLUTION    Place 1 drop into both eyes every 4 (four) hours.   ISOSORBIDE MONONITRATE (IMDUR) 30 MG 24 HR TABLET    Take 30 mg by mouth daily.   LEVOTHYROXINE (SYNTHROID, LEVOTHROID) 25 MCG TABLET    Take 25 mcg by mouth daily.    LOSARTAN (COZAAR) 50 MG TABLET    Take 50 mg by mouth daily.   MEMANTINE (NAMENDA) 5 MG TABLET    Take 14 mg by mouth daily.    METOPROLOL (LOPRESSOR) 50 MG TABLET    Take 50 mg by mouth 2 (two) times daily.   OMEPRAZOLE (PRILOSEC) 20 MG CAPSULE    Take 2 capsules (40 mg total) by mouth 2 (two) times daily.   TIOTROPIUM (SPIRIVA) 18 MCG INHALATION CAPSULE    Place 18 mcg into inhaler and inhale daily.  Modified Medications   Modified Medication Previous Medication   TRAMADOL (ULTRAM) 50 MG TABLET traMADol  (ULTRAM) 50 MG tablet      1 by mouth three times daily (6 am, 12pm, 6 pm, 12 midnight)    2 by mouth daily at NOON, 1 by mouth three times daily (6 am, 6 pm, 12 midnight)  Discontinued Medications   No medications on file     Physical Exam: Physical Exam  Constitutional: She is oriented to person, place, and time. She appears well-developed and well-nourished.  HENT:  Head: Normocephalic and atraumatic.  Eyes: Conjunctivae and EOM are normal. Pupils are equal, round, and reactive to light.  Neck: Normal range of motion. No JVD present. No thyromegaly present.  Chronic decreased lateral ROM of the neck   Cardiovascular: Regular rhythm and normal heart sounds.   No murmur heard. HR 52 upon my auscultation.   Pulmonary/Chest: Effort normal. She has no wheezes. She has no rales.  Abdominal: Soft. Bowel sounds are normal.  Musculoskeletal: She exhibits edema and tenderness.  Mainly pain in neck and left hip  Lymphadenopathy:    She has no cervical adenopathy.  Neurological: She is alert and oriented to person, place, and time. She has normal reflexes. No cranial nerve deficit. She exhibits normal muscle tone. Coordination normal.  Skin: Skin is warm and dry. No lesion and no rash noted. No erythema.  New skin lesion at bridge of her nose, new, scaly, superficial, non healing--11/17/12 Dermatology consultation: Bowen's disease-R leg. 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 BLE chronic venous insufficiency with mild erythema lower 1/3 of the BLE. A quarter size of the left stasis ulcer with yellow slough at the lateral of the middle of the right lower leg.     Psychiatric: Cognition and memory are impaired. She exhibits abnormal recent memory.    Filed Vitals:   07/02/13 1423  BP: 150/80  Pulse: 42  Temp: 96.9 F (36.1 C)  TempSrc: Tympanic  Resp: 16      Labs reviewed: Basic Metabolic Panel:  Recent Labs  16/02/9604/19/14 02/25/13 03/18/13  05/20/13 06/07/13 06/12/13  NA 141  --  141  --  139 139  K 3.8  --  4.1  --  4.1 4.0  BUN 24*  --  20  --  31* 29*  CREATININE 0.9  --  1.0  --  0.9 1.2*  TSH 4.21 5.47  --  2.74  --   --    CBC:  Recent Labs  02/25/13 06/07/13 06/12/13  WBC 10.0 11.5 6.8  HGB 13.3 13.6 14.0  HCT 41 40 42  PLT 371 267 271    Past procedures:  06/07/13 CXR borderline cardiomegaly unchanged with new minimal pulmonary vascular congestion, patchy bibasilar atelectasis or pneumonitis appears new.   Assessment/Plan Anemia Resolved, last Hgb 14.0 06/12/13  Anxiety takes Cymbalta 30mg  bid-stable.  CAD (coronary artery disease) of artery bypass graft Stable on Imdur 30mg . Last chest pain 06/30/13 relived with NTG and MaaLox.   Chronic pain Mainly left hip: better with reduce Tramadol to qid and continue Celebrex.   HTN (hypertension) Takes Imdur 30mg  for CAD. Takes Metoprolol to 50mg  bid and  Clonidine 0.1mg  po qhs-controlled with occasionally elevated SBP which is asymptomatic to her. Dc Clonidine due to HR 40s. Start Losartan 50mg  daily, monitor Ap and BP, obtain EKG.    Bradycardia EKG to evaluate further. Dc Clonidine. May consider dc Metoprolol if EKG shows sinus bradycardia.   Neuropathy Stable on Gabapentin 100mg  tid and Tylenol 650mg  tid.    Unspecified hypothyroidism Takes Levothyroxine 25mcg daily, TSH 2.741 05/20/13   Unspecified chronic bronchitis Stable on Advair and spiriva    Dementia Gradual decline, [place] Namenda [place]     GERD (gastroesophageal reflux disease) Hx GI bleed, stable, Hgb11.9 07/14/11-11.6 09/29/39, off  Carafate 12/14/12 and  continue Omeprazole 40mg  bid.        Family/ Staff Communication: observe the patient.   Goals of Care: SNF  Labs/tests ordered: BMP one week. EKG

## 2013-07-02 NOTE — Assessment & Plan Note (Signed)
Takes Imdur 30mg  for CAD. Takes Metoprolol to 50mg  bid and  Clonidine 0.1mg  po qhs-controlled with occasionally elevated SBP which is asymptomatic to her. Dc Clonidine due to HR 40s. Start Losartan 50mg  daily, monitor Ap and BP, obtain EKG.

## 2013-07-02 NOTE — Assessment & Plan Note (Signed)
Stable on Gabapentin 100mg  tid and Tylenol 650mg  tid.

## 2013-07-02 NOTE — Assessment & Plan Note (Signed)
Gradual decline, takes Namenda and lives in SNF.  

## 2013-07-02 NOTE — Assessment & Plan Note (Signed)
Stable on Advair and spiriva

## 2013-07-02 NOTE — Assessment & Plan Note (Signed)
takes Cymbalta 30mg bid-stable.    

## 2013-07-02 NOTE — Assessment & Plan Note (Signed)
EKG to evaluate further. Dc Clonidine. May consider dc Metoprolol if EKG shows sinus bradycardia.

## 2013-07-02 NOTE — Assessment & Plan Note (Signed)
Stable on Imdur 30mg. Last chest pain 06/30/13 relived with NTG and MaaLox.    

## 2013-07-02 NOTE — Assessment & Plan Note (Signed)
Takes Levothyroxine 25mcg daily, TSH 2.741 05/20/13    

## 2013-07-02 NOTE — Assessment & Plan Note (Signed)
Hx GI bleed, stable, Hgb11.9 07/14/11-11.6 09/29/39, off  Carafate 12/14/12 and  continue Omeprazole 40mg bid.   

## 2013-07-02 NOTE — Assessment & Plan Note (Signed)
Resolved, last Hgb 14.0 06/12/13   

## 2013-07-02 NOTE — Assessment & Plan Note (Signed)
Mainly left hip: better with reduce Tramadol to qid and continue Celebrex.

## 2013-07-08 LAB — BASIC METABOLIC PANEL
BUN: 24 mg/dL — AB (ref 4–21)
CREATININE: 1.3 mg/dL — AB (ref 0.5–1.1)
Glucose: 106 mg/dL
Potassium: 3.9 mmol/L (ref 3.4–5.3)
Sodium: 141 mmol/L (ref 137–147)

## 2013-07-23 ENCOUNTER — Non-Acute Institutional Stay (SKILLED_NURSING_FACILITY): Payer: Medicare Other | Admitting: Nurse Practitioner

## 2013-07-23 ENCOUNTER — Encounter: Payer: Self-pay | Admitting: Nurse Practitioner

## 2013-07-23 DIAGNOSIS — K219 Gastro-esophageal reflux disease without esophagitis: Secondary | ICD-10-CM

## 2013-07-23 DIAGNOSIS — I25810 Atherosclerosis of coronary artery bypass graft(s) without angina pectoris: Secondary | ICD-10-CM

## 2013-07-23 DIAGNOSIS — F32A Depression, unspecified: Secondary | ICD-10-CM

## 2013-07-23 DIAGNOSIS — I498 Other specified cardiac arrhythmias: Secondary | ICD-10-CM

## 2013-07-23 DIAGNOSIS — F329 Major depressive disorder, single episode, unspecified: Secondary | ICD-10-CM

## 2013-07-23 DIAGNOSIS — F0390 Unspecified dementia without behavioral disturbance: Secondary | ICD-10-CM

## 2013-07-23 DIAGNOSIS — I10 Essential (primary) hypertension: Secondary | ICD-10-CM

## 2013-07-23 DIAGNOSIS — M25559 Pain in unspecified hip: Secondary | ICD-10-CM

## 2013-07-23 DIAGNOSIS — G629 Polyneuropathy, unspecified: Secondary | ICD-10-CM

## 2013-07-23 DIAGNOSIS — J42 Unspecified chronic bronchitis: Secondary | ICD-10-CM

## 2013-07-23 DIAGNOSIS — M25552 Pain in left hip: Secondary | ICD-10-CM

## 2013-07-23 DIAGNOSIS — E039 Hypothyroidism, unspecified: Secondary | ICD-10-CM

## 2013-07-23 DIAGNOSIS — R001 Bradycardia, unspecified: Secondary | ICD-10-CM

## 2013-07-23 DIAGNOSIS — F3289 Other specified depressive episodes: Secondary | ICD-10-CM

## 2013-07-23 DIAGNOSIS — G589 Mononeuropathy, unspecified: Secondary | ICD-10-CM

## 2013-07-23 NOTE — Assessment & Plan Note (Signed)
EKG to evaluate further. off Clonidine. Decrease Metoprolol to 25mg  bid

## 2013-07-23 NOTE — Assessment & Plan Note (Signed)
Hx GI bleed, stable, Hgb11.9 07/14/11-11.6 09/29/39, off  Carafate 12/14/12 and  continue Omeprazole 40mg bid.   

## 2013-07-23 NOTE — Assessment & Plan Note (Signed)
takes Cymbalta 30mg bid-stable.    

## 2013-07-23 NOTE — Assessment & Plan Note (Signed)
Gradual decline, takes Namenda and lives in SNF.  

## 2013-07-23 NOTE — Assessment & Plan Note (Signed)
Chronic since the left hip fx surgical repair 06/2010-pain is managed with Tramadol 50mg  tid and 100mg  daily,Tylenol 650mg  tid, Celebrex 200mg  daily. W/c for mobility when out of bed. X-ray 01/13/13 showed no new acute fracture, subluxation, or dislocation. Also showed old healed fracture deformity at the left femoral head, neck, and intertrochanteric region with orthopedic rod, plate and screws spanning form superolateral to the femoral head to the proximal shaft. Potion of the orthopedic screw appear outside the confines of the femoral head. s/p Orthopedic consultation.

## 2013-07-23 NOTE — Assessment & Plan Note (Signed)
Stable on Gabapentin 100mg  tid and Tylenol 650mg  tid.

## 2013-07-23 NOTE — Assessment & Plan Note (Signed)
Takes Imdur 30mg  for CAD. Takes Metoprolol to 50mg  bid, Losartan 50mg  qd, off Clonidine 0.1mg  po qhs-controlled with occasionally elevated SBP which is asymptomatic to her. Staff reported HR 40s. Increase Losartan to 50mg  bid-f/u BMP for better blood pressure control. Decrease Metoprolol to 25mg  bid due to bradycardia. Monitor Ap and BP, obtain EKG.

## 2013-07-23 NOTE — Assessment & Plan Note (Signed)
Stable on Imdur 30mg. Last chest pain 06/30/13 relived with NTG and MaaLox.    

## 2013-07-23 NOTE — Progress Notes (Signed)
Patient ID: [name] IdolIris O [name], female   DOB: [birth year], 80 y.o.   MRN: [id number]   Code Status: DNR  No Known Allergies  Chief Complaint  Patient presents with  . Medical Managment of Chronic Issues  . Acute Visit    bradycardia.   . Dementia    HPI: Patient is a 80 y.o. female seen in the SNF at [facility] today for evaluation of bradycardia, blood pressure, and other chronic medical conditions.  Problem List Items Addressed This Visit   Unspecified hypothyroidism - Primary (Chronic)     Takes Levothyroxine 25mcg daily, TSH 2.741 05/20/13     Relevant Medications      metoprolol tartrate (LOPRESSOR) 25 MG tablet   Unspecified chronic bronchitis     Maintained on Spiriva and Advair. Chronic cough.        Pain in left hip     Chronic since the left hip fx surgical repair 06/2010-pain is managed with Tramadol 50mg  tid and 100mg  daily,Tylenol 650mg  tid, Celebrex 200mg  daily. W/c for mobility when out of bed. X-ray 01/13/13 showed no new acute fracture, subluxation, or dislocation. Also showed old healed fracture deformity at the left femoral head, neck, and intertrochanteric region with orthopedic rod, plate and screws spanning form superolateral to the femoral head to the proximal shaft. Potion of the orthopedic screw appear outside the confines of the femoral head. s/p Orthopedic consultation.         Neuropathy     Stable on Gabapentin 100mg  tid and Tylenol 650mg  tid.       HTN (hypertension)     Takes Imdur 30mg  for CAD. Takes Metoprolol to 50mg  bid, Losartan 50mg  qd, off Clonidine 0.1mg  po qhs-controlled with occasionally elevated SBP which is asymptomatic to her. Staff reported HR 40s. Increase Losartan to 50mg  bid-f/u BMP for better blood pressure control. Decrease Metoprolol to 25mg  bid due to bradycardia. Monitor Ap and BP, obtain EKG.       Relevant Medications      losartan (COZAAR) 50 MG tablet   GERD (gastroesophageal reflux disease)     Hx GI bleed, stable,  Hgb11.9 07/14/11-11.6 09/29/39, off  Carafate 12/14/12 and  continue Omeprazole 40mg  bid.       Depression     takes Cymbalta 30mg  bid-stable.     Dementia     Gradual decline, takes Namenda [place]       CAD (coronary artery disease) of artery bypass graft     Stable on Imdur 30mg . Last chest pain 06/30/13 relived with NTG and MaaLox.      Bradycardia     EKG to evaluate further. off Clonidine. Decrease Metoprolol to 25mg  bid        Review of Systems:  Review of Systems  Constitutional: Negative for fever, chills, weight loss, malaise/fatigue and diaphoresis.  HENT: Positive for hearing loss. Negative for congestion, ear pain and sore throat.   Eyes: Negative for pain, discharge and redness.  Respiratory: Positive for cough. Negative for sputum production, shortness of breath and wheezing.   Cardiovascular: Positive for leg swelling (trace in ankle, chronic, venous insufficiency) and PND. Negative for chest pain, orthopnea and claudication.       Reported chest pain x1 06/30/13. Bradycardia.   Gastrointestinal: Negative for heartburn, nausea, vomiting, abdominal pain, diarrhea, constipation and blood in stool.          Genitourinary: Positive for frequency. Negative for dysuria, urgency and flank pain.  Musculoskeletal: Positive for back pain,  joint pain (left hip pain. ), myalgias and neck pain. Negative for falls.  Skin: Negative for itching and rash.       BLE chronic venous insufficiency with mild erythema lower 1/3 of the BLE. A quarter size of the left stasis ulcer with yellow slough at the lateral of the middle of the right lower leg.   Neurological: Negative for dizziness, tingling, tremors, speech change, focal weakness, seizures, loss of consciousness and weakness. Sensory change: periphearl neuropathy.  Endo/Heme/Allergies: Negative for environmental allergies and polydipsia. Does not bruise/bleed easily.  Psychiatric/Behavioral: Positive for memory loss.  Negative for depression and hallucinations. The patient is not nervous/anxious and does not have insomnia.      Past Medical History  Diagnosis Date  . Hypertension   . COPD (chronic obstructive pulmonary disease)   . CHF (congestive heart failure)   . Coronary artery disease   . Renal disorder   . GERD (gastroesophageal reflux disease)   . Osteoporosis   . Syncope   . Dementia   . Anemia   . Parkinson disease   . Neuropathy   . Thyroid disease   . Hyperlipidemia   . Anxiety   . Depression   . Chronic pain 11/01/2011   Past Surgical History  Procedure Laterality Date  . Fracture surgery    . Esophagogastroduodenoscopy  05/05/2012    Procedure: ESOPHAGOGASTRODUODENOSCOPY (EGD);  Surgeon: [name], MD;  Location: [facility] ENDOSCOPY;  Service: Endoscopy;  Laterality: N/A;  Pediatric upper endoscope   Social History:   reports that she has never smoked. She does not have any smokeless tobacco history on file. She reports that she does not drink alcohol or use illicit drugs.  Medications: Patient's Medications  New Prescriptions   No medications on file  Previous Medications   ACETAMINOPHEN (TYLENOL) 325 MG TABLET    Take 650 mg by mouth 3 (three) times daily.   ACYCLOVIR (ZOVIRAX) 200 MG CAPSULE    Take 200 mg by mouth daily.   CALCIUM-VITAMIN D (OSCAL WITH D) 500-200 MG-UNIT PER TABLET    Take 1 tablet by mouth daily.   CELECOXIB (CELEBREX) 200 MG CAPSULE    Take 200 mg by mouth daily.   DULOXETINE (CYMBALTA) 30 MG CAPSULE    Take 30 mg by mouth 2 (two) times daily.   FLUTICASONE-SALMETEROL (ADVAIR) 100-50 MCG/DOSE AEPB    Inhale 1 puff into the lungs every 12 (twelve) hours.   GABAPENTIN (NEURONTIN) 100 MG CAPSULE    Take 100 mg by mouth 3 (three) times daily.   HYDROXYPROPYL METHYLCELLULOSE (ISOPTO TEARS) 2.5 % OPHTHALMIC SOLUTION    Place 1 drop into both eyes every 4 (four) hours.   ISOSORBIDE MONONITRATE (IMDUR) 30 MG 24 HR TABLET    Take 30 mg by mouth daily.    LEVOTHYROXINE (SYNTHROID, LEVOTHROID) 25 MCG TABLET    Take 25 mcg by mouth daily.    LOSARTAN (COZAAR) 50 MG TABLET    Take 50 mg by mouth 2 (two) times daily.   MEMANTINE (NAMENDA) 5 MG TABLET    Take 14 mg by mouth daily.    METOPROLOL TARTRATE (LOPRESSOR) 25 MG TABLET    Take 25 mg by mouth 2 (two) times daily.   OMEPRAZOLE (PRILOSEC) 20 MG CAPSULE    Take 2 capsules (40 mg total) by mouth 2 (two) times daily.   TIOTROPIUM (SPIRIVA) 18 MCG INHALATION CAPSULE    Place 18 mcg into inhaler and inhale daily.   TRAMADOL (ULTRAM) 50 MG  TABLET    1 by mouth three times daily (6 am, 12pm, 6 pm, 12 midnight)  Modified Medications   No medications on file  Discontinued Medications   LOSARTAN (COZAAR) 100 MG TABLET    Take 100 mg by mouth daily.   LOSARTAN (COZAAR) 50 MG TABLET    Take 50 mg by mouth daily.   METOPROLOL (LOPRESSOR) 50 MG TABLET    Take 50 mg by mouth 2 (two) times daily.     Physical Exam: Physical Exam  Constitutional: She is oriented to person, place, and time. She appears well-developed and well-nourished.  HENT:  Head: Normocephalic and atraumatic.  Eyes: Conjunctivae and EOM are normal. Pupils are equal, round, and reactive to light.  Neck: Normal range of motion. No JVD present. No thyromegaly present.  Chronic decreased lateral ROM of the neck   Cardiovascular: Regular rhythm and normal heart sounds.   No murmur heard. HR 52 upon my auscultation.   Pulmonary/Chest: Effort normal. She has no wheezes. She has no rales.  Abdominal: Soft. Bowel sounds are normal.  Musculoskeletal: She exhibits edema and tenderness.  Mainly pain in neck and left hip  Lymphadenopathy:    She has no cervical adenopathy.  Neurological: She is alert and oriented to person, place, and time. She has normal reflexes. No cranial nerve deficit. She exhibits normal muscle tone. Coordination normal.  Skin: Skin is warm and dry. No lesion and no rash noted. No erythema.  New skin lesion at bridge  of her nose, new, scaly, superficial, non healing--11/17/12 Dermatology consultation: Bowen's disease-R leg. 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 BLE chronic venous insufficiency with mild erythema lower 1/3 of the BLE. A quarter size of the left stasis ulcer with yellow slough at the lateral of the middle of the right lower leg.     Psychiatric: Cognition and memory are impaired. She exhibits abnormal recent memory.    Filed Vitals:   07/23/13 1411  BP: 164/90  Pulse: 48  Temp: 98.1 F (36.7 C)  TempSrc: Tympanic  Resp: 16      Labs reviewed: Basic Metabolic Panel:  Recent Labs  60/45/40 02/25/13  05/20/13 06/07/13 06/12/13 07/08/13  NA 141  --   < >  --  139 139 141  K 3.8  --   < >  --  4.1 4.0 3.9  BUN 24*  --   < >  --  31* 29* 24*  CREATININE 0.9  --   < >  --  0.9 1.2* 1.3*  TSH 4.21 5.47  --  2.74  --   --   --   < > = values in this interval not displayed. CBC:  Recent Labs  02/25/13 06/07/13 06/12/13  WBC 10.0 11.5 6.8  HGB 13.3 13.6 14.0  HCT 41 40 42  PLT 371 267 271    Past procedures:  06/07/13 CXR borderline cardiomegaly unchanged with new minimal pulmonary vascular congestion, patchy bibasilar atelectasis or pneumonitis appears new.   Assessment/Plan Unspecified hypothyroidism Takes Levothyroxine daily, TSH 2.741 05/20/13   Unspecified chronic bronchitis Maintained on Spiriva and Advair. Chronic cough.      Pain in left hip Chronic since the left hip fx surgical repair 06/2010-pain is managed with Tramadol 50mg  tid and 100mg  daily,Tylenol 650mg  tid, Celebrex 200mg  daily. W/c for mobility when out of bed. X-ray 01/13/13 showed no new acute fracture, subluxation, or dislocation. Also showed old healed fracture deformity at the  left femoral head, neck, and intertrochanteric region with orthopedic rod, plate and screws spanning form superolateral to the femoral head to the proximal shaft. Potion of the  orthopedic screw appear outside the confines of the femoral head. s/p Orthopedic consultation.       Neuropathy Stable on Gabapentin 100mg  tid and Tylenol 650mg  tid.     HTN (hypertension) Takes Imdur 30mg  for CAD. Takes Metoprolol to 50mg  bid, Losartan 50mg  qd, off Clonidine 0.1mg  po qhs-controlled with occasionally elevated SBP which is asymptomatic to her. Staff reported HR 40s. Increase Losartan to 50mg  bid-f/u BMP for better blood pressure control. Decrease Metoprolol to 25mg  bid due to bradycardia. Monitor Ap and BP, obtain EKG.     GERD (gastroesophageal reflux disease) Hx GI bleed, stable, Hgb11.9 07/14/11-11.6 09/29/39, off  Carafate 12/14/12 and  continue Omeprazole 40mg  bid.     Depression takes Cymbalta 30mg  bid-stable.   Dementia Gradual decline, takes Namenda and lives in [place].     CAD (coronary artery disease) of artery bypass graft Stable on Imdur 30mg . Last chest pain 06/30/13 relived with NTG and MaaLox.    Bradycardia EKG to evaluate further. off Clonidine. Decrease Metoprolol to 25mg  bid     Family/ Staff Communication: observe the patient.   Goals of Care: SNF  Labs/tests ordered: BMP and EKG

## 2013-07-23 NOTE — Assessment & Plan Note (Signed)
Maintained on Spiriva and Advair. Chronic cough 

## 2013-07-23 NOTE — Assessment & Plan Note (Signed)
Takes Levothyroxine 25mcg daily, TSH 2.741 05/20/13    

## 2013-07-26 LAB — BASIC METABOLIC PANEL
BUN: 24 mg/dL — AB (ref 4–21)
CREATININE: 1 mg/dL (ref 0.5–1.1)
Glucose: 82 mg/dL
Potassium: 4.2 mmol/L (ref 3.4–5.3)
SODIUM: 141 mmol/L (ref 137–147)

## 2013-07-27 ENCOUNTER — Non-Acute Institutional Stay (SKILLED_NURSING_FACILITY): Payer: Medicare Other | Admitting: Nurse Practitioner

## 2013-07-27 ENCOUNTER — Encounter: Payer: Self-pay | Admitting: Nurse Practitioner

## 2013-07-27 DIAGNOSIS — G629 Polyneuropathy, unspecified: Secondary | ICD-10-CM

## 2013-07-27 DIAGNOSIS — I25810 Atherosclerosis of coronary artery bypass graft(s) without angina pectoris: Secondary | ICD-10-CM

## 2013-07-27 DIAGNOSIS — R001 Bradycardia, unspecified: Secondary | ICD-10-CM

## 2013-07-27 DIAGNOSIS — E039 Hypothyroidism, unspecified: Secondary | ICD-10-CM

## 2013-07-27 DIAGNOSIS — F411 Generalized anxiety disorder: Secondary | ICD-10-CM

## 2013-07-27 DIAGNOSIS — G589 Mononeuropathy, unspecified: Secondary | ICD-10-CM

## 2013-07-27 DIAGNOSIS — F0390 Unspecified dementia without behavioral disturbance: Secondary | ICD-10-CM

## 2013-07-27 DIAGNOSIS — D649 Anemia, unspecified: Secondary | ICD-10-CM

## 2013-07-27 DIAGNOSIS — I498 Other specified cardiac arrhythmias: Secondary | ICD-10-CM

## 2013-07-27 DIAGNOSIS — N390 Urinary tract infection, site not specified: Secondary | ICD-10-CM

## 2013-07-27 DIAGNOSIS — K219 Gastro-esophageal reflux disease without esophagitis: Secondary | ICD-10-CM

## 2013-07-27 DIAGNOSIS — G8929 Other chronic pain: Secondary | ICD-10-CM

## 2013-07-27 DIAGNOSIS — J42 Unspecified chronic bronchitis: Secondary | ICD-10-CM

## 2013-07-27 DIAGNOSIS — F419 Anxiety disorder, unspecified: Secondary | ICD-10-CM

## 2013-07-27 DIAGNOSIS — I10 Essential (primary) hypertension: Secondary | ICD-10-CM

## 2013-07-27 DIAGNOSIS — B370 Candidal stomatitis: Secondary | ICD-10-CM

## 2013-07-27 NOTE — Assessment & Plan Note (Signed)
Chronic since the left hip fx surgical repair 06/2010-pain is managed with Tramadol 50mg tid and 100mg daily,Tylenol 650mg tid, Celebrex 200mg daily. W/c for mobility when out of bed. X-ray 01/13/13 showed no new acute fracture, subluxation, or dislocation. Also showed old healed fracture deformity at the left femoral head, neck, and intertrochanteric region with orthopedic rod, plate and screws spanning form superolateral to the femoral head to the proximal shaft. Potion of the orthopedic screw appear outside the confines of the femoral head. s/p Orthopedic consultation.            

## 2013-07-27 NOTE — Assessment & Plan Note (Signed)
Gradual decline, takes Namenda and lives in SNF.  

## 2013-07-27 NOTE — Assessment & Plan Note (Signed)
Takes Levothyroxine 25mcg daily, TSH 2.741 05/20/13    

## 2013-07-27 NOTE — Assessment & Plan Note (Signed)
takes Cymbalta 30mg bid-stable.    

## 2013-07-27 NOTE — Assessment & Plan Note (Signed)
Asymptomatic, urine culture 07/24/13 showed lactobacillus >=100,000c/ml, UA clear, neg nitrite, trace leukocyte esterase, wbc 3-6-will continue to monitor the patient since this organism is likely to have been a vaginal contaminant, rather than a urinary tract pathogen.

## 2013-07-27 NOTE — Progress Notes (Signed)
Patient ID: [name] [name], female   DOB: Mar 17, 1918, 80 y.o.   MRN: [id number]   Code Status: DNR  No Known Allergies  Chief Complaint  Patient presents with  . Medical Managment of Chronic Issues  . Acute Visit    urine culture showed Lactobacillus, heavy coated tongue.     HPI: Patient is a 80 y.o. female seen in the SNF at [facility] today for evaluation of asymptomatic urine culture grew Lactobacillus, bradycardia, blood pressure, and other chronic medical conditions.  Problem List Items Addressed This Visit   Chronic pain - Primary (Chronic)     Chronic since the left hip fx surgical repair 06/2010-pain is managed with Tramadol 50mg  tid and 100mg  daily,Tylenol 650mg  tid, Celebrex 200mg  daily. W/c for mobility when out of bed. X-ray 01/13/13 showed no new acute fracture, subluxation, or dislocation. Also showed old healed fracture deformity at the left femoral head, neck, and intertrochanteric region with orthopedic rod, plate and screws spanning form superolateral to the femoral head to the proximal shaft. Potion of the orthopedic screw appear outside the confines of the femoral head. s/p Orthopedic consultation.         Unspecified hypothyroidism (Chronic)     Takes Levothyroxine daily, TSH 2.741 05/20/13      GERD (gastroesophageal reflux disease)     Hx GI bleed, stable, Hgb11.9 07/14/11-11.6 09/29/39, off  Carafate 12/14/12 and  continue Omeprazole 40mg  bid.     Dementia     Gradual decline, takes Namenda and lives in [place].       Anemia     Resolved, last Hgb 14.0 06/12/13     Neuropathy     Stable on Gabapentin 100mg  tid and Tylenol 650mg  tid.       Anxiety     takes Cymbalta 30mg  bid-stable.      CAD (coronary artery disease) of artery bypass graft     Stable on Imdur 30mg . Last chest pain 06/30/13 relived with NTG and MaaLox.      Unspecified chronic bronchitis     Stable on Advair and spiriva      HTN (hypertension)     Takes Imdur 30mg  for CAD.  Takes Metoprolol to 25mg  bid, Losartan 50mg  bid,  off Clonidine 0.1mg  po qhs-controlled with occasionally elevated SBP which is asymptomatic to her. Bradycardia is resolved.      UTI (lower urinary tract infection)     Asymptomatic, urine culture 07/24/13 showed lactobacillus >=100,000c/ml, UA clear, neg nitrite, trace leukocyte esterase, wbc 3-6-will continue to monitor the patient since this organism is likely to have been a vaginal contaminant, rather than a urinary tract pathogen.     Bradycardia     Resolved since Metoprolol was decreased.     Thrush, oral     Magic Mouth Wash 5mg  S/S ac and hx x 10 days.        Review of Systems:  Review of Systems  Constitutional: Negative for fever, chills, weight loss, malaise/fatigue and diaphoresis.  HENT: Positive for hearing loss. Negative for congestion, ear pain and sore throat.   Eyes: Negative for pain, discharge and redness.  Respiratory: Positive for cough. Negative for sputum production, shortness of breath and wheezing.   Cardiovascular: Positive for leg swelling (trace in ankle, chronic, venous insufficiency) and PND. Negative for chest pain, orthopnea and claudication.       Reported chest pain x1 06/30/13. Bradycardia.   Gastrointestinal: Negative for heartburn, nausea, vomiting, abdominal pain, diarrhea, constipation and blood  in stool.          Genitourinary: Positive for frequency. Negative for dysuria, urgency and flank pain.  Musculoskeletal: Positive for back pain, joint pain (left hip pain. ), myalgias and neck pain. Negative for falls.  Skin: Negative for itching and rash.       BLE chronic venous insufficiency with mild erythema lower 1/3 of the BLE. A quarter size of the left stasis ulcer with yellow slough at the lateral of the middle of the right lower leg.   Neurological: Negative for dizziness, tingling, tremors, speech change, focal weakness, seizures, loss of consciousness and weakness. Sensory change: periphearl  neuropathy.  Endo/Heme/Allergies: Negative for environmental allergies and polydipsia. Does not bruise/bleed easily.  Psychiatric/Behavioral: Positive for memory loss. Negative for depression and hallucinations. The patient is not nervous/anxious and does not have insomnia.      Past Medical History  Diagnosis Date  . Hypertension   . COPD (chronic obstructive pulmonary disease)   . CHF (congestive heart failure)   . Coronary artery disease   . Renal disorder   . GERD (gastroesophageal reflux disease)   . Osteoporosis   . Syncope   . Dementia   . Anemia   . Parkinson disease   . Neuropathy   . Thyroid disease   . Hyperlipidemia   . Anxiety   . Depression   . Chronic pain 11/01/2011   Past Surgical History  Procedure Laterality Date  . Fracture surgery    . Esophagogastroduodenoscopy  05/05/2012    Procedure: ESOPHAGOGASTRODUODENOSCOPY (EGD);  Surgeon: [name], MD;  Location: [facility] ENDOSCOPY;  Service: Endoscopy;  Laterality: N/A;  Pediatric upper endoscope   Social History:   reports that she has never smoked. She does not have any smokeless tobacco history on file. She reports that she does not drink alcohol or use illicit drugs.  Medications: Patient's Medications  New Prescriptions   No medications on file  Previous Medications   ACETAMINOPHEN (TYLENOL) 325 MG TABLET    Take 650 mg by mouth 3 (three) times daily.   ACYCLOVIR (ZOVIRAX) 200 MG CAPSULE    Take 200 mg by mouth daily.   CALCIUM-VITAMIN D (OSCAL WITH D) 500-200 MG-UNIT PER TABLET    Take 1 tablet by mouth daily.   CELECOXIB (CELEBREX) 200 MG CAPSULE    Take 200 mg by mouth daily.   DULOXETINE (CYMBALTA) 30 MG CAPSULE    Take 30 mg by mouth 2 (two) times daily.   FLUTICASONE-SALMETEROL (ADVAIR) 100-50 MCG/DOSE AEPB    Inhale 1 puff into the lungs every 12 (twelve) hours.   GABAPENTIN (NEURONTIN) 100 MG CAPSULE    Take 100 mg by mouth 3 (three) times daily.   HYDROXYPROPYL METHYLCELLULOSE (ISOPTO TEARS)  2.5 % OPHTHALMIC SOLUTION    Place 1 drop into both eyes every 4 (four) hours.   ISOSORBIDE MONONITRATE (IMDUR) 30 MG 24 HR TABLET    Take 30 mg by mouth daily.   LEVOTHYROXINE (SYNTHROID, LEVOTHROID) 25 MCG TABLET    Take 25 mcg by mouth daily.    LOSARTAN (COZAAR) 50 MG TABLET    Take 50 mg by mouth 2 (two) times daily.   MEMANTINE (NAMENDA) 5 MG TABLET    Take 14 mg by mouth daily.    METOPROLOL TARTRATE (LOPRESSOR) 25 MG TABLET    Take 25 mg by mouth 2 (two) times daily.   OMEPRAZOLE (PRILOSEC) 20 MG CAPSULE    Take 2 capsules (40 mg total) by mouth 2 (  two) times daily.   TIOTROPIUM (SPIRIVA) 18 MCG INHALATION CAPSULE    Place 18 mcg into inhaler and inhale daily.   TRAMADOL (ULTRAM) 50 MG TABLET    1 by mouth three times daily (6 am, 12pm, 6 pm, 12 midnight)  Modified Medications   No medications on file  Discontinued Medications   No medications on file     Physical Exam: Physical Exam  Constitutional: She is oriented to person, place, and time. She appears well-developed and well-nourished.  HENT:  Head: Normocephalic and atraumatic.  Heavy coating tongue.   Eyes: Conjunctivae and EOM are normal. Pupils are equal, round, and reactive to light.  Neck: Normal range of motion. No JVD present. No thyromegaly present.  Chronic decreased lateral ROM of the neck   Cardiovascular: Regular rhythm and normal heart sounds.   No murmur heard. HR 52 upon my auscultation.   Pulmonary/Chest: Effort normal. She has no wheezes. She has no rales.  Abdominal: Soft. Bowel sounds are normal.  Musculoskeletal: She exhibits edema and tenderness.  Mainly pain in neck and left hip  Lymphadenopathy:    She has no cervical adenopathy.  Neurological: She is alert and oriented to person, place, and time. She has normal reflexes. No cranial nerve deficit. She exhibits normal muscle tone. Coordination normal.  Skin: Skin is warm and dry. No lesion and no rash noted. No erythema.  New skin lesion at  bridge of her nose, new, scaly, superficial, non healing--11/17/12 Dermatology consultation: Bowen's disease-R leg. 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 BLE chronic venous insufficiency with mild erythema lower 1/3 of the BLE. A quarter size of the left stasis ulcer with yellow slough at the lateral of the middle of the right lower leg.     Psychiatric: Cognition and memory are impaired. She exhibits abnormal recent memory.    Filed Vitals:   07/27/13 1616  BP: 124/84  Pulse: 54  Temp: 98.3 F (36.8 C)  TempSrc: Tympanic  Resp: 18      Labs reviewed: Basic Metabolic Panel:  Recent Labs  16/10/96 02/25/13  05/20/13  06/12/13 07/08/13 07/26/13  NA 141  --   < >  --   < > 139 141 141  K 3.8  --   < >  --   < > 4.0 3.9 4.2  BUN 24*  --   < >  --   < > 29* 24* 24*  CREATININE 0.9  --   < >  --   < > 1.2* 1.3* 1.0  TSH 4.21 5.47  --  2.74  --   --   --   --   < > = values in this interval not displayed. CBC:  Recent Labs  02/25/13 06/07/13 06/12/13  WBC 10.0 11.5 6.8  HGB 13.3 13.6 14.0  HCT 41 40 42  PLT 371 267 271    Past procedures:  06/07/13 CXR borderline cardiomegaly unchanged with new minimal pulmonary vascular congestion, patchy bibasilar atelectasis or pneumonitis appears new.   Assessment/Plan Chronic pain Chronic since the left hip fx surgical repair 06/2010-pain is managed with Tramadol 50mg  tid and 100mg  daily,Tylenol 650mg  tid, Celebrex 200mg  daily. W/c for mobility when out of bed. X-ray 01/13/13 showed no new acute fracture, subluxation, or dislocation. Also showed old healed fracture deformity at the left femoral head, neck, and intertrochanteric region with orthopedic rod, plate and screws spanning form superolateral to the femoral head to the proximal shaft.  Potion of the orthopedic screw appear outside the confines of the femoral head. s/p Orthopedic consultation.       Unspecified hypothyroidism Takes  Levothyroxine 25mcg daily, TSH 2.741 05/20/13    GERD (gastroesophageal reflux disease) Hx GI bleed, stable, Hgb11.9 07/14/11-11.6 09/29/39, off  Carafate 12/14/12 and  continue Omeprazole 40mg  bid.   Dementia Gradual decline, takes Namenda and [place]     Anemia Resolved, last Hgb 14.0 06/12/13   Neuropathy Stable on Gabapentin 100mg  tid and Tylenol 650mg  tid.     Anxiety takes Cymbalta 30mg  bid-stable.    CAD (coronary artery disease) of artery bypass graft Stable on Imdur 30mg . Last chest pain 06/30/13 relived with NTG and MaaLox.    Unspecified chronic bronchitis Stable on Advair and spiriva    HTN (hypertension) Takes Imdur 30mg  for CAD. Takes Metoprolol to 25mg  bid, Losartan 50mg  bid,  off Clonidine 0.1mg  po qhs-controlled with occasionally elevated SBP which is asymptomatic to her. Bradycardia is resolved.    UTI (lower urinary tract infection) Asymptomatic, urine culture 07/24/13 showed lactobacillus >=100,000c/ml, UA clear, neg nitrite, trace leukocyte esterase, wbc 3-6-will continue to monitor the patient since this organism is likely to have been a vaginal contaminant, rather than a urinary tract pathogen.   Bradycardia Resolved since Metoprolol was decreased.   Thrush, oral Magic Mouth Wash 5mg  S/S ac and hx x 10 days.     Family/ Staff Communication: observe the patient.   Goals of Care: SNF  Labs/tests ordered: EKG pending.

## 2013-07-27 NOTE — Assessment & Plan Note (Signed)
Resolved, last Hgb 14.0 06/12/13   

## 2013-07-27 NOTE — Assessment & Plan Note (Signed)
Takes Imdur 30mg  for CAD. Takes Metoprolol to 25mg  bid, Losartan 50mg  bid,  off Clonidine 0.1mg  po qhs-controlled with occasionally elevated SBP which is asymptomatic to her. Bradycardia is resolved.

## 2013-07-27 NOTE — Assessment & Plan Note (Signed)
Resolved since Metoprolol was decreased.

## 2013-07-27 NOTE — Assessment & Plan Note (Signed)
Magic Mouth Wash 5mg  S/S ac and hx x 10 days.

## 2013-07-27 NOTE — Assessment & Plan Note (Signed)
Hx GI bleed, stable, Hgb11.9 07/14/11-11.6 09/29/39, off  Carafate 12/14/12 and  continue Omeprazole 40mg bid.   

## 2013-07-27 NOTE — Assessment & Plan Note (Signed)
Stable on Advair and spiriva

## 2013-07-27 NOTE — Assessment & Plan Note (Signed)
Stable on Gabapentin 100mg tid and Tylenol 650mg tid.   

## 2013-07-27 NOTE — Assessment & Plan Note (Signed)
Stable on Imdur 30mg. Last chest pain 06/30/13 relived with NTG and MaaLox.    

## 2013-08-10 ENCOUNTER — Non-Acute Institutional Stay (SKILLED_NURSING_FACILITY): Payer: Medicare Other | Admitting: Nurse Practitioner

## 2013-08-10 ENCOUNTER — Encounter: Payer: Self-pay | Admitting: Nurse Practitioner

## 2013-08-10 DIAGNOSIS — I10 Essential (primary) hypertension: Secondary | ICD-10-CM

## 2013-08-10 DIAGNOSIS — J42 Unspecified chronic bronchitis: Secondary | ICD-10-CM

## 2013-08-10 DIAGNOSIS — G589 Mononeuropathy, unspecified: Secondary | ICD-10-CM

## 2013-08-10 DIAGNOSIS — F419 Anxiety disorder, unspecified: Secondary | ICD-10-CM

## 2013-08-10 DIAGNOSIS — F3289 Other specified depressive episodes: Secondary | ICD-10-CM

## 2013-08-10 DIAGNOSIS — K219 Gastro-esophageal reflux disease without esophagitis: Secondary | ICD-10-CM

## 2013-08-10 DIAGNOSIS — G8929 Other chronic pain: Secondary | ICD-10-CM

## 2013-08-10 DIAGNOSIS — R001 Bradycardia, unspecified: Secondary | ICD-10-CM

## 2013-08-10 DIAGNOSIS — F329 Major depressive disorder, single episode, unspecified: Secondary | ICD-10-CM

## 2013-08-10 DIAGNOSIS — I498 Other specified cardiac arrhythmias: Secondary | ICD-10-CM

## 2013-08-10 DIAGNOSIS — F0390 Unspecified dementia without behavioral disturbance: Secondary | ICD-10-CM

## 2013-08-10 DIAGNOSIS — E039 Hypothyroidism, unspecified: Secondary | ICD-10-CM

## 2013-08-10 DIAGNOSIS — F411 Generalized anxiety disorder: Secondary | ICD-10-CM

## 2013-08-10 DIAGNOSIS — F32A Depression, unspecified: Secondary | ICD-10-CM

## 2013-08-10 DIAGNOSIS — G629 Polyneuropathy, unspecified: Secondary | ICD-10-CM

## 2013-08-10 DIAGNOSIS — B370 Candidal stomatitis: Secondary | ICD-10-CM

## 2013-08-10 NOTE — Assessment & Plan Note (Signed)
Chronic since the left hip fx surgical repair 06/2010-pain is managed with Tramadol 50mg tid and 100mg daily,Tylenol 650mg tid, Celebrex 200mg daily. W/c for mobility when out of bed. X-ray 01/13/13 showed no new acute fracture, subluxation, or dislocation. Also showed old healed fracture deformity at the left femoral head, neck, and intertrochanteric region with orthopedic rod, plate and screws spanning form superolateral to the femoral head to the proximal shaft. Potion of the orthopedic screw appear outside the confines of the femoral head. s/p Orthopedic consultation.            

## 2013-08-10 NOTE — Assessment & Plan Note (Signed)
Stable on Gabapentin 100mg tid and Tylenol 650mg tid.   

## 2013-08-10 NOTE — Assessment & Plan Note (Signed)
Takes Imdur 30mg for CAD. Takes Metoprolol to 25mg bid, Losartan 50mg bid,  off Clonidine 0.1mg po qhs-controlled with occasionally elevated SBP which is asymptomatic to her. Bradycardia is resolved.   

## 2013-08-10 NOTE — Assessment & Plan Note (Signed)
Resolved, HR in 60s, off Clonidine. Decreased Metoprolol to 25mg  bid

## 2013-08-10 NOTE — Assessment & Plan Note (Signed)
Completed Magic Mouth Wash 5mg  S/S ac and hx x 10 days. Improved and but not healed. Continue Magic Mouth Wash 5mg  s/s ac and hs. Diflucan 200mg  po x1

## 2013-08-10 NOTE — Assessment & Plan Note (Signed)
takes Cymbalta 30mg bid-stable.    

## 2013-08-10 NOTE — Assessment & Plan Note (Signed)
Maintained on Spiriva and Advair. Chronic cough 

## 2013-08-10 NOTE — Assessment & Plan Note (Signed)
Hx GI bleed, stable, Hgb11.9 07/14/11-11.6 09/29/39, off  Carafate 12/14/12 and  continue Omeprazole 40mg bid.   

## 2013-08-10 NOTE — Assessment & Plan Note (Signed)
Gradual decline, takes Namenda and lives in SNF.  

## 2013-08-10 NOTE — Progress Notes (Signed)
Patient ID: [name] [name], female   DOB: 03-06-1918, 80 y.o.   MRN: [id number]   Code Status: DNR  No Known Allergies  Chief Complaint  Patient presents with  . Medical Managment of Chronic Issues  . Acute Visit    sore tongue    HPI: Patient is a 80 y.o. female seen in the SNF at [facility] today for evaluation of sore tongue and other chronic medical conditions.  Problem List Items Addressed This Visit   Anxiety - Primary     takes Cymbalta 30mg  bid-stable.       Bradycardia     Resolved, HR in 60s, off Clonidine. Decreased Metoprolol to 25mg  bid      Chronic pain (Chronic)     Chronic since the left hip fx surgical repair 06/2010-pain is managed with Tramadol 50mg  tid and 100mg  daily,Tylenol 650mg  tid, Celebrex 200mg  daily. W/c for mobility when out of bed. X-ray 01/13/13 showed no new acute fracture, subluxation, or dislocation. Also showed old healed fracture deformity at the left femoral head, neck, and intertrochanteric region with orthopedic rod, plate and screws spanning form superolateral to the femoral head to the proximal shaft. Potion of the orthopedic screw appear outside the confines of the femoral head. s/p Orthopedic consultation.        Dementia     Gradual decline, takes Namenda and lives in [place].       Depression     takes Cymbalta 30mg  bid-stable.      GERD (gastroesophageal reflux disease)     Hx GI bleed, stable, Hgb11.9 07/14/11-11.6 09/29/39, off  Carafate 12/14/12 and  continue Omeprazole 40mg  bid.      HTN (hypertension)     Takes Imdur 30mg  for CAD. Takes Metoprolol to 25mg  bid, Losartan 50mg  bid,  off Clonidine 0.1mg  po qhs-controlled with occasionally elevated SBP which is asymptomatic to her. Bradycardia is resolved.      Neuropathy     Stable on Gabapentin 100mg  tid and Tylenol 650mg  tid.       Thrush, oral     Completed Magic Mouth Wash 5mg  S/S ac and hx x 10 days. Improved and but not healed. Continue Magic Mouth Wash 5mg  s/s ac and  hs. Diflucan 200mg  po x1     Unspecified chronic bronchitis     Maintained on Spiriva and Advair. Chronic cough.        Unspecified hypothyroidism (Chronic)     Takes Levothyroxine daily, TSH 2.741 05/20/13         Review of Systems:  Review of Systems  Constitutional: Negative for fever, chills, weight loss, malaise/fatigue and diaphoresis.  HENT: Positive for hearing loss. Negative for congestion, ear pain and sore throat.        C/o sore tongue-Inflamed tongue and scattered small white patches   Eyes: Negative for pain, discharge and redness.  Respiratory: Positive for cough. Negative for sputum production, shortness of breath and wheezing.   Cardiovascular: Positive for leg swelling (trace in ankle, chronic, venous insufficiency) and PND. Negative for chest pain, orthopnea and claudication.       Reported chest pain x1 06/30/13. Bradycardia.   Gastrointestinal: Negative for heartburn, nausea, vomiting, abdominal pain, diarrhea, constipation and blood in stool.          Genitourinary: Positive for frequency. Negative for dysuria, urgency and flank pain.  Musculoskeletal: Positive for back pain, joint pain (left hip pain. ), myalgias and neck pain. Negative for falls.  Skin: Negative for itching  and rash.       BLE chronic venous insufficiency with mild erythema lower 1/3 of the BLE. A quarter size of the left stasis ulcer with yellow slough at the lateral of the middle of the right lower leg.   Neurological: Negative for dizziness, tingling, tremors, speech change, focal weakness, seizures, loss of consciousness and weakness. Sensory change: periphearl neuropathy.  Endo/Heme/Allergies: Negative for environmental allergies and polydipsia. Does not bruise/bleed easily.  Psychiatric/Behavioral: Positive for memory loss. Negative for depression and hallucinations. The patient is not nervous/anxious and does not have insomnia.      Past Medical History  Diagnosis Date  .  Hypertension   . COPD (chronic obstructive pulmonary disease)   . CHF (congestive heart failure)   . Coronary artery disease   . Renal disorder   . GERD (gastroesophageal reflux disease)   . Osteoporosis   . Syncope   . Dementia   . Anemia   . Parkinson disease   . Neuropathy   . Thyroid disease   . Hyperlipidemia   . Anxiety   . Depression   . Chronic pain 11/01/2011   Past Surgical History  Procedure Laterality Date  . Fracture surgery    . Esophagogastroduodenoscopy  05/05/2012    Procedure: ESOPHAGOGASTRODUODENOSCOPY (EGD);  Surgeon: Florencia Reasonsobert V Buccini, MD;  [facility];  Service: Endoscopy;  Laterality: N/A;  Pediatric upper endoscope   Social History:   reports that she has never smoked. She does not have any smokeless tobacco history on file. She reports that she does not drink alcohol or use illicit drugs.  Medications: Patient's Medications  New Prescriptions   No medications on file  Previous Medications   ACETAMINOPHEN (TYLENOL) 325 MG TABLET    Take 650 mg by mouth 3 (three) times daily.   ACYCLOVIR (ZOVIRAX) 200 MG CAPSULE    Take 200 mg by mouth daily.   CALCIUM-VITAMIN D (OSCAL WITH D) 500-200 MG-UNIT PER TABLET    Take 1 tablet by mouth daily.   CELECOXIB (CELEBREX) 200 MG CAPSULE    Take 200 mg by mouth daily.   DULOXETINE (CYMBALTA) 30 MG CAPSULE    Take 30 mg by mouth 2 (two) times daily.   FLUTICASONE-SALMETEROL (ADVAIR) 100-50 MCG/DOSE AEPB    Inhale 1 puff into the lungs every 12 (twelve) hours.   GABAPENTIN (NEURONTIN) 100 MG CAPSULE    Take 100 mg by mouth 3 (three) times daily.   HYDROXYPROPYL METHYLCELLULOSE (ISOPTO TEARS) 2.5 % OPHTHALMIC SOLUTION    Place 1 drop into both eyes every 4 (four) hours.   ISOSORBIDE MONONITRATE (IMDUR) 30 MG 24 HR TABLET    Take 30 mg by mouth daily.   LEVOTHYROXINE (SYNTHROID, LEVOTHROID) 25 MCG TABLET    Take 25 mcg by mouth daily.    LOSARTAN (COZAAR) 50 MG TABLET    Take 50 mg by mouth 2 (two) times daily.     MEMANTINE (NAMENDA) 5 MG TABLET    Take 14 mg by mouth daily.    METOPROLOL TARTRATE (LOPRESSOR) 25 MG TABLET    Take 25 mg by mouth 2 (two) times daily.   OMEPRAZOLE (PRILOSEC) 20 MG CAPSULE    Take 2 capsules (40 mg total) by mouth 2 (two) times daily.   TIOTROPIUM (SPIRIVA) 18 MCG INHALATION CAPSULE    Place 18 mcg into inhaler and inhale daily.   TRAMADOL (ULTRAM) 50 MG TABLET    1 by mouth three times daily (6 am, 12pm, 6 pm, 12 midnight)  Modified Medications   No medications on file  Discontinued Medications   No medications on file     Physical Exam: Physical Exam  Constitutional: She is oriented to person, place, and time. She appears well-developed and well-nourished.  HENT:  Head: Normocephalic and atraumatic.  Inflamed tongue and scattered small white patches   Eyes: Conjunctivae and EOM are normal. Pupils are equal, round, and reactive to light.  Neck: Normal range of motion. No JVD present. No thyromegaly present.  Chronic decreased lateral ROM of the neck   Cardiovascular: Regular rhythm and normal heart sounds.   No murmur heard. HR 52 upon my auscultation.   Pulmonary/Chest: Effort normal. She has no wheezes. She has no rales.  Abdominal: Soft. Bowel sounds are normal.  Musculoskeletal: She exhibits edema and tenderness.  Mainly pain in neck and left hip  Lymphadenopathy:    She has no cervical adenopathy.  Neurological: She is alert and oriented to person, place, and time. She has normal reflexes. No cranial nerve deficit. She exhibits normal muscle tone. Coordination normal.  Skin: Skin is warm and dry. No lesion and no rash noted. No erythema.  New skin lesion at bridge of her nose, new, scaly, superficial, non healing--11/17/12 Dermatology consultation: Bowen's disease-R leg. 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 BLE chronic venous insufficiency with mild erythema lower 1/3 of the BLE. A quarter size of the left stasis  ulcer with yellow slough at the lateral of the middle of the right lower leg.     Psychiatric: Cognition and memory are impaired. She exhibits abnormal recent memory.    Filed Vitals:   08/10/13 1444  BP: 154/83  Pulse: 62  Temp: 97.5 F (36.4 C)  TempSrc: Tympanic  Resp: 18      Labs reviewed: Basic Metabolic Panel:  Recent Labs  21/30/86 02/25/13  05/20/13  06/12/13 07/08/13 07/26/13  NA 141  --   < >  --   < > 139 141 141  K 3.8  --   < >  --   < > 4.0 3.9 4.2  BUN 24*  --   < >  --   < > 29* 24* 24*  CREATININE 0.9  --   < >  --   < > 1.2* 1.3* 1.0  TSH 4.21 5.47  --  2.74  --   --   --   --   < > = values in this interval not displayed. CBC:  Recent Labs  02/25/13 06/07/13 06/12/13  WBC 10.0 11.5 6.8  HGB 13.3 13.6 14.0  HCT 41 40 42  PLT 371 267 271    Past procedures:  06/07/13 CXR borderline cardiomegaly unchanged with new minimal pulmonary vascular congestion, patchy bibasilar atelectasis or pneumonitis appears new.   Assessment/Plan Anxiety takes Cymbalta 30mg  bid-stable.     Bradycardia Resolved, HR in 60s, off Clonidine. Decreased Metoprolol to 25mg  bid    Chronic pain Chronic since the left hip fx surgical repair 06/2010-pain is managed with Tramadol 50mg  tid and 100mg  daily,Tylenol 650mg  tid, Celebrex 200mg  daily. W/c for mobility when out of bed. X-ray 01/13/13 showed no new acute fracture, subluxation, or dislocation. Also showed old healed fracture deformity at the left femoral head, neck, and intertrochanteric region with orthopedic rod, plate and screws spanning form superolateral to the femoral head to the proximal shaft. Potion of the orthopedic screw appear outside the confines of the femoral head. s/p Orthopedic consultation.  Dementia Gradual decline, takes Namenda and lives in [place].     Depression takes Cymbalta 30mg  bid-stable.    GERD (gastroesophageal reflux disease) Hx GI bleed, stable, Hgb11.9 07/14/11-11.6 09/29/39, off   Carafate 12/14/12 and  continue Omeprazole 40mg  bid.    HTN (hypertension) Takes Imdur 30mg  for CAD. Takes Metoprolol to 25mg  bid, Losartan 50mg  bid,  off Clonidine 0.1mg  po qhs-controlled with occasionally elevated SBP which is asymptomatic to her. Bradycardia is resolved.    Neuropathy Stable on Gabapentin 100mg  tid and Tylenol 650mg  tid.     Unspecified hypothyroidism Takes Levothyroxine daily, TSH 2.741 05/20/13    Unspecified chronic bronchitis Maintained on Spiriva and Advair. Chronic cough.      Thrush, oral Completed Magic Mouth Wash 5mg  S/S ac and hx x 10 days. Improved and but not healed. Continue Magic Mouth Wash 5mg  s/s ac and hs. Diflucan 200mg  po x1     Family/ Staff Communication: observe the patient.   Goals of Care: SNF  Labs/tests ordered: none

## 2013-08-10 NOTE — Assessment & Plan Note (Signed)
Takes Levothyroxine 25mcg daily, TSH 2.741 05/20/13    

## 2013-08-31 ENCOUNTER — Encounter: Payer: Self-pay | Admitting: Nurse Practitioner

## 2013-08-31 ENCOUNTER — Non-Acute Institutional Stay (SKILLED_NURSING_FACILITY): Payer: Medicare Other | Admitting: Nurse Practitioner

## 2013-08-31 DIAGNOSIS — E039 Hypothyroidism, unspecified: Secondary | ICD-10-CM

## 2013-08-31 DIAGNOSIS — F0390 Unspecified dementia without behavioral disturbance: Secondary | ICD-10-CM

## 2013-08-31 DIAGNOSIS — M25559 Pain in unspecified hip: Secondary | ICD-10-CM

## 2013-08-31 DIAGNOSIS — F32A Depression, unspecified: Secondary | ICD-10-CM

## 2013-08-31 DIAGNOSIS — G589 Mononeuropathy, unspecified: Secondary | ICD-10-CM

## 2013-08-31 DIAGNOSIS — I10 Essential (primary) hypertension: Secondary | ICD-10-CM

## 2013-08-31 DIAGNOSIS — B370 Candidal stomatitis: Secondary | ICD-10-CM

## 2013-08-31 DIAGNOSIS — F3289 Other specified depressive episodes: Secondary | ICD-10-CM

## 2013-08-31 DIAGNOSIS — I25810 Atherosclerosis of coronary artery bypass graft(s) without angina pectoris: Secondary | ICD-10-CM

## 2013-08-31 DIAGNOSIS — K219 Gastro-esophageal reflux disease without esophagitis: Secondary | ICD-10-CM

## 2013-08-31 DIAGNOSIS — F329 Major depressive disorder, single episode, unspecified: Secondary | ICD-10-CM

## 2013-08-31 DIAGNOSIS — M25552 Pain in left hip: Secondary | ICD-10-CM

## 2013-08-31 DIAGNOSIS — G629 Polyneuropathy, unspecified: Secondary | ICD-10-CM

## 2013-08-31 DIAGNOSIS — J42 Unspecified chronic bronchitis: Secondary | ICD-10-CM

## 2013-08-31 NOTE — Assessment & Plan Note (Signed)
Stable on Gabapentin 100mg  tid and Tylenol 650mg  qid

## 2013-08-31 NOTE — Assessment & Plan Note (Signed)
Hx GI bleed, stable, Hgb11.9 07/14/11-11.6 09/29/39, off  Carafate 12/14/12 and  continue Omeprazole 40mg bid.   

## 2013-08-31 NOTE — Assessment & Plan Note (Addendum)
Takes Imdur 30mg  for CAD. Takes Metoprolol to 25mg  bid, Losartan 50mg  bid,  off Clonidine 0.1mg  po qhs-controlled with occasionally elevated SBP which is asymptomatic to her. Bradycardia is resolved.   Bps 162.4963m 156/88, 121/78, [id number],152/73.  Update BMP since Losartan up to 50mg  bid 07/23/13

## 2013-08-31 NOTE — Assessment & Plan Note (Signed)
Chronic since the left hip fx surgical repair 06/2010-pain is managed with Tramadol 50mg  qid,Tylenol 650mg  qid, Celebrex 200mg  daily. W/c for mobility when out of bed. X-ray 01/13/13 showed no new acute fracture, subluxation, or dislocation. Also showed old healed fracture deformity at the left femoral head, neck, and intertrochanteric region with orthopedic rod, plate and screws spanning form superolateral to the femoral head to the proximal shaft. Potion of the orthopedic screw appear outside the confines of the femoral head. s/p Orthopedic consultation.

## 2013-08-31 NOTE — Assessment & Plan Note (Signed)
Takes Levothyroxine 25mcg daily, TSH 2.741 05/20/13    

## 2013-08-31 NOTE — Assessment & Plan Note (Signed)
Maintained on Spiriva and Advair. Chronic cough.

## 2013-08-31 NOTE — Assessment & Plan Note (Signed)
Improved, takes Magic mouth wash.

## 2013-08-31 NOTE — Assessment & Plan Note (Signed)
Gradual decline, takes Namenda and lives in SNF.  

## 2013-08-31 NOTE — Assessment & Plan Note (Signed)
takes Cymbalta 30mg bid-stable.    

## 2013-08-31 NOTE — Assessment & Plan Note (Signed)
Stable on Imdur 30mg. Last chest pain 06/30/13 relived with NTG and MaaLox.    

## 2013-08-31 NOTE — Progress Notes (Signed)
Patient ID: [name] [name], female   DOB: [birth year], 80 y.o.   MRN: [id number]   Code Status: DNR  No Known Allergies  Chief Complaint  Patient presents with  . Medical Management of Chronic Issues    HPI: Patient is a 80 y.o. female seen in the SNF at [facility] today for evaluation of  chronic medical conditions.  Problem List Items Addressed This Visit   CAD (coronary artery disease) of artery bypass graft     Stable on Imdur 30mg . Last chest pain 06/30/13 relived with NTG and MaaLox.       Dementia     Gradual decline, takes Namenda and lives in [place].        Depression     takes Cymbalta 30mg  bid-stable.       GERD (gastroesophageal reflux disease)     Hx GI bleed, stable, Hgb11.9 07/14/11-11.6 09/29/39, off  Carafate 12/14/12 and  continue Omeprazole 40mg  bid.       HTN (hypertension)     Takes Imdur 30mg  for CAD. Takes Metoprolol to 25mg  bid, Losartan 50mg  bid,  off Clonidine 0.1mg  po qhs-controlled with occasionally elevated SBP which is asymptomatic to her. Bradycardia is resolved.   Bps 162.46m 156/88, 121/78, [id number].  Update BMP since Losartan up to 50mg  bid 07/23/13     Neuropathy     Stable on Gabapentin 100mg  tid and Tylenol 650mg  qid     Pain in left hip - Primary     Chronic since the left hip fx surgical repair 06/2010-pain is managed with Tramadol 50mg  qid,Tylenol 650mg  qid, Celebrex 200mg  daily. W/c for mobility when out of bed. X-ray 01/13/13 showed no new acute fracture, subluxation, or dislocation. Also showed old healed fracture deformity at the left femoral head, neck, and intertrochanteric region with orthopedic rod, plate and screws spanning form superolateral to the femoral head to the proximal shaft. Potion of the orthopedic screw appear outside the confines of the femoral head. s/p Orthopedic consultation.        Thrush, oral     Improved, takes Magic mouth wash.     Unspecified chronic bronchitis     Maintained on Spiriva and  Advair. Chronic cough.       Unspecified hypothyroidism (Chronic)     Takes Levothyroxine daily, TSH 2.741 05/20/13         Review of Systems:  Review of Systems  Constitutional: Negative for fever, chills, weight loss, malaise/fatigue and diaphoresis.  HENT: Positive for hearing loss. Negative for congestion, ear pain and sore throat.        C/o sore tongue-better with Magic mouth wash.   Eyes: Negative for pain, discharge and redness.  Respiratory: Positive for cough. Negative for sputum production, shortness of breath and wheezing.   Cardiovascular: Positive for leg swelling (trace in ankle, chronic, venous insufficiency). Negative for chest pain, orthopnea, claudication and PND.       Reported chest pain x1 06/30/13. Bradycardia. Resolved.   Gastrointestinal: Negative for heartburn, nausea, vomiting, abdominal pain, diarrhea, constipation and blood in stool.          Genitourinary: Positive for frequency. Negative for dysuria, urgency and flank pain.  Musculoskeletal: Positive for back pain, joint pain (left hip pain. ), myalgias and neck pain. Negative for falls.  Skin: Negative for itching and rash.       BLE chronic venous insufficiency with mild erythema lower 1/3 of the BLE. A quarter size of the left stasis ulcer with yellow  slough at the lateral of the middle of the right lower leg.   Neurological: Negative for dizziness, tingling, tremors, speech change, focal weakness, seizures, loss of consciousness and weakness. Sensory change: periphearl neuropathy.  Endo/Heme/Allergies: Negative for environmental allergies and polydipsia. Does not bruise/bleed easily.  Psychiatric/Behavioral: Positive for memory loss. Negative for depression and hallucinations. The patient is not nervous/anxious and does not have insomnia.      Past Medical History  Diagnosis Date  . Hypertension   . COPD (chronic obstructive pulmonary disease)   . CHF (congestive heart failure)   . Coronary  artery disease   . Renal disorder   . GERD (gastroesophageal reflux disease)   . Osteoporosis   . Syncope   . Dementia   . Anemia   . Parkinson disease   . Neuropathy   . Thyroid disease   . Hyperlipidemia   . Anxiety   . Depression   . Chronic pain 11/01/2011   Past Surgical History  Procedure Laterality Date  . Fracture surgery    . Esophagogastroduodenoscopy  05/05/2012    Procedure: ESOPHAGOGASTRODUODENOSCOPY (EGD);  Surgeon: Florencia Reasonsobert V Buccini, MD;  [facility];  Service: Endoscopy;  Laterality: N/A;  Pediatric upper endoscope   Social History:   reports that she has never smoked. She does not have any smokeless tobacco history on file. She reports that she does not drink alcohol or use illicit drugs.  Medications: Patient's Medications  New Prescriptions   No medications on file  Previous Medications   ACETAMINOPHEN (TYLENOL) 325 MG TABLET    Take 650 mg by mouth 3 (three) times daily.   ACYCLOVIR (ZOVIRAX) 200 MG CAPSULE    Take 200 mg by mouth daily.   CALCIUM-VITAMIN D (OSCAL WITH D) 500-200 MG-UNIT PER TABLET    Take 1 tablet by mouth daily.   CELECOXIB (CELEBREX) 200 MG CAPSULE    Take 200 mg by mouth daily.   DULOXETINE (CYMBALTA) 30 MG CAPSULE    Take 30 mg by mouth 2 (two) times daily.   FLUTICASONE-SALMETEROL (ADVAIR) 100-50 MCG/DOSE AEPB    Inhale 1 puff into the lungs every 12 (twelve) hours.   GABAPENTIN (NEURONTIN) 100 MG CAPSULE    Take 100 mg by mouth 3 (three) times daily.   HYDROXYPROPYL METHYLCELLULOSE (ISOPTO TEARS) 2.5 % OPHTHALMIC SOLUTION    Place 1 drop into both eyes every 4 (four) hours.   ISOSORBIDE MONONITRATE (IMDUR) 30 MG 24 HR TABLET    Take 30 mg by mouth daily.   LEVOTHYROXINE (SYNTHROID, LEVOTHROID) 25 MCG TABLET    Take 25 mcg by mouth daily.    LOSARTAN (COZAAR) 50 MG TABLET    Take 50 mg by mouth 2 (two) times daily.   MEMANTINE (NAMENDA) 5 MG TABLET    Take 14 mg by mouth daily.    METOPROLOL TARTRATE (LOPRESSOR) 25 MG TABLET     Take 25 mg by mouth 2 (two) times daily.   OMEPRAZOLE (PRILOSEC) 20 MG CAPSULE    Take 2 capsules (40 mg total) by mouth 2 (two) times daily.   TIOTROPIUM (SPIRIVA) 18 MCG INHALATION CAPSULE    Place 18 mcg into inhaler and inhale daily.   TRAMADOL (ULTRAM) 50 MG TABLET    1 by mouth three times daily (6 am, 12pm, 6 pm, 12 midnight)  Modified Medications   No medications on file  Discontinued Medications   No medications on file     Physical Exam: Physical Exam  Constitutional: She is oriented  to person, place, and time. She appears well-developed and well-nourished.  HENT:  Head: Normocephalic and atraumatic.  Inflamed tongue and scattered small white patches-improved.   Eyes: Conjunctivae and EOM are normal. Pupils are equal, round, and reactive to light.  Neck: Normal range of motion. No JVD present. No thyromegaly present.  Chronic decreased lateral ROM of the neck   Cardiovascular: Regular rhythm and normal heart sounds.   No murmur heard. HR 60s  Pulmonary/Chest: Effort normal. She has no wheezes. She has no rales.  Abdominal: Soft. Bowel sounds are normal.  Musculoskeletal: She exhibits edema and tenderness.  Mainly pain in neck and left hip  Lymphadenopathy:    She has no cervical adenopathy.  Neurological: She is alert and oriented to person, place, and time. She has normal reflexes. No cranial nerve deficit. She exhibits normal muscle tone. Coordination normal.  Skin: Skin is warm and dry. No lesion and no rash noted. No erythema.  New skin lesion at bridge of her nose, new, scaly, superficial, non healing--11/17/12 Dermatology consultation: Bowen's disease-R leg. 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 BLE chronic venous insufficiency with mild erythema lower 1/3 of the BLE. A quarter size of the left stasis ulcer with yellow slough at the lateral of the middle of the right lower leg.     Psychiatric: Cognition and memory are  impaired. She exhibits abnormal recent memory.    Filed Vitals:   08/31/13 1255  BP: 142/100  Pulse: 62  Temp: 98.6 F (37 C)  TempSrc: Tympanic  Resp: 12      Labs reviewed: Basic Metabolic Panel:  Recent Labs  32/44/0105/19/14 02/25/13  05/20/13  06/12/13 07/08/13 07/26/13  NA 141  --   < >  --   < > 139 141 141  K 3.8  --   < >  --   < > 4.0 3.9 4.2  BUN 24*  --   < >  --   < > 29* 24* 24*  CREATININE 0.9  --   < >  --   < > 1.2* 1.3* 1.0  TSH 4.21 5.47  --  2.74  --   --   --   --   < > = values in this interval not displayed. CBC:  Recent Labs  02/25/13 06/07/13 06/12/13  WBC 10.0 11.5 6.8  HGB 13.3 13.6 14.0  HCT 41 40 42  PLT 371 267 271    Past procedures:  06/07/13 CXR borderline cardiomegaly unchanged with new minimal pulmonary vascular congestion, patchy bibasilar atelectasis or pneumonitis appears new.   Assessment/Plan Pain in left hip Chronic since the left hip fx surgical repair 06/2010-pain is managed with Tramadol 50mg  qid,Tylenol 650mg  qid, Celebrex 200mg  daily. W/c for mobility when out of bed. X-ray 01/13/13 showed no new acute fracture, subluxation, or dislocation. Also showed old healed fracture deformity at the left femoral head, neck, and intertrochanteric region with orthopedic rod, plate and screws spanning form superolateral to the femoral head to the proximal shaft. Potion of the orthopedic screw appear outside the confines of the femoral head. s/p Orthopedic consultation.      Unspecified hypothyroidism Takes Levothyroxine 25mcg daily, TSH 2.741 05/20/13    Unspecified chronic bronchitis Maintained on Spiriva and Advair. Chronic cough.     Thrush, oral Improved, takes Magic mouth wash.   Neuropathy Stable on Gabapentin 100mg  tid and Tylenol 650mg  qid   HTN (hypertension) Takes Imdur 30mg  for CAD. Takes Metoprolol  to 25mg  bid, Losartan 50mg  bid,  off Clonidine 0.1mg  po qhs-controlled with occasionally elevated SBP which is asymptomatic to  her. Bradycardia is resolved.   Bps 162.33m 156/88, 121/78, [id number].  Update BMP since Losartan up to 50mg  bid 07/23/13   Dementia Gradual decline, takes Namenda and lives in [place].      Depression takes Cymbalta 30mg  bid-stable.     GERD (gastroesophageal reflux disease) Hx GI bleed, stable, Hgb11.9 07/14/11-11.6 09/29/39, off  Carafate 12/14/12 and  continue Omeprazole 40mg  bid.     CAD (coronary artery disease) of artery bypass graft Stable on Imdur 30mg . Last chest pain 06/30/13 relived with NTG and MaaLox.       Family/ Staff Communication: observe the patient.   Goals of Care: SNF  Labs/tests ordered: BMP

## 2013-09-02 LAB — BASIC METABOLIC PANEL
BUN: 19 mg/dL (ref 4–21)
Creatinine: 0.9 mg/dL (ref 0.5–1.1)
GLUCOSE: 83 mg/dL
Potassium: 4.2 mmol/L (ref 3.4–5.3)
Sodium: 140 mmol/L (ref 137–147)

## 2013-09-07 ENCOUNTER — Non-Acute Institutional Stay (SKILLED_NURSING_FACILITY): Payer: Medicare Other | Admitting: Nurse Practitioner

## 2013-09-07 ENCOUNTER — Encounter: Payer: Self-pay | Admitting: Nurse Practitioner

## 2013-09-07 DIAGNOSIS — K219 Gastro-esophageal reflux disease without esophagitis: Secondary | ICD-10-CM

## 2013-09-07 DIAGNOSIS — G629 Polyneuropathy, unspecified: Secondary | ICD-10-CM

## 2013-09-07 DIAGNOSIS — M25559 Pain in unspecified hip: Secondary | ICD-10-CM

## 2013-09-07 DIAGNOSIS — I10 Essential (primary) hypertension: Secondary | ICD-10-CM

## 2013-09-07 DIAGNOSIS — D649 Anemia, unspecified: Secondary | ICD-10-CM

## 2013-09-07 DIAGNOSIS — M25552 Pain in left hip: Secondary | ICD-10-CM

## 2013-09-07 DIAGNOSIS — G589 Mononeuropathy, unspecified: Secondary | ICD-10-CM

## 2013-09-07 DIAGNOSIS — J42 Unspecified chronic bronchitis: Secondary | ICD-10-CM

## 2013-09-07 DIAGNOSIS — E039 Hypothyroidism, unspecified: Secondary | ICD-10-CM

## 2013-09-07 DIAGNOSIS — I25810 Atherosclerosis of coronary artery bypass graft(s) without angina pectoris: Secondary | ICD-10-CM

## 2013-09-07 DIAGNOSIS — F0390 Unspecified dementia without behavioral disturbance: Secondary | ICD-10-CM

## 2013-09-07 DIAGNOSIS — F411 Generalized anxiety disorder: Secondary | ICD-10-CM

## 2013-09-07 DIAGNOSIS — J440 Chronic obstructive pulmonary disease with acute lower respiratory infection: Secondary | ICD-10-CM | POA: Insufficient documentation

## 2013-09-07 DIAGNOSIS — F419 Anxiety disorder, unspecified: Secondary | ICD-10-CM

## 2013-09-07 NOTE — Assessment & Plan Note (Signed)
takes Cymbalta 30mg  bid-stable.

## 2013-09-07 NOTE — Assessment & Plan Note (Signed)
Gradual decline, takes Namenda and lives in SNF.  

## 2013-09-07 NOTE — Assessment & Plan Note (Signed)
Takes Levothyroxine 25mcg daily, TSH 2.741 05/20/13

## 2013-09-07 NOTE — Assessment & Plan Note (Signed)
hronic since the left hip fx surgical repair 06/2010-pain is managed with Tramadol 50mg  qid,Tylenol 650mg  qid, Celebrex 200mg  daily. W/c for mobility when out of bed. X-ray 01/13/13 showed no new acute fracture, subluxation, or dislocation. Also showed old healed fracture deformity at the left femoral head, neck, and intertrochanteric region with orthopedic rod, plate and screws spanning form superolateral to the femoral head to the proximal shaft. Potion of the orthopedic screw appear outside the confines of the femoral head. s/p Orthopedic consultation.

## 2013-09-07 NOTE — Assessment & Plan Note (Signed)
Stable on Imdur 30mg. Last chest pain 06/30/13 relived with NTG and MaaLox.    

## 2013-09-07 NOTE — Assessment & Plan Note (Signed)
Stable on Gabapentin 100mg tid and Tylenol 650mg qid  

## 2013-09-07 NOTE — Assessment & Plan Note (Signed)
Hx GI bleed, stable, Hgb11.9 07/14/11-11.6 09/29/39, off  Carafate 12/14/12 and  continue Omeprazole 40mg bid.   

## 2013-09-07 NOTE — Progress Notes (Signed)
Patient ID: [name] [name], female   DOB: 06/02/1917, 80 y.o.   MRN: [id number]   Code Status: DNR  No Known Allergies  Chief Complaint  Patient presents with  . Medical Management of Chronic Issues  . Acute Visit    staff reported: the patient has congestive cough.     HPI: Patient is a 80 y.o. female [facility] for evaluation of  Congestive cough and chronic medical conditions.  Problem List Items Addressed This Visit   Anemia - Primary     Resolved, last Hgb 14.0 06/12/13      Anxiety     takes Cymbalta 30mg  bid-stable.      Bronchitis, chronic obstructive w acute bronchitis     Worsened cough and noted congestion. Stated she is feeling bad. Afebrile. No O2 desaturation or wheezes. Will treat with Mucinex 600mg  bid x 10days and Augmentin 875mg  bid for 7 days. Observe the patient.     CAD (coronary artery disease) of artery bypass graft     Stable on Imdur 30mg . Last chest pain 06/30/13 relived with NTG and MaaLox.       Dementia     Gradual decline, takes Namenda and [place]      GERD (gastroesophageal reflux disease)     Hx GI bleed, stable, Hgb11.9 07/14/11-11.6 09/29/39, off  Carafate 12/14/12 and  continue Omeprazole 40mg  bid.        HTN (hypertension)     Takes Imdur 30mg  for CAD. Takes Metoprolol 25mg  bid and Losartan 50mg  bid. BP elevated Sbp       Neuropathy     Stable on Gabapentin 100mg  tid and Tylenol 650mg  qid      Pain in left hip     hronic since the left hip fx surgical repair 06/2010-pain is managed with Tramadol 50mg  qid,Tylenol 650mg  qid, Celebrex 200mg  daily. W/c for mobility when out of bed. X-ray 01/13/13 showed no new acute fracture, subluxation, or dislocation. Also showed old healed fracture deformity at the left femoral head, neck, and intertrochanteric region with orthopedic rod, plate and screws spanning form superolateral to the femoral head to the proximal shaft. Potion of the orthopedic screw appear  outside the confines of the femoral head. s/p Orthopedic consultation.       Unspecified chronic bronchitis     Maintained on Spiriva and Advair. Chronic cough-worsened and congestive. Will add Mucinex 600mg  bid x 10day and Augmentin 875mg  bid x 7ays.     Unspecified hypothyroidism (Chronic)     Takes Levothyroxine 25mcg daily, TSH 2.741 05/20/13         Review of Systems:  Review of Systems  Constitutional: Negative for fever, chills, weight loss, malaise/fatigue and diaphoresis.  HENT: Positive for hearing loss. Negative for congestion, ear pain and sore throat.        C/o sore tongue-better with Magic mouth wash.   Eyes: Negative for pain, discharge and redness.  Respiratory: Positive for cough. Negative for sputum production, shortness of breath and wheezing.        Congestive cough.   Cardiovascular: Positive for leg swelling (trace in ankle, chronic, venous insufficiency). Negative for chest pain, orthopnea, claudication and PND.       Reported chest pain x1 06/30/13. Bradycardia. Resolved.   Gastrointestinal: Negative for heartburn, nausea, vomiting, abdominal pain, diarrhea, constipation and blood in stool.          Genitourinary: Positive for frequency. Negative for dysuria, urgency and  flank pain.  Musculoskeletal: Positive for back pain, joint pain (left hip pain. ), myalgias and neck pain. Negative for falls.  Skin: Negative for itching and rash.       BLE chronic venous insufficiency with mild erythema lower 1/3 of the BLE. A quarter size of the left stasis ulcer with yellow slough at the lateral of the middle of the right lower leg.   Neurological: Negative for dizziness, tingling, tremors, speech change, focal weakness, seizures, loss of consciousness and weakness. Sensory change: periphearl neuropathy.  Endo/Heme/Allergies: Negative for environmental allergies and polydipsia. Does not bruise/bleed easily.  Psychiatric/Behavioral: Positive for memory loss. Negative for  depression and hallucinations. The patient is not nervous/anxious and does not have insomnia.      Past Medical History  Diagnosis Date  . Hypertension   . COPD (chronic obstructive pulmonary disease)   . CHF (congestive heart failure)   . Coronary artery disease   . Renal disorder   . GERD (gastroesophageal reflux disease)   . Osteoporosis   . Syncope   . Dementia   . Anemia   . Parkinson disease   . Neuropathy   . Thyroid disease   . Hyperlipidemia   . Anxiety   . Depression   . Chronic pain 11/01/2011   Past Surgical History  Procedure Laterality Date  . Fracture surgery    . Esophagogastroduodenoscopy  05/05/2012    Procedure: ESOPHAGOGASTRODUODENOSCOPY (EGD);  Surgeon: [name], MD;  Location: [facility] ENDOSCOPY;  Service: Endoscopy;  Laterality: N/A;  Pediatric upper endoscope   Social History:   reports that she has never smoked. She does not have any smokeless tobacco history on file. She reports that she does not drink alcohol or use illicit drugs.  Medications: Patient's Medications  New Prescriptions   No medications on file  Previous Medications   ACETAMINOPHEN (TYLENOL) 325 MG TABLET    Take 650 mg by mouth 3 (three) times daily.   ACYCLOVIR (ZOVIRAX) 200 MG CAPSULE    Take 200 mg by mouth daily.   CALCIUM-VITAMIN D (OSCAL WITH D) 500-200 MG-UNIT PER TABLET    Take 1 tablet by mouth daily.   CELECOXIB (CELEBREX) 200 MG CAPSULE    Take 200 mg by mouth daily.   DULOXETINE (CYMBALTA) 30 MG CAPSULE    Take 30 mg by mouth 2 (two) times daily.   FLUTICASONE-SALMETEROL (ADVAIR) 100-50 MCG/DOSE AEPB    Inhale 1 puff into the lungs every 12 (twelve) hours.   GABAPENTIN (NEURONTIN) 100 MG CAPSULE    Take 100 mg by mouth 3 (three) times daily.   HYDROXYPROPYL METHYLCELLULOSE (ISOPTO TEARS) 2.5 % OPHTHALMIC SOLUTION    Place 1 drop into both eyes every 4 (four) hours.   ISOSORBIDE MONONITRATE (IMDUR) 30 MG 24 HR TABLET    Take 30 mg by mouth daily.   LEVOTHYROXINE  (SYNTHROID, LEVOTHROID) 25 MCG TABLET    Take 25 mcg by mouth daily.    LOSARTAN (COZAAR) 50 MG TABLET    Take 50 mg by mouth 2 (two) times daily.   MEMANTINE (NAMENDA) 5 MG TABLET    Take 14 mg by mouth daily.    METOPROLOL TARTRATE (LOPRESSOR) 25 MG TABLET    Take 25 mg by mouth 2 (two) times daily.   OMEPRAZOLE (PRILOSEC) 20 MG CAPSULE    Take 2 capsules (40 mg total) by mouth 2 (two) times daily.   TIOTROPIUM (SPIRIVA) 18 MCG INHALATION CAPSULE    Place 18 mcg into inhaler and  inhale daily.   TRAMADOL (ULTRAM) 50 MG TABLET    1 by mouth three times daily (6 am, 12pm, 6 pm, 12 midnight)  Modified Medications   No medications on file  Discontinued Medications   No medications on file     Physical Exam: Physical Exam  Constitutional: She is oriented to person, place, and time. She appears well-developed and well-nourished.  HENT:  Head: Normocephalic and atraumatic.  Inflamed tongue and scattered small white patches-improved.   Eyes: Conjunctivae and EOM are normal. Pupils are equal, round, and reactive to light.  Neck: Normal range of motion. No JVD present. No thyromegaly present.  Chronic decreased lateral ROM of the neck   Cardiovascular: Regular rhythm and normal heart sounds.   No murmur heard. HR 60s  Pulmonary/Chest: Effort normal. She has no wheezes. She has no rales.  Abdominal: Soft. Bowel sounds are normal.  Musculoskeletal: She exhibits edema and tenderness.  Mainly pain in neck and left hip  Lymphadenopathy:    She has no cervical adenopathy.  Neurological: She is alert and oriented to person, place, and time. She has normal reflexes. No cranial nerve deficit. She exhibits normal muscle tone. Coordination normal.  Skin: Skin is warm and dry. No lesion and no rash noted. No erythema.  New skin lesion at bridge of her nose, new, scaly, superficial, non healing--11/17/12 Dermatology consultation: Bowen's disease-R leg. Probable BCC nose-hold tx for now Bilateral lower  legs chronic venous dermatitis. BLE chronic venous insufficiency with mild erythema lower 1/3 of the BLE. A quarter size of the left stasis ulcer with yellow slough at the lateral of the middle of the right lower leg.     Psychiatric: Cognition and memory are impaired. She exhibits abnormal recent memory.    Filed Vitals:   09/07/13 1336  BP: 142/100  Pulse: 62  Temp: 98.6 F (37 C)  TempSrc: Tympanic  Resp: 12      Labs reviewed: Basic Metabolic Panel:  Recent Labs  16/10/96 02/25/13  05/20/13  07/08/13 07/26/13 09/02/13  NA 141  --   < >  --   < > 141 141 140  K 3.8  --   < >  --   < > 3.9 4.2 4.2  BUN 24*  --   < >  --   < > 24* 24* 19  CREATININE 0.9  --   < >  --   < > 1.3* 1.0 0.9  TSH 4.21 5.47  --  2.74  --   --   --   --   < > = values in this interval not displayed. CBC:  Recent Labs  02/25/13 06/07/13 06/12/13  WBC 10.0 11.5 6.8  HGB 13.3 13.6 14.0  HCT 41 40 42  PLT 371 267 271    Past procedures:  06/07/13 CXR borderline cardiomegaly unchanged with new minimal pulmonary vascular congestion, patchy bibasilar atelectasis or pneumonitis appears new.   Assessment/Plan Anemia Resolved, last Hgb 14.0 06/12/13    Anxiety takes Cymbalta 30mg  bid-stable.    CAD (coronary artery disease) of artery bypass graft Stable on Imdur 30mg . Last chest pain 06/30/13 relived with NTG and MaaLox.     Dementia Gradual decline, takes Namenda and lives in [place].    GERD (gastroesophageal reflux disease) Hx GI bleed, stable, Hgb11.9 07/14/11-11.6 09/29/39, off  Carafate 12/14/12 and  continue Omeprazole 40mg  bid.      HTN (hypertension) Takes Imdur 30mg  for CAD. Takes Metoprolol 25mg  bid and Losartan 50mg   bid. BP elevated Sbp     Neuropathy Stable on Gabapentin 100mg  tid and Tylenol 650mg  qid    Pain in left hip hronic since the left hip fx surgical repair 06/2010-pain is managed with Tramadol 50mg  qid,Tylenol 650mg  qid, Celebrex 200mg  daily. W/c for mobility  when out of bed. X-ray 01/13/13 showed no new acute fracture, subluxation, or dislocation. Also showed old healed fracture deformity at the left femoral head, neck, and intertrochanteric region with orthopedic rod, plate and screws spanning form superolateral to the femoral head to the proximal shaft. Potion of the orthopedic screw appear outside the confines of the femoral head. s/p Orthopedic consultation.     Unspecified hypothyroidism Takes Levothyroxine 25mcg daily, TSH 2.741 05/20/13    Unspecified chronic bronchitis Maintained on Spiriva and Advair. Chronic cough-worsened and congestive. Will add Mucinex 600mg  bid x 10day and Augmentin 875mg  bid x 7ays.   Bronchitis, chronic obstructive w acute bronchitis Worsened cough and noted congestion. Stated she is feeling bad. Afebrile. No O2 desaturation or wheezes. Will treat with Mucinex 600mg  bid x 10days and Augmentin 875mg  bid for 7 days. Observe the patient.     Family/ Staff Communication: observe the patient.   Goals of Care: SNF  Labs/tests ordered: none

## 2013-09-07 NOTE — Assessment & Plan Note (Signed)
Resolved, last Hgb 14.0 06/12/13

## 2013-09-07 NOTE — Assessment & Plan Note (Signed)
Maintained on Spiriva and Advair. Chronic cough-worsened and congestive. Will add Mucinex 600mg  bid x 10day and Augmentin 875mg  bid x 7ays.

## 2013-09-07 NOTE — Assessment & Plan Note (Signed)
Worsened cough and noted congestion. Stated she is feeling bad. Afebrile. No O2 desaturation or wheezes. Will treat with Mucinex 600mg  bid x 10days and Augmentin 875mg  bid for 7 days. Observe the patient.

## 2013-09-07 NOTE — Assessment & Plan Note (Signed)
Takes Imdur 30mg  for CAD. Takes Metoprolol 25mg  bid and Losartan 50mg  bid. BP elevated Sbp

## 2013-09-27 ENCOUNTER — Encounter: Payer: Self-pay | Admitting: *Deleted

## 2013-09-28 ENCOUNTER — Encounter: Payer: Self-pay | Admitting: Nurse Practitioner

## 2013-09-28 ENCOUNTER — Non-Acute Institutional Stay (SKILLED_NURSING_FACILITY): Payer: Medicare Other | Admitting: Nurse Practitioner

## 2013-09-28 DIAGNOSIS — J42 Unspecified chronic bronchitis: Secondary | ICD-10-CM

## 2013-09-28 DIAGNOSIS — I498 Other specified cardiac arrhythmias: Secondary | ICD-10-CM

## 2013-09-28 DIAGNOSIS — F0390 Unspecified dementia without behavioral disturbance: Secondary | ICD-10-CM

## 2013-09-28 DIAGNOSIS — G589 Mononeuropathy, unspecified: Secondary | ICD-10-CM

## 2013-09-28 DIAGNOSIS — F32A Depression, unspecified: Secondary | ICD-10-CM

## 2013-09-28 DIAGNOSIS — M25552 Pain in left hip: Secondary | ICD-10-CM

## 2013-09-28 DIAGNOSIS — R001 Bradycardia, unspecified: Secondary | ICD-10-CM

## 2013-09-28 DIAGNOSIS — E039 Hypothyroidism, unspecified: Secondary | ICD-10-CM

## 2013-09-28 DIAGNOSIS — K219 Gastro-esophageal reflux disease without esophagitis: Secondary | ICD-10-CM

## 2013-09-28 DIAGNOSIS — I25810 Atherosclerosis of coronary artery bypass graft(s) without angina pectoris: Secondary | ICD-10-CM

## 2013-09-28 DIAGNOSIS — F3289 Other specified depressive episodes: Secondary | ICD-10-CM

## 2013-09-28 DIAGNOSIS — M25559 Pain in unspecified hip: Secondary | ICD-10-CM

## 2013-09-28 DIAGNOSIS — G629 Polyneuropathy, unspecified: Secondary | ICD-10-CM

## 2013-09-28 DIAGNOSIS — I10 Essential (primary) hypertension: Secondary | ICD-10-CM

## 2013-09-28 DIAGNOSIS — F329 Major depressive disorder, single episode, unspecified: Secondary | ICD-10-CM

## 2013-09-28 MED ORDER — NEBIVOLOL HCL 10 MG PO TABS: 10.0000 mg | ORAL_TABLET | Freq: Every day | ORAL | Status: AC

## 2013-09-28 NOTE — Progress Notes (Signed)
Patient ID: [name] [name], female   DOB: Jul 20, 1917, 80 y.o.   MRN: [id number]   Code Status: DNR  No Known Allergies  Chief Complaint  Patient presents with  . Medical Management of Chronic Issues  . Acute Visit    bradycardia.     HPI: Patient is a 80 y.o. female seen in the SNF at [facility] today for evaluation of blood pressure, bradycardia,  and chronic medical conditions.  Problem List Items Addressed This Visit   Bradycardia     HR 50-60s. Asymptomatic. Switch from Metoprolol to Bystolic for better Bp control. Monitor HR.     CAD (coronary artery disease) of artery bypass graft     Stable on Imdur 30mg . Last chest pain 06/30/13 relived with NTG and MaaLox.       Relevant Medications      nebivolol (BYSTOLIC) tablet   Dementia     Gradual decline, takes Namenda and lives in [place].       Depression     takes Cymbalta 30mg  bid-stable.       GERD (gastroesophageal reflux disease)     Hx GI bleed, stable, Hgb11.9 07/14/11-11.6 09/29/39, off  Carafate 12/14/12 and  continue Omeprazole 40mg  bid.       HTN (hypertension) - Primary     Elevated Bps: 140-170/70-90. Will dc Metoprolol. Try Bystolic 10mg . Continue Losartan 50mg  bid.     Relevant Medications      nebivolol (BYSTOLIC) tablet   Neuropathy     Stable on Gabapentin 100mg  tid and Tylenol 650mg  qid      Pain in left hip     chronic since the left hip fx surgical repair 06/2010-pain is managed with Tramadol 50mg  qid,Tylenol 650mg  qid, Celebrex 200mg  daily. W/c for mobility when out of bed. X-ray 01/13/13 showed no new acute fracture, subluxation, or dislocation. Also showed old healed fracture deformity at the left femoral head, neck, and intertrochanteric region with orthopedic rod, plate and screws spanning form superolateral to the femoral head to the proximal shaft. Potion of the orthopedic screw appear outside the confines of the femoral head. s/p Orthopedic consultation.        Unspecified chronic  bronchitis     Maintained on Spiriva and Advair. Chronic cough    Unspecified hypothyroidism (Chronic)     Takes Levothyroxine daily, TSH 2.741 05/20/13       Relevant Medications      nebivolol (BYSTOLIC) tablet      Review of Systems:  Review of Systems  Constitutional: Negative for fever, chills, weight loss, malaise/fatigue and diaphoresis.  HENT: Positive for hearing loss. Negative for congestion, ear pain and sore throat.   Eyes: Negative for pain, discharge and redness.  Respiratory: Positive for cough. Negative for sputum production, shortness of breath and wheezing.        Chronic cough.   Cardiovascular: Positive for leg swelling (trace in ankle, chronic, venous insufficiency). Negative for chest pain, orthopnea, claudication and PND.       Reported chest pain x1 06/30/13. Bradycardia. 1+ edema BLE  Gastrointestinal: Negative for heartburn, nausea, vomiting, abdominal pain, diarrhea, constipation and blood in stool.          Genitourinary: Positive for frequency. Negative for dysuria, urgency and flank pain.  Musculoskeletal: Positive for back pain, joint pain (left hip pain. ), myalgias and neck pain. Negative for falls.  Skin: Negative for itching and rash.       BLE chronic venous insufficiency with mild  erythema lower 1/3 of the BLE. A quarter size of the left stasis ulcer with yellow slough at the lateral of the middle of the right lower leg.   Neurological: Negative for dizziness, tingling, tremors, speech change, focal weakness, seizures, loss of consciousness and weakness. Sensory change: periphearl neuropathy.  Endo/Heme/Allergies: Negative for environmental allergies and polydipsia. Does not bruise/bleed easily.  Psychiatric/Behavioral: Positive for memory loss. Negative for depression and hallucinations. The patient is not nervous/anxious and does not have insomnia.      Past Medical History  Diagnosis Date  . Hypertension   . COPD (chronic obstructive  pulmonary disease)   . CHF (congestive heart failure)   . Coronary artery disease   . Renal disorder   . GERD (gastroesophageal reflux disease)   . Osteoporosis   . Syncope   . Dementia   . Anemia   . Parkinson disease   . Neuropathy   . Thyroid disease   . Hyperlipidemia   . Anxiety   . Depression   . Chronic pain 11/01/2011   Past Surgical History  Procedure Laterality Date  . Fracture surgery    . Esophagogastroduodenoscopy  05/05/2012    Procedure: ESOPHAGOGASTRODUODENOSCOPY (EGD);  Surgeon: [name], MD;  Location: [facility] ENDOSCOPY;  Service: Endoscopy;  Laterality: N/A;  Pediatric upper endoscope   Social History:   reports that she has never smoked. She does not have any smokeless tobacco history on file. She reports that she does not drink alcohol or use illicit drugs.  Medications: Patient's Medications  New Prescriptions   NEBIVOLOL (BYSTOLIC) 10 MG TABLET    Take 1 tablet (10 mg total) by mouth daily.  Previous Medications   ACETAMINOPHEN (TYLENOL) 325 MG TABLET    Take 650 mg by mouth 3 (three) times daily.   ACYCLOVIR (ZOVIRAX) 200 MG CAPSULE    Take 200 mg by mouth daily.   AMOXICILLIN (AMOXIL) 500 MG CAPSULE    Take 4 capsules by mouth prior to dental procedure as directed by the access dental care staff   AMOXICILLIN-CLAVULANATE (AUGMENTIN) 875-125 MG PER TABLET    Take 1 tablet by mouth 2 (two) times daily. For 7 days starting 09/07/13   CALCIUM-VITAMIN D (OSCAL WITH D) 500-200 MG-UNIT PER TABLET    Take 1 tablet by mouth daily.   CELECOXIB (CELEBREX) 200 MG CAPSULE    Take 200 mg by mouth daily.   DULOXETINE (CYMBALTA) 30 MG CAPSULE    Take 30 mg by mouth 2 (two) times daily.   FLUTICASONE-SALMETEROL (ADVAIR) 100-50 MCG/DOSE AEPB    Inhale 1 puff into the lungs every 12 (twelve) hours.   GABAPENTIN (NEURONTIN) 100 MG CAPSULE    Take 100 mg by mouth 3 (three) times daily.   GUAIFENESIN (MUCINEX) 600 MG 12 HR TABLET    Take by mouth 2 (two) times daily. For  10 days starting 09/07/13   HYDROXYPROPYL METHYLCELLULOSE (ISOPTO TEARS) 2.5 % OPHTHALMIC SOLUTION    Place 1 drop into both eyes every 4 (four) hours.   ISOSORBIDE MONONITRATE (IMDUR) 30 MG 24 HR TABLET    Take 30 mg by mouth daily.   LEVOTHYROXINE (SYNTHROID, LEVOTHROID) 25 MCG TABLET    Take 25 mcg by mouth daily.    LOSARTAN (COZAAR) 50 MG TABLET    Take 50 mg by mouth 2 (two) times daily.   MEMANTINE (NAMENDA) 5 MG TABLET    Take 14 mg by mouth daily.    OMEPRAZOLE (PRILOSEC) 20 MG CAPSULE  Take 2 capsules (40 mg total) by mouth 2 (two) times daily.   SACCHAROMYCES BOULARDII (FLORASTOR) 250 MG CAPSULE    Take 250 mg by mouth 2 (two) times daily. For 7 days starting 09/07/13   TIOTROPIUM (SPIRIVA) 18 MCG INHALATION CAPSULE    Place 18 mcg into inhaler and inhale daily.   TRAMADOL (ULTRAM) 50 MG TABLET    1 by mouth three times daily (6 am, 12pm, 6 pm, 12 midnight)  Modified Medications   No medications on file  Discontinued Medications   METOPROLOL TARTRATE (LOPRESSOR) 25 MG TABLET    Take 25 mg by mouth 2 (two) times daily.     Physical Exam: Physical Exam  Constitutional: She is oriented to person, place, and time. She appears well-developed and well-nourished.  HENT:  Head: Normocephalic and atraumatic.  Eyes: Conjunctivae and EOM are normal. Pupils are equal, round, and reactive to light.  Neck: Normal range of motion. No JVD present. No thyromegaly present.  Chronic decreased lateral ROM of the neck   Cardiovascular: Regular rhythm and normal heart sounds.   No murmur heard. HR 50-60s  Pulmonary/Chest: Effort normal. She has no wheezes. She has no rales.  Abdominal: Soft. Bowel sounds are normal.  Musculoskeletal: She exhibits edema and tenderness.  Mainly pain in neck and left hip. 1+ edema BLE  Lymphadenopathy:    She has no cervical adenopathy.  Neurological: She is alert and oriented to person, place, and time. She has normal reflexes. No cranial nerve deficit. She  exhibits normal muscle tone. Coordination normal.  Skin: Skin is warm and dry. No lesion and no rash noted. No erythema.  New skin lesion at bridge of her nose, new, scaly, superficial, non healing--11/17/12 Dermatology consultation: Bowen's disease-R leg. 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 BLE chronic venous insufficiency with mild erythema lower 1/3 of the BLE. A quarter size of the left stasis ulcer with yellow slough at the lateral of the middle of the right lower leg.     Psychiatric: Cognition and memory are impaired. She exhibits abnormal recent memory.    Filed Vitals:   09/28/13 1545  BP: 150/73  Pulse: 59  Temp: 98.4 F (36.9 C)  TempSrc: Tympanic  Resp: 12      Labs reviewed: Basic Metabolic Panel:  Recent Labs  16/02/9609/16/14  05/20/13  07/08/13 07/26/13 09/02/13  NA  --   < >  --   < > 141 141 140  K  --   < >  --   < > 3.9 4.2 4.2  BUN  --   < >  --   < > 24* 24* 19  CREATININE  --   < >  --   < > 1.3* 1.0 0.9  TSH 5.47  --  2.74  --   --   --   --   < > = values in this interval not displayed. CBC:  Recent Labs  02/25/13 06/07/13 06/12/13  WBC 10.0 11.5 6.8  HGB 13.3 13.6 14.0  HCT 41 40 42  PLT 371 267 271    Past procedures:  06/07/13 CXR borderline cardiomegaly unchanged with new minimal pulmonary vascular congestion, patchy bibasilar atelectasis or pneumonitis appears new.   Assessment/Plan Bradycardia HR 50-60s. Asymptomatic. Switch from Metoprolol to Bystolic for better Bp control. Monitor HR.   Unspecified hypothyroidism Takes Levothyroxine 25mcg daily, TSH 2.741 05/20/13     Pain in left hip chronic since the left  hip fx surgical repair 06/2010-pain is managed with Tramadol 50mg  qid,Tylenol 650mg  qid, Celebrex 200mg  daily. W/c for mobility when out of bed. X-ray 01/13/13 showed no new acute fracture, subluxation, or dislocation. Also showed old healed fracture deformity at the left femoral head, neck, and  intertrochanteric region with orthopedic rod, plate and screws spanning form superolateral to the femoral head to the proximal shaft. Potion of the orthopedic screw appear outside the confines of the femoral head. s/p Orthopedic consultation.      CAD (coronary artery disease) of artery bypass graft Stable on Imdur 30mg . Last chest pain 06/30/13 relived with NTG and MaaLox.     Dementia Gradual decline, [place] Nam[place]     Depression takes Cymbalta 30mg  bid-stable.     Unspecified chronic bronchitis Maintained on Spiriva and Advair. Chronic cough  GERD (gastroesophageal reflux disease) Hx GI bleed, stable, Hgb11.9 07/14/11-11.6 09/29/39, off  Carafate 12/14/12 and  continue Omeprazole 40mg  bid.     Neuropathy Stable on Gabapentin 100mg  tid and Tylenol 650mg  qid    HTN (hypertension) Elevated Bps: 140-170/70-90. Will dc Metoprolol. Try Bystolic 10mg . Continue Losartan 50mg  bid.     Family/ Staff Communication: observe the patient.   Goals of Care: SNF  Labs/tests ordered: none

## 2013-09-28 NOTE — Assessment & Plan Note (Signed)
chronic since the left hip fx surgical repair 06/2010-pain is managed with Tramadol 50mg qid,Tylenol 650mg qid, Celebrex 200mg daily. W/c for mobility when out of bed. X-ray 01/13/13 showed no new acute fracture, subluxation, or dislocation. Also showed old healed fracture deformity at the left femoral head, neck, and intertrochanteric region with orthopedic rod, plate and screws spanning form superolateral to the femoral head to the proximal shaft. Potion of the orthopedic screw appear outside the confines of the femoral head. s/p Orthopedic consultation.    

## 2013-09-28 NOTE — Assessment & Plan Note (Signed)
HR 50-60s. Asymptomatic. Switch from Metoprolol to Bystolic for better Bp control. Monitor HR.

## 2013-09-28 NOTE — Assessment & Plan Note (Signed)
Takes Levothyroxine 25mcg daily, TSH 2.741 05/20/13

## 2013-09-28 NOTE — Assessment & Plan Note (Signed)
Stable on Gabapentin 100mg  tid and Tylenol 650mg  qid

## 2013-09-28 NOTE — Assessment & Plan Note (Signed)
Hx GI bleed, stable, Hgb11.9 07/14/11-11.6 09/29/39, off  Carafate 12/14/12 and  continue Omeprazole 40mg bid.   

## 2013-09-28 NOTE — Assessment & Plan Note (Signed)
Elevated Bps: 140-170/70-90. Will dc Metoprolol. Try Bystolic 10mg . Continue Losartan 50mg  bid.

## 2013-09-28 NOTE — Assessment & Plan Note (Signed)
takes Cymbalta 30mg  bid-stable.

## 2013-09-28 NOTE — Assessment & Plan Note (Signed)
Gradual decline, takes Namenda and lives in SNF.  

## 2013-09-28 NOTE — Assessment & Plan Note (Signed)
Stable on Imdur 30mg. Last chest pain 06/30/13 relived with NTG and MaaLox.    

## 2013-09-28 NOTE — Assessment & Plan Note (Signed)
Maintained on Spiriva and Advair. Chronic cough

## 2013-10-12 ENCOUNTER — Encounter: Payer: Self-pay | Admitting: Nurse Practitioner

## 2013-10-12 ENCOUNTER — Non-Acute Institutional Stay (SKILLED_NURSING_FACILITY): Payer: Medicare Other | Admitting: Nurse Practitioner

## 2013-10-12 DIAGNOSIS — J42 Unspecified chronic bronchitis: Secondary | ICD-10-CM

## 2013-10-12 DIAGNOSIS — G629 Polyneuropathy, unspecified: Secondary | ICD-10-CM

## 2013-10-12 DIAGNOSIS — F32A Depression, unspecified: Secondary | ICD-10-CM

## 2013-10-12 DIAGNOSIS — F3289 Other specified depressive episodes: Secondary | ICD-10-CM

## 2013-10-12 DIAGNOSIS — I10 Essential (primary) hypertension: Secondary | ICD-10-CM

## 2013-10-12 DIAGNOSIS — G589 Mononeuropathy, unspecified: Secondary | ICD-10-CM

## 2013-10-12 DIAGNOSIS — F0390 Unspecified dementia without behavioral disturbance: Secondary | ICD-10-CM

## 2013-10-12 DIAGNOSIS — L309 Dermatitis, unspecified: Secondary | ICD-10-CM

## 2013-10-12 DIAGNOSIS — M25552 Pain in left hip: Secondary | ICD-10-CM

## 2013-10-12 DIAGNOSIS — I25810 Atherosclerosis of coronary artery bypass graft(s) without angina pectoris: Secondary | ICD-10-CM

## 2013-10-12 DIAGNOSIS — E039 Hypothyroidism, unspecified: Secondary | ICD-10-CM

## 2013-10-12 DIAGNOSIS — F329 Major depressive disorder, single episode, unspecified: Secondary | ICD-10-CM

## 2013-10-12 DIAGNOSIS — M25559 Pain in unspecified hip: Secondary | ICD-10-CM

## 2013-10-12 DIAGNOSIS — L259 Unspecified contact dermatitis, unspecified cause: Secondary | ICD-10-CM

## 2013-10-12 NOTE — Progress Notes (Signed)
Patient ID: [name] [name], female   DOB: [birth year], 80 y.o.   MRN: [id number]   Code Status: DNR  No Known Allergies  Chief Complaint  Patient presents with  . Medical Management of Chronic Issues  . Acute Visit    lower 1/3 BLE warmth, erythema, and tenderness.     HPI: Patient is a 80 y.o. female seen in the SNF at [facility] today for evaluation of erythematous BLE and chronic medical conditions.  Problem List Items Addressed This Visit   CAD (coronary artery disease) of artery bypass graft     Stable on Imdur 30mg . Last chest pain 06/30/13 relived with NTG and MaaLox.       Dementia     Gradual decline, takes Namenda and lives in [place].       Depression     takes Cymbalta 30mg  bid-stable.       Dermatitis - Primary     Erythematous, tenderness, and heat 1/3 lower BLE-will apply Silvadene cream daily. 2 week course of Doxycycline 100mg  bid along with Florastor bid    HTN (hypertension)     Elevated Sbp, continue Bystolic 10mg  and Losartan 50mg  bid.      Neuropathy     Stable on Gabapentin 100mg  tid and Tylenol 650mg  qid      Pain in left hip     chronic since the left hip fx surgical repair 06/2010-pain is managed with Tramadol 50mg  qid,Tylenol 650mg  qid, Celebrex 200mg  daily. W/c for mobility when out of bed. X-ray 01/13/13 showed no new acute fracture, subluxation, or dislocation. Also showed old healed fracture deformity at the left femoral head, neck, and intertrochanteric region with orthopedic rod, plate and screws spanning form superolateral to the femoral head to the proximal shaft. Potion of the orthopedic screw appear outside the confines of the femoral head. s/p Orthopedic consultation.        Unspecified chronic bronchitis     Chronic cough. Observe the patient. Continue Spiriva and Advair.     Unspecified hypothyroidism (Chronic)     Takes Levothyroxine daily, TSH 2.741 05/20/13         Review of Systems:  Review of Systems    Constitutional: Negative for fever, chills, weight loss, malaise/fatigue and diaphoresis.  HENT: Positive for hearing loss. Negative for congestion, ear pain and sore throat.   Eyes: Negative for pain, discharge and redness.  Respiratory: Positive for cough. Negative for sputum production, shortness of breath and wheezing.        Chronic cough.   Cardiovascular: Positive for leg swelling (trace in ankle, chronic, venous insufficiency). Negative for chest pain, orthopnea, claudication and PND.       Reported chest pain x1 06/30/13. Bradycardia. 1+ edema BLE  Gastrointestinal: Negative for heartburn, nausea, vomiting, abdominal pain, diarrhea, constipation and blood in stool.          Genitourinary: Positive for frequency. Negative for dysuria, urgency and flank pain.  Musculoskeletal: Positive for back pain, joint pain (left hip pain. ), myalgias and neck pain. Negative for falls.  Skin: Negative for itching and rash.       BLE chronic venous insufficiency with mild erythema lower 1/3 of the BLE. A quarter size of the left stasis ulcer with yellow slough at the lateral of the middle of the right lower leg.   Neurological: Negative for dizziness, tingling, tremors, speech change, focal weakness, seizures, loss of consciousness and weakness. Sensory change: periphearl neuropathy.  Endo/Heme/Allergies: Negative for environmental allergies  and polydipsia. Does not bruise/bleed easily.  Psychiatric/Behavioral: Positive for memory loss. Negative for depression and hallucinations. The patient is not nervous/anxious and does not have insomnia.      Past Medical History  Diagnosis Date  . Hypertension   . COPD (chronic obstructive pulmonary disease)   . CHF (congestive heart failure)   . Coronary artery disease   . Renal disorder   . GERD (gastroesophageal reflux disease)   . Osteoporosis   . Syncope   . Dementia   . Anemia   . Parkinson disease   . Neuropathy   . Thyroid disease   .  Hyperlipidemia   . Anxiety   . Depression   . Chronic pain 11/01/2011   Past Surgical History  Procedure Laterality Date  . Fracture surgery    . Esophagogastroduodenoscopy  05/05/2012    Procedure: ESOPHAGOGASTRODUODENOSCOPY (EGD);  Surgeon: [name], MD;  Location: [facility] ENDOSCOPY;  Service: Endoscopy;  Laterality: N/A;  Pediatric upper endoscope   Social History:   reports that she has never smoked. She does not have any smokeless tobacco history on file. She reports that she does not drink alcohol or use illicit drugs.  Medications: Patient's Medications  New Prescriptions   No medications on file  Previous Medications   ACETAMINOPHEN (TYLENOL) 325 MG TABLET    Take 650 mg by mouth 3 (three) times daily.   ACYCLOVIR (ZOVIRAX) 200 MG CAPSULE    Take 200 mg by mouth daily.   AMOXICILLIN (AMOXIL) 500 MG CAPSULE    Take 4 capsules by mouth prior to dental procedure as directed by the access dental care staff   AMOXICILLIN-CLAVULANATE (AUGMENTIN) 875-125 MG PER TABLET    Take 1 tablet by mouth 2 (two) times daily. For 7 days starting 09/07/13   CALCIUM-VITAMIN D (OSCAL WITH D) 500-200 MG-UNIT PER TABLET    Take 1 tablet by mouth daily.   CELECOXIB (CELEBREX) 200 MG CAPSULE    Take 200 mg by mouth daily.   DULOXETINE (CYMBALTA) 30 MG CAPSULE    Take 30 mg by mouth 2 (two) times daily.   FLUTICASONE-SALMETEROL (ADVAIR) 100-50 MCG/DOSE AEPB    Inhale 1 puff into the lungs every 12 (twelve) hours.   GABAPENTIN (NEURONTIN) 100 MG CAPSULE    Take 100 mg by mouth 3 (three) times daily.   GUAIFENESIN (MUCINEX) 600 MG 12 HR TABLET    Take by mouth 2 (two) times daily. For 10 days starting 09/07/13   HYDROXYPROPYL METHYLCELLULOSE (ISOPTO TEARS) 2.5 % OPHTHALMIC SOLUTION    Place 1 drop into both eyes every 4 (four) hours.   ISOSORBIDE MONONITRATE (IMDUR) 30 MG 24 HR TABLET    Take 30 mg by mouth daily.   LEVOTHYROXINE (SYNTHROID, LEVOTHROID) 25 MCG TABLET    Take 25 mcg by mouth daily.     LOSARTAN (COZAAR) 50 MG TABLET    Take 50 mg by mouth 2 (two) times daily.   MEMANTINE (NAMENDA) 5 MG TABLET    Take 14 mg by mouth daily.    NEBIVOLOL (BYSTOLIC) 10 MG TABLET    Take 1 tablet (10 mg total) by mouth daily.   OMEPRAZOLE (PRILOSEC) 20 MG CAPSULE    Take 2 capsules (40 mg total) by mouth 2 (two) times daily.   SACCHAROMYCES BOULARDII (FLORASTOR) 250 MG CAPSULE    Take 250 mg by mouth 2 (two) times daily. For 7 days starting 09/07/13   TIOTROPIUM (SPIRIVA) 18 MCG INHALATION CAPSULE    Place 18  mcg into inhaler and inhale daily.   TRAMADOL (ULTRAM) 50 MG TABLET    1 by mouth three times daily (6 am, 12pm, 6 pm, 12 midnight)  Modified Medications   No medications on file  Discontinued Medications   No medications on file     Physical Exam: Physical Exam  Constitutional: She is oriented to person, place, and time. She appears well-developed and well-nourished.  HENT:  Head: Normocephalic and atraumatic.  Eyes: Conjunctivae and EOM are normal. Pupils are equal, round, and reactive to light.  Neck: Normal range of motion. No JVD present. No thyromegaly present.  Chronic decreased lateral ROM of the neck   Cardiovascular: Regular rhythm and normal heart sounds.   No murmur heard. HR 50-60s  Pulmonary/Chest: Effort normal. She has no wheezes. She has no rales.  Abdominal: Soft. Bowel sounds are normal.  Musculoskeletal: She exhibits edema and tenderness.  Mainly pain in neck and left hip. 1+ edema BLE  Lymphadenopathy:    She has no cervical adenopathy.  Neurological: She is alert and oriented to person, place, and time. She has normal reflexes. No cranial nerve deficit. She exhibits normal muscle tone. Coordination normal.  Skin: Skin is warm and dry. No lesion and no rash noted. No erythema.  New skin lesion at bridge of her nose, new, scaly, superficial, non healing--11/17/12 Dermatology consultation: Bowen's disease-R leg. Probable BCC nose-hold tx for now Bilateral lower  legs chronic venous dermatitis. BLE chronic venous insufficiency with mild erythema lower 1/3 of the BLE. A quarter size of the left stasis ulcer with yellow slough at the lateral of the middle of the right lower leg.     Psychiatric: Cognition and memory are impaired. She exhibits abnormal recent memory.    Filed Vitals:   10/12/13 1612  BP: 164/68  Pulse: 55  Temp: 97 F (36.1 C)  TempSrc: Tympanic  Resp: 20      Labs reviewed: Basic Metabolic Panel:  Recent Labs  16/10/96  05/20/13  07/08/13 07/26/13 09/02/13  NA  --   < >  --   < > 141 141 140  K  --   < >  --   < > 3.9 4.2 4.2  BUN  --   < >  --   < > 24* 24* 19  CREATININE  --   < >  --   < > 1.3* 1.0 0.9  TSH 5.47  --  2.74  --   --   --   --   < > = values in this interval not displayed. CBC:  Recent Labs  02/25/13 06/07/13 06/12/13  WBC 10.0 11.5 6.8  HGB 13.3 13.6 14.0  HCT 41 40 42  PLT 371 267 271    Past procedures:  06/07/13 CXR borderline cardiomegaly unchanged with new minimal pulmonary vascular congestion, patchy bibasilar atelectasis or pneumonitis appears new.   Assessment/Plan Dermatitis Erythematous, tenderness, and heat 1/3 lower BLE-will apply Silvadene cream daily. 2 week course of Doxycycline 100mg  bid along with Florastor bid  Unspecified hypothyroidism Takes Levothyroxine daily, TSH 2.741 05/20/13    HTN (hypertension) Elevated Sbp, continue Bystolic 10mg  and Losartan 50mg  bid.    Pain in left hip chronic since the left hip fx surgical repair 06/2010-pain is managed with Tramadol 50mg  qid,Tylenol 650mg  qid, Celebrex 200mg  daily. W/c for mobility when out of bed. X-ray 01/13/13 showed no new acute fracture, subluxation, or dislocation. Also showed old healed fracture deformity at the left femoral head,  neck, and intertrochanteric region with orthopedic rod, plate and screws spanning form superolateral to the femoral head to the proximal shaft. Potion of the orthopedic screw appear  outside the confines of the femoral head. s/p Orthopedic consultation.      CAD (coronary artery disease) of artery bypass graft Stable on Imdur 30mg . Last chest pain 06/30/13 relived with NTG and MaaLox.     Dementia Gradual decline, takes Namenda [place]     Unspecified chronic bronchitis Chronic cough. Observe the patient. Continue Spiriva and Advair.   Depression takes Cymbalta 30mg  bid-stable.     Neuropathy Stable on Gabapentin 100mg  tid and Tylenol 650mg  qid      Family/ Staff Communication: observe the patient.   Goals of Care: SNF  Labs/tests ordered: none

## 2013-10-12 NOTE — Assessment & Plan Note (Signed)
chronic since the left hip fx surgical repair 06/2010-pain is managed with Tramadol 50mg qid,Tylenol 650mg qid, Celebrex 200mg daily. W/c for mobility when out of bed. X-ray 01/13/13 showed no new acute fracture, subluxation, or dislocation. Also showed old healed fracture deformity at the left femoral head, neck, and intertrochanteric region with orthopedic rod, plate and screws spanning form superolateral to the femoral head to the proximal shaft. Potion of the orthopedic screw appear outside the confines of the femoral head. s/p Orthopedic consultation.    

## 2013-10-12 NOTE — Assessment & Plan Note (Signed)
Stable on Gabapentin 100mg tid and Tylenol 650mg qid  

## 2013-10-12 NOTE — Assessment & Plan Note (Signed)
Gradual decline, takes Namenda and lives in SNF.  

## 2013-10-12 NOTE — Assessment & Plan Note (Signed)
takes Cymbalta 30mg  bid-stable.

## 2013-10-12 NOTE — Assessment & Plan Note (Signed)
Takes Levothyroxine daily, TSH 2.741 05/20/13

## 2013-10-12 NOTE — Assessment & Plan Note (Signed)
Elevated Sbp, continue Bystolic 10mg  and Losartan 50mg  bid.

## 2013-10-12 NOTE — Assessment & Plan Note (Signed)
Chronic cough. Observe the patient. Continue Spiriva and Advair.

## 2013-10-12 NOTE — Assessment & Plan Note (Signed)
Stable on Imdur 30mg. Last chest pain 06/30/13 relived with NTG and MaaLox.    

## 2013-10-12 NOTE — Assessment & Plan Note (Signed)
Erythematous, tenderness, and heat 1/3 lower BLE-will apply Silvadene cream daily. 2 week course of Doxycycline 100mg  bid along with Florastor bid

## 2013-10-15 ENCOUNTER — Non-Acute Institutional Stay (SKILLED_NURSING_FACILITY): Payer: Medicare Other | Admitting: Nurse Practitioner

## 2013-10-15 ENCOUNTER — Encounter: Payer: Self-pay | Admitting: Nurse Practitioner

## 2013-10-15 DIAGNOSIS — E039 Hypothyroidism, unspecified: Secondary | ICD-10-CM

## 2013-10-15 DIAGNOSIS — F329 Major depressive disorder, single episode, unspecified: Secondary | ICD-10-CM

## 2013-10-15 DIAGNOSIS — F3289 Other specified depressive episodes: Secondary | ICD-10-CM

## 2013-10-15 DIAGNOSIS — K219 Gastro-esophageal reflux disease without esophagitis: Secondary | ICD-10-CM

## 2013-10-15 DIAGNOSIS — G629 Polyneuropathy, unspecified: Secondary | ICD-10-CM

## 2013-10-15 DIAGNOSIS — F0390 Unspecified dementia without behavioral disturbance: Secondary | ICD-10-CM

## 2013-10-15 DIAGNOSIS — I5032 Chronic diastolic (congestive) heart failure: Secondary | ICD-10-CM | POA: Insufficient documentation

## 2013-10-15 DIAGNOSIS — G589 Mononeuropathy, unspecified: Secondary | ICD-10-CM

## 2013-10-15 DIAGNOSIS — F32A Depression, unspecified: Secondary | ICD-10-CM

## 2013-10-15 DIAGNOSIS — I509 Heart failure, unspecified: Secondary | ICD-10-CM

## 2013-10-15 DIAGNOSIS — G8929 Other chronic pain: Secondary | ICD-10-CM

## 2013-10-15 DIAGNOSIS — R112 Nausea with vomiting, unspecified: Secondary | ICD-10-CM | POA: Insufficient documentation

## 2013-10-15 DIAGNOSIS — I10 Essential (primary) hypertension: Secondary | ICD-10-CM

## 2013-10-15 DIAGNOSIS — I25810 Atherosclerosis of coronary artery bypass graft(s) without angina pectoris: Secondary | ICD-10-CM

## 2013-10-15 DIAGNOSIS — J440 Chronic obstructive pulmonary disease with acute lower respiratory infection: Secondary | ICD-10-CM

## 2013-10-15 NOTE — Assessment & Plan Note (Addendum)
O2 desaturation, cough, BLE edema. CXR 10/14/13 mild to moderate congestive heart failure. Furosemide 20mg  x 1 10/13/13. O2 2lpm [place], Neb--will add Furosemide 10mg  Kcl daily, update BMP and BNP. Observe.

## 2013-10-15 NOTE — Assessment & Plan Note (Signed)
Elevated Sbp, continue Bystolic 10mg and Losartan 50mg bid.   

## 2013-10-15 NOTE — Assessment & Plan Note (Signed)
Stable on Gabapentin 100mg  tid and Tylenol 650mg  qid

## 2013-10-15 NOTE — Assessment & Plan Note (Signed)
Hx GI bleed, stable, Hgb11.9 07/14/11-11.6 09/29/39, off  Carafate 12/14/12 and  continue Omeprazole 40mg bid.   

## 2013-10-15 NOTE — Assessment & Plan Note (Signed)
chronic since the left hip fx surgical repair 06/2010-pain is managed with Tramadol 50mg qid,Tylenol 650mg qid, Celebrex 200mg daily. W/c for mobility when out of bed. X-ray 01/13/13 showed no new acute fracture, subluxation, or dislocation. Also showed old healed fracture deformity at the left femoral head, neck, and intertrochanteric region with orthopedic rod, plate and screws spanning form superolateral to the femoral head to the proximal shaft. Potion of the orthopedic screw appear outside the confines of the femoral head. s/p Orthopedic consultation.    

## 2013-10-15 NOTE — Progress Notes (Signed)
Patient ID: [name] [name], female   DOB: Jan 02, 1918, 80 y.o.   MRN: [id number]   Code Status: DNR  No Known Allergies  Chief Complaint  Patient presents with  . Medical Management of Chronic Issues  . Acute Visit    vomited x1 10/15/13, CHF, BLE cellulitis.     HPI: Patient is a 80 y.o. female seen in the SNF at [facility] today for evaluation of CHF, vomited x1 10/15/13, BLE cellulitis,  and chronic medical conditions.  Problem List Items Addressed This Visit   Unspecified hypothyroidism (Chronic)     akes Levothyroxine daily, TSH 2.741 05/20/13     Neuropathy     Stable on Gabapentin 100mg  tid and Tylenol 650mg  qid      Nausea with vomiting     X1. Resolved w/o intervention. Denied abd pain, diarrhea, or constipation. Most likely side effect of ABT-continue to observe.     HTN (hypertension)     Elevated Sbp, continue Bystolic 10mg  and Losartan 50mg  bid.       GERD (gastroesophageal reflux disease)     Hx GI bleed, stable, Hgb11.9 07/14/11-11.6 09/29/39, off  Carafate 12/14/12 and  continue Omeprazole 40mg  bid.    Depression     takes Cymbalta 30mg  bid-stable.        Dementia     Gradual decline, takes Namenda and lives in [place].        Chronic pain (Chronic)     chronic since the left hip fx surgical repair 06/2010-pain is managed with Tramadol 50mg  qid,Tylenol 650mg  qid, Celebrex 200mg  daily. W/c for mobility when out of bed. X-ray 01/13/13 showed no new acute fracture, subluxation, or dislocation. Also showed old healed fracture deformity at the left femoral head, neck, and intertrochanteric region with orthopedic rod, plate and screws spanning form superolateral to the femoral head to the proximal shaft. Potion of the orthopedic screw appear outside the confines of the femoral head. s/p Orthopedic consultation.         CHF (congestive heart failure) - Primary     O2 desaturation, cough, BLE edema. CXR 10/14/13 mild to moderate congestive heart failure.  Furosemide 20mg  x 1 10/13/13. O2 2lpm [place], [place]--will add Furosemide 10mg  Kcl daily, update BMP and BNP. Observe.     CAD (coronary artery disease) of artery bypass graft     Stable on Imdur 30mg . Last chest pain 06/30/13 relived with NTG and MaaLox.      Bronchitis, chronic obstructive w acute bronchitis     Chronic cough. Currently she is taking Doxy for BLE cellulitis.        Review of Systems:  Review of Systems  Constitutional: Negative for fever, chills, weight loss, malaise/fatigue and diaphoresis.  HENT: Positive for hearing loss. Negative for congestion, ear pain and sore throat.   Eyes: Negative for pain, discharge and redness.  Respiratory: Positive for cough. Negative for sputum production, shortness of breath and wheezing.        Chronic cough.   Cardiovascular: Positive for leg swelling (trace in ankle, chronic, venous insufficiency). Negative for chest pain, orthopnea, claudication and PND.       Reported chest pain x1 06/30/13. Bradycardia. 1+ edema BLE  Gastrointestinal: Negative for heartburn, nausea, vomiting, abdominal pain, diarrhea, constipation and blood in stool.          Genitourinary: Positive for frequency. Negative for dysuria, urgency and flank pain.  Musculoskeletal: Positive for back pain, joint pain (left hip pain. ), myalgias and  neck pain. Negative for falls.  Skin: Negative for itching and rash.       BLE chronic venous insufficiency with mild erythema lower 1/3 of the BLE. A quarter size of the left stasis ulcer with yellow slough at the lateral of the middle of the right lower leg.   Neurological: Negative for dizziness, tingling, tremors, speech change, focal weakness, seizures, loss of consciousness and weakness. Sensory change: periphearl neuropathy.  Endo/Heme/Allergies: Negative for environmental allergies and polydipsia. Does not bruise/bleed easily.  Psychiatric/Behavioral: Positive for memory loss. Negative for depression and hallucinations.  The patient is not nervous/anxious and does not have insomnia.      Past Medical History  Diagnosis Date  . Hypertension   . COPD (chronic obstructive pulmonary disease)   . CHF (congestive heart failure)   . Coronary artery disease   . Renal disorder   . GERD (gastroesophageal reflux disease)   . Osteoporosis   . Syncope   . Dementia   . Anemia   . Parkinson disease   . Neuropathy   . Thyroid disease   . Hyperlipidemia   . Anxiety   . Depression   . Chronic pain 11/01/2011   Past Surgical History  Procedure Laterality Date  . Fracture surgery    . Esophagogastroduodenoscopy  05/05/2012    Procedure: ESOPHAGOGASTRODUODENOSCOPY (EGD);  Surgeon: [name], MD;  Location: [facility] ENDOSCOPY;  Service: Endoscopy;  Laterality: N/A;  Pediatric upper endoscope   Social History:   reports that she has never smoked. She does not have any smokeless tobacco history on file. She reports that she does not drink alcohol or use illicit drugs.  Medications: Patient's Medications  New Prescriptions   No medications on file  Previous Medications   ACETAMINOPHEN (TYLENOL) 325 MG TABLET    Take 650 mg by mouth 3 (three) times daily.   ACYCLOVIR (ZOVIRAX) 200 MG CAPSULE    Take 200 mg by mouth daily.   AMOXICILLIN (AMOXIL) 500 MG CAPSULE    Take 4 capsules by mouth prior to dental procedure as directed by the access dental care staff   AMOXICILLIN-CLAVULANATE (AUGMENTIN) 875-125 MG PER TABLET    Take 1 tablet by mouth 2 (two) times daily. For 7 days starting 09/07/13   CALCIUM-VITAMIN D (OSCAL WITH D) 500-200 MG-UNIT PER TABLET    Take 1 tablet by mouth daily.   CELECOXIB (CELEBREX) 200 MG CAPSULE    Take 200 mg by mouth daily.   DULOXETINE (CYMBALTA) 30 MG CAPSULE    Take 30 mg by mouth 2 (two) times daily.   FLUTICASONE-SALMETEROL (ADVAIR) 100-50 MCG/DOSE AEPB    Inhale 1 puff into the lungs every 12 (twelve) hours.   GABAPENTIN (NEURONTIN) 100 MG CAPSULE    Take 100 mg by mouth 3 (three)  times daily.   GUAIFENESIN (MUCINEX) 600 MG 12 HR TABLET    Take by mouth 2 (two) times daily. For 10 days starting 09/07/13   HYDROXYPROPYL METHYLCELLULOSE (ISOPTO TEARS) 2.5 % OPHTHALMIC SOLUTION    Place 1 drop into both eyes every 4 (four) hours.   ISOSORBIDE MONONITRATE (IMDUR) 30 MG 24 HR TABLET    Take 30 mg by mouth daily.   LEVOTHYROXINE (SYNTHROID, LEVOTHROID) 25 MCG TABLET    Take 25 mcg by mouth daily.    LOSARTAN (COZAAR) 50 MG TABLET    Take 50 mg by mouth 2 (two) times daily.   MEMANTINE (NAMENDA) 5 MG TABLET    Take 14 mg by mouth daily.  NEBIVOLOL (BYSTOLIC) 10 MG TABLET    Take 1 tablet (10 mg total) by mouth daily.   OMEPRAZOLE (PRILOSEC) 20 MG CAPSULE    Take 2 capsules (40 mg total) by mouth 2 (two) times daily.   SACCHAROMYCES BOULARDII (FLORASTOR) 250 MG CAPSULE    Take 250 mg by mouth 2 (two) times daily. For 7 days starting 09/07/13   TIOTROPIUM (SPIRIVA) 18 MCG INHALATION CAPSULE    Place 18 mcg into inhaler and inhale daily.   TRAMADOL (ULTRAM) 50 MG TABLET    1 by mouth three times daily (6 am, 12pm, 6 pm, 12 midnight)  Modified Medications   No medications on file  Discontinued Medications   No medications on file     Physical Exam: Physical Exam  Constitutional: She is oriented to person, place, and time. She appears well-developed and well-nourished.  HENT:  Head: Normocephalic and atraumatic.  Eyes: Conjunctivae and EOM are normal. Pupils are equal, round, and reactive to light.  Neck: Normal range of motion. No JVD present. No thyromegaly present.  Chronic decreased lateral ROM of the neck   Cardiovascular: Regular rhythm and normal heart sounds.   No murmur heard. HR 50-60s  Pulmonary/Chest: Effort normal. She has no wheezes. She has no rales.  Abdominal: Soft. Bowel sounds are normal.  Musculoskeletal: She exhibits edema and tenderness.  Mainly pain in neck and left hip. 1+ edema BLE  Lymphadenopathy:    She has no cervical adenopathy.    Neurological: She is alert and oriented to person, place, and time. She has normal reflexes. No cranial nerve deficit. She exhibits normal muscle tone. Coordination normal.  Skin: Skin is warm and dry. No lesion and no rash noted. No erythema.  New skin lesion at bridge of her nose, new, scaly, superficial, non healing--11/17/12 Dermatology consultation: Bowen's disease-R leg. 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 BLE chronic venous insufficiency with mild erythema lower 1/3 of the BLE. A quarter size of the left stasis ulcer with yellow slough at the lateral of the middle of the right lower leg.     Psychiatric: Cognition and memory are impaired. She exhibits abnormal recent memory.    Filed Vitals:   10/15/13 1358  BP: 140/90  Pulse: 58  Temp: 97.7 F (36.5 C)  TempSrc: Tympanic  Resp: 20  SpO2: 94%      Labs reviewed: Basic Metabolic Panel:  Recent Labs  49/67/59  05/20/13  07/08/13 07/26/13 09/02/13  NA  --   < >  --   < > 141 141 140  K  --   < >  --   < > 3.9 4.2 4.2  BUN  --   < >  --   < > 24* 24* 19  CREATININE  --   < >  --   < > 1.3* 1.0 0.9  TSH 5.47  --  2.74  --   --   --   --   < > = values in this interval not displayed. CBC:  Recent Labs  02/25/13 06/07/13 06/12/13  WBC 10.0 11.5 6.8  HGB 13.3 13.6 14.0  HCT 41 40 42  PLT 371 267 271    Past procedures:  06/07/13 CXR borderline cardiomegaly unchanged with new minimal pulmonary vascular congestion, patchy bibasilar atelectasis or pneumonitis appears new.   Assessment/Plan CHF (congestive heart failure) O2 desaturation, cough, BLE edema. CXR 10/14/13 mild to moderate congestive heart failure. Furosemide 20mg  x 1  10/13/13. O2 2lpm [place], [place]--will add Furosemide 10mg  Kcl 10meq daily, update BMP and BNP. Observe.   Bronchitis, chronic obstructive w acute bronchitis Chronic cough. Currently she is taking Doxy for BLE cellulitis.   GERD (gastroesophageal reflux  disease) Hx GI bleed, stable, Hgb11.9 07/14/11-11.6 09/29/39, off  Carafate 12/14/12 and  continue Omeprazole 40mg  bid.  Unspecified hypothyroidism akes Levothyroxine 25mcg daily, TSH 2.741 05/20/13   HTN (hypertension) Elevated Sbp, continue Bystolic 10mg  and Losartan 50mg  bid.     CAD (coronary artery disease) of artery bypass graft Stable on Imdur 30mg . Last chest pain 06/30/13 relived with NTG and MaaLox.    Depression takes Cymbalta 30mg  bid-stable.      Dementia Gradual decline, [place] Nam[place]      Chronic pain chronic since the left hip fx surgical repair 06/2010-pain is managed with Tramadol 50mg  qid,Tylenol 650mg  qid, Celebrex 200mg  daily. W/c for mobility when out of bed. X-ray 01/13/13 showed no new acute fracture, subluxation, or dislocation. Also showed old healed fracture deformity at the left femoral head, neck, and intertrochanteric region with orthopedic rod, plate and screws spanning form superolateral to the femoral head to the proximal shaft. Potion of the orthopedic screw appear outside the confines of the femoral head. s/p Orthopedic consultation.       Neuropathy Stable on Gabapentin 100mg  tid and Tylenol 650mg  qid    Nausea with vomiting X1. Resolved w/o intervention. Denied abd pain, diarrhea, or constipation. Most likely side effect of ABT-continue to observe.     Family/ Staff Communication: observe the patient.   Goals of Care: SNF  Labs/tests ordered: BMP and BNP

## 2013-10-15 NOTE — Assessment & Plan Note (Signed)
Gradual decline, takes Namenda and lives in SNF.  

## 2013-10-15 NOTE — Assessment & Plan Note (Signed)
takes Cymbalta 30mg bid-stable.    

## 2013-10-15 NOTE — Assessment & Plan Note (Signed)
Stable on Imdur 30mg. Last chest pain 06/30/13 relived with NTG and MaaLox.    

## 2013-10-15 NOTE — Assessment & Plan Note (Signed)
X1. Resolved w/o intervention. Denied abd pain, diarrhea, or constipation. Most likely side effect of ABT-continue to observe.

## 2013-10-15 NOTE — Assessment & Plan Note (Signed)
Chronic cough. Currently she is taking Doxy for BLE cellulitis.

## 2013-10-15 NOTE — Assessment & Plan Note (Signed)
akes Levothyroxine daily, TSH 2.741 05/20/13

## 2013-10-18 LAB — BASIC METABOLIC PANEL
BUN: 25 mg/dL — AB (ref 4–21)
Creatinine: 1 mg/dL (ref 0.5–1.1)
GLUCOSE: 82 mg/dL
POTASSIUM: 3.9 mmol/L (ref 3.4–5.3)
SODIUM: 138 mmol/L (ref 137–147)

## 2013-10-19 ENCOUNTER — Other Ambulatory Visit: Payer: Self-pay | Admitting: Nurse Practitioner

## 2013-10-19 ENCOUNTER — Non-Acute Institutional Stay (SKILLED_NURSING_FACILITY): Payer: Medicare Other | Admitting: Nurse Practitioner

## 2013-10-19 DIAGNOSIS — J42 Unspecified chronic bronchitis: Secondary | ICD-10-CM

## 2013-10-19 DIAGNOSIS — I25810 Atherosclerosis of coronary artery bypass graft(s) without angina pectoris: Secondary | ICD-10-CM

## 2013-10-19 DIAGNOSIS — F0390 Unspecified dementia without behavioral disturbance: Secondary | ICD-10-CM

## 2013-10-19 DIAGNOSIS — R112 Nausea with vomiting, unspecified: Secondary | ICD-10-CM

## 2013-10-19 DIAGNOSIS — K219 Gastro-esophageal reflux disease without esophagitis: Secondary | ICD-10-CM

## 2013-10-19 DIAGNOSIS — K5900 Constipation, unspecified: Secondary | ICD-10-CM | POA: Insufficient documentation

## 2013-10-19 DIAGNOSIS — I10 Essential (primary) hypertension: Secondary | ICD-10-CM

## 2013-10-19 DIAGNOSIS — M25552 Pain in left hip: Secondary | ICD-10-CM

## 2013-10-19 DIAGNOSIS — M25559 Pain in unspecified hip: Secondary | ICD-10-CM

## 2013-10-19 DIAGNOSIS — G629 Polyneuropathy, unspecified: Secondary | ICD-10-CM

## 2013-10-19 DIAGNOSIS — G589 Mononeuropathy, unspecified: Secondary | ICD-10-CM

## 2013-10-19 DIAGNOSIS — I509 Heart failure, unspecified: Secondary | ICD-10-CM

## 2013-10-19 DIAGNOSIS — F411 Generalized anxiety disorder: Secondary | ICD-10-CM

## 2013-10-19 DIAGNOSIS — F419 Anxiety disorder, unspecified: Secondary | ICD-10-CM

## 2013-10-19 DIAGNOSIS — E039 Hypothyroidism, unspecified: Secondary | ICD-10-CM

## 2013-10-19 HISTORY — DX: Constipation, unspecified: K59.00

## 2013-10-19 MED ORDER — SENNOSIDES-DOCUSATE SODIUM 8.6-50 MG PO TABS: 1.0000 | ORAL_TABLET | Freq: Two times a day (BID) | ORAL | Status: AC

## 2013-10-19 NOTE — Assessment & Plan Note (Signed)
Improved O2 desaturation, cough, BLE edema. CXR 10/14/13 mild to moderate congestive heart failure. 10/18/13 BNP 502.8. Continue Furosemide 10mg  Kcl daily. Obtain Echocardiogram to evaluate further.

## 2013-10-19 NOTE — Progress Notes (Signed)
Patient ID: Randolm Idol, female   DOB: 06/12/1917, 80 y.o.   MRN: [id number]   Code Status: DNR  No Known Allergies  Chief Complaint  Patient presents with  . Medical Management of Chronic Issues  . Acute Visit    CHF, Bp    HPI: Patient is a 80 y.o. female seen in the SNF at [facility] today for evaluation of CHF, elevated Sbp,  and chronic medical conditions.  Problem List Items Addressed This Visit   Unspecified hypothyroidism (Chronic)     takes Levothyroxine daily, TSH 2.741 05/20/13. Update TSH      Unspecified constipation     10/18/13 Senokot S bid. Observe.     Unspecified chronic bronchitis     Chronic cough. Recent developing CHF aggravated her chronic cough. It should be better since Furosemide.      Pain in left hip     chronic since the left hip fx surgical repair 06/2010-pain is managed with Tramadol 50mg  qid,Tylenol 650mg  qid, Celebrex 200mg  daily. W/c for mobility when out of bed. X-ray 01/13/13 showed no new acute fracture, subluxation, or dislocation. Also showed old healed fracture deformity at the left femoral head, neck, and intertrochanteric region with orthopedic rod, plate and screws spanning form superolateral to the femoral head to the proximal shaft. Potion of the orthopedic screw appear outside the confines of the femoral head. s/p Orthopedic consultation.         Neuropathy     Stable on Gabapentin 100mg  tid and Tylenol 650mg  qid       Nausea with vomiting - Primary     Resolved.     HTN (hypertension)     Elevated Sbp, denied chest pain, headaches, vision changes, or dizziness. Continue Bystolic 10mg  and Losartan 50mg  bid. Added Furosemide should help blood pressure. Continue to observe.      GERD (gastroesophageal reflux disease)     Hx GI bleed, stable, Hgb11.9 07/14/11-11.6 09/29/39, off  Carafate 12/14/12 and  continue Omeprazole 40mg  bid.     Dementia     Gradual decline, takes Namenda and lives in [place].         CHF  (congestive heart failure)     Improved O2 desaturation, cough, BLE edema. CXR 10/14/13 mild to moderate congestive heart failure. 10/18/13 BNP 502.8. Continue Furosemide 10mg  Kcl daily. Obtain Echocardiogram to evaluate further.      CAD (coronary artery disease) of artery bypass graft     Stable on Imdur 30mg . Last chest pain 06/30/13 relived with NTG and MaaLox.       Anxiety     takes Cymbalta 30mg  bid-stable.          Review of Systems:  Review of Systems  Constitutional: Positive for malaise/fatigue. Negative for fever, chills, weight loss and diaphoresis.  HENT: Positive for hearing loss. Negative for congestion, ear pain and sore throat.   Eyes: Negative for pain, discharge and redness.  Respiratory: Positive for cough. Negative for sputum production, shortness of breath and wheezing.        Chronic cough.   Cardiovascular: Positive for leg swelling (trace in ankle, chronic, venous insufficiency). Negative for chest pain, orthopnea, claudication and PND.       Reported chest pain x1 06/30/13. Trace edema BLE  Gastrointestinal: Negative for heartburn, nausea, vomiting, abdominal pain, diarrhea, constipation and blood in stool.          Genitourinary: Positive for frequency. Negative for dysuria, urgency and flank pain.  Musculoskeletal: Positive for back pain, joint pain (left hip pain. ), myalgias and neck pain. Negative for falls.  Skin: Negative for itching and rash.       BLE chronic venous insufficiency with mild erythema lower 1/3 of the BLE. A quarter size of the left stasis ulcer with yellow slough at the lateral of the middle of the right lower leg.   Neurological: Negative for dizziness, tingling, tremors, speech change, focal weakness, seizures, loss of consciousness and weakness. Sensory change: periphearl neuropathy.  Endo/Heme/Allergies: Negative for environmental allergies and polydipsia. Does not bruise/bleed easily.  Psychiatric/Behavioral: Positive for  memory loss. Negative for depression and hallucinations. The patient is not nervous/anxious and does not have insomnia.      Past Medical History  Diagnosis Date  . Hypertension   . COPD (chronic obstructive pulmonary disease)   . CHF (congestive heart failure)   . Coronary artery disease   . Renal disorder   . GERD (gastroesophageal reflux disease)   . Osteoporosis   . Syncope   . Dementia   . Anemia   . Parkinson disease   . Neuropathy   . Thyroid disease   . Hyperlipidemia   . Anxiety   . Depression   . Chronic pain 11/01/2011   Past Surgical History  Procedure Laterality Date  . Fracture surgery    . Esophagogastroduodenoscopy  05/05/2012    Procedure: ESOPHAGOGASTRODUODENOSCOPY (EGD);  Surgeon: Florencia Reasonsobert V Buccini, MD;  [facility] END[facility];  Service: Endoscopy;  Laterality: N/A;  Pediatric upper endoscope   Social History:   reports that she has never smoked. She does not have any smokeless tobacco history on file. She reports that she does not drink alcohol or use illicit drugs.  Medications: Patient's Medications  New Prescriptions   No medications on file  Previous Medications   ACETAMINOPHEN (TYLENOL) 325 MG TABLET    Take 650 mg by mouth 3 (three) times daily.   ACYCLOVIR (ZOVIRAX) 200 MG CAPSULE    Take 200 mg by mouth daily.   AMOXICILLIN (AMOXIL) 500 MG CAPSULE    Take 4 capsules by mouth prior to dental procedure as directed by the access dental care staff   AMOXICILLIN-CLAVULANATE (AUGMENTIN) 875-125 MG PER TABLET    Take 1 tablet by mouth 2 (two) times daily. For 7 days starting 09/07/13   CALCIUM-VITAMIN D (OSCAL WITH D) 500-200 MG-UNIT PER TABLET    Take 1 tablet by mouth daily.   CELECOXIB (CELEBREX) 200 MG CAPSULE    Take 200 mg by mouth daily.   DULOXETINE (CYMBALTA) 30 MG CAPSULE    Take 30 mg by mouth 2 (two) times daily.   FLUTICASONE-SALMETEROL (ADVAIR) 100-50 MCG/DOSE AEPB    Inhale 1 puff into the lungs every 12 (twelve) hours.   GABAPENTIN  (NEURONTIN) 100 MG CAPSULE    Take 100 mg by mouth 3 (three) times daily.   GUAIFENESIN (MUCINEX) 600 MG 12 HR TABLET    Take by mouth 2 (two) times daily. For 10 days starting 09/07/13   HYDROXYPROPYL METHYLCELLULOSE (ISOPTO TEARS) 2.5 % OPHTHALMIC SOLUTION    Place 1 drop into both eyes every 4 (four) hours.   ISOSORBIDE MONONITRATE (IMDUR) 30 MG 24 HR TABLET    Take 30 mg by mouth daily.   LEVOTHYROXINE (SYNTHROID, LEVOTHROID) 25 MCG TABLET    Take 25 mcg by mouth daily.    LOSARTAN (COZAAR) 50 MG TABLET    Take 50 mg by mouth 2 (two) times daily.   MEMANTINE (  NAMENDA) 5 MG TABLET    Take 14 mg by mouth daily.    NEBIVOLOL (BYSTOLIC) 10 MG TABLET    Take 1 tablet (10 mg total) by mouth daily.   OMEPRAZOLE (PRILOSEC) 20 MG CAPSULE    Take 2 capsules (40 mg total) by mouth 2 (two) times daily.   SACCHAROMYCES BOULARDII (FLORASTOR) 250 MG CAPSULE    Take 250 mg by mouth 2 (two) times daily. For 7 days starting 09/07/13   SENNA-DOCUSATE (SENOKOT S) 8.6-50 MG PER TABLET    Take 1 tablet by mouth 2 (two) times daily.   TIOTROPIUM (SPIRIVA) 18 MCG INHALATION CAPSULE    Place 18 mcg into inhaler and inhale daily.   TRAMADOL (ULTRAM) 50 MG TABLET    1 by mouth three times daily (6 am, 12pm, 6 pm, 12 midnight)  Modified Medications   No medications on file  Discontinued Medications   No medications on file     Physical Exam: Physical Exam  Constitutional: She is oriented to person, place, and time. She appears well-developed and well-nourished.  HENT:  Head: Normocephalic and atraumatic.  Eyes: Conjunctivae and EOM are normal. Pupils are equal, round, and reactive to light.  Neck: Normal range of motion. No JVD present. No thyromegaly present.  Chronic decreased lateral ROM of the neck   Cardiovascular: Regular rhythm and normal heart sounds.   No murmur heard. HR 50-60s  Pulmonary/Chest: Effort normal. She has no wheezes. She has no rales.  Abdominal: Soft. Bowel sounds are normal.    Musculoskeletal: She exhibits edema and tenderness.  Mainly pain in neck and left hip. Trace edema BLE  Lymphadenopathy:    She has no cervical adenopathy.  Neurological: She is alert and oriented to person, place, and time. She has normal reflexes. No cranial nerve deficit. She exhibits normal muscle tone. Coordination normal.  Skin: Skin is warm and dry. No lesion and no rash noted. No erythema.  New skin lesion at bridge of her nose, new, scaly, superficial, non healing--11/17/12 Dermatology consultation: Bowen's disease-R leg. 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 BLE chronic venous insufficiency with mild erythema lower 1/3 of the BLE.     Psychiatric: Cognition and memory are impaired. She exhibits abnormal recent memory.    Filed Vitals:   10/19/13 1109  BP: 172/86  Pulse: 60  Temp: 98.9 F (37.2 C)  TempSrc: Tympanic  Resp: 18      Labs reviewed: Basic Metabolic Panel:  Recent Labs  16/60/63  05/20/13  07/26/13 09/02/13 10/18/13  NA  --   < >  --   < > 141 140 138  K  --   < >  --   < > 4.2 4.2 3.9  BUN  --   < >  --   < > 24* 19 25*  CREATININE  --   < >  --   < > 1.0 0.9 1.0  TSH 5.47  --  2.74  --   --   --   --   < > = values in this interval not displayed. CBC:  Recent Labs  02/25/13 06/07/13 06/12/13  WBC 10.0 11.5 6.8  HGB 13.3 13.6 14.0  HCT 41 40 42  PLT 371 267 271    Past procedures:  06/07/13 CXR borderline cardiomegaly unchanged with new minimal pulmonary vascular congestion, patchy bibasilar atelectasis or pneumonitis appears new.   Assessment/Plan Problem List Items Addressed This Visit   Unspecified  hypothyroidism (Chronic)     takes Levothyroxine daily, TSH 2.741 05/20/13. Update TSH      Unspecified constipation     10/18/13 Senokot S bid. Observe.     Unspecified chronic bronchitis     Chronic cough. Recent developing CHF aggravated her chronic cough. It should be better since  Furosemide.      Pain in left hip     chronic since the left hip fx surgical repair 06/2010-pain is managed with Tramadol 50mg  qid,Tylenol 650mg  qid, Celebrex 200mg  daily. W/c for mobility when out of bed. X-ray 01/13/13 showed no new acute fracture, subluxation, or dislocation. Also showed old healed fracture deformity at the left femoral head, neck, and intertrochanteric region with orthopedic rod, plate and screws spanning form superolateral to the femoral head to the proximal shaft. Potion of the orthopedic screw appear outside the confines of the femoral head. s/p Orthopedic consultation.         Neuropathy     Stable on Gabapentin 100mg  tid and Tylenol 650mg  qid       Nausea with vomiting - Primary     Resolved.     HTN (hypertension)     Elevated Sbp, denied chest pain, headaches, vision changes, or dizziness. Continue Bystolic 10mg  and Losartan 50mg  bid. Added Furosemide should help blood pressure. Continue to observe.      GERD (gastroesophageal reflux disease)     Hx GI bleed, stable, Hgb11.9 07/14/11-11.6 09/29/39, off  Carafate 12/14/12 and  continue Omeprazole 40mg  bid.     Dementia     Gradual decline, takes Namenda and lives in [place].         CHF (congestive heart failure)     Improved O2 desaturation, cough, BLE edema. CXR 10/14/13 mild to moderate congestive heart failure. 10/18/13 BNP 502.8. Continue Furosemide 10mg  Kcl daily. Obtain Echocardiogram to evaluate further.      CAD (coronary artery disease) of artery bypass graft     Stable on Imdur 30mg . Last chest pain 06/30/13 relived with NTG and MaaLox.       Anxiety     takes Cymbalta 30mg  bid-stable.           Family/ Staff Communication: observe the patient.   Goals of Care: SNF  Labs/tests ordered: BMP and BNP done 10/18/13. Obtain TSH. Echocardiogram.

## 2013-10-19 NOTE — Assessment & Plan Note (Signed)
10/18/13 Senokot S bid. Observe.   

## 2013-10-19 NOTE — Assessment & Plan Note (Signed)
takes Levothyroxine daily, TSH 2.741 05/20/13. Update TSH

## 2013-10-19 NOTE — Assessment & Plan Note (Signed)
Stable on Gabapentin 100mg tid and Tylenol 650mg qid  

## 2013-10-19 NOTE — Assessment & Plan Note (Signed)
takes Cymbalta 30mg bid-stable.    

## 2013-10-19 NOTE — Assessment & Plan Note (Signed)
chronic since the left hip fx surgical repair 06/2010-pain is managed with Tramadol 50mg qid,Tylenol 650mg qid, Celebrex 200mg daily. W/c for mobility when out of bed. X-ray 01/13/13 showed no new acute fracture, subluxation, or dislocation. Also showed old healed fracture deformity at the left femoral head, neck, and intertrochanteric region with orthopedic rod, plate and screws spanning form superolateral to the femoral head to the proximal shaft. Potion of the orthopedic screw appear outside the confines of the femoral head. s/p Orthopedic consultation.    

## 2013-10-19 NOTE — Assessment & Plan Note (Signed)
Stable on Imdur 30mg. Last chest pain 06/30/13 relived with NTG and MaaLox.    

## 2013-10-19 NOTE — Assessment & Plan Note (Addendum)
Elevated Sbp, denied chest pain, headaches, vision changes, or dizziness. Continue Bystolic 10mg  and Losartan 50mg  bid. Added Furosemide should help blood pressure. Continue to observe.

## 2013-10-19 NOTE — Assessment & Plan Note (Signed)
Chronic cough. Recent developing CHF aggravated her chronic cough. It should be better since Furosemide.    

## 2013-10-19 NOTE — Assessment & Plan Note (Signed)
Gradual decline, takes Namenda and lives in SNF.  

## 2013-10-19 NOTE — Assessment & Plan Note (Signed)
Hx GI bleed, stable, Hgb11.9 07/14/11-11.6 09/29/39, off  Carafate 12/14/12 and  continue Omeprazole 40mg bid.   

## 2013-10-19 NOTE — Assessment & Plan Note (Signed)
Resolved

## 2013-10-21 LAB — TSH: TSH: 2.77 u[IU]/mL (ref 0.41–5.90)

## 2013-10-22 ENCOUNTER — Other Ambulatory Visit: Payer: Self-pay | Admitting: Nurse Practitioner

## 2013-10-22 DIAGNOSIS — E039 Hypothyroidism, unspecified: Secondary | ICD-10-CM

## 2013-10-22 DIAGNOSIS — I509 Heart failure, unspecified: Secondary | ICD-10-CM

## 2013-11-02 ENCOUNTER — Encounter: Payer: Self-pay | Admitting: Nurse Practitioner

## 2013-11-16 ENCOUNTER — Encounter: Payer: Self-pay | Admitting: Nurse Practitioner

## 2013-11-16 ENCOUNTER — Non-Acute Institutional Stay (SKILLED_NURSING_FACILITY): Payer: Medicare Other | Admitting: Nurse Practitioner

## 2013-11-16 DIAGNOSIS — G589 Mononeuropathy, unspecified: Secondary | ICD-10-CM

## 2013-11-16 DIAGNOSIS — E039 Hypothyroidism, unspecified: Secondary | ICD-10-CM

## 2013-11-16 DIAGNOSIS — I5032 Chronic diastolic (congestive) heart failure: Secondary | ICD-10-CM

## 2013-11-16 DIAGNOSIS — F419 Anxiety disorder, unspecified: Secondary | ICD-10-CM

## 2013-11-16 DIAGNOSIS — G629 Polyneuropathy, unspecified: Secondary | ICD-10-CM

## 2013-11-16 DIAGNOSIS — K5900 Constipation, unspecified: Secondary | ICD-10-CM

## 2013-11-16 DIAGNOSIS — I509 Heart failure, unspecified: Secondary | ICD-10-CM

## 2013-11-16 DIAGNOSIS — G8929 Other chronic pain: Secondary | ICD-10-CM

## 2013-11-16 DIAGNOSIS — F0390 Unspecified dementia without behavioral disturbance: Secondary | ICD-10-CM

## 2013-11-16 DIAGNOSIS — R21 Rash and other nonspecific skin eruption: Secondary | ICD-10-CM | POA: Insufficient documentation

## 2013-11-16 DIAGNOSIS — J440 Chronic obstructive pulmonary disease with acute lower respiratory infection: Secondary | ICD-10-CM

## 2013-11-16 DIAGNOSIS — F411 Generalized anxiety disorder: Secondary | ICD-10-CM

## 2013-11-16 DIAGNOSIS — I10 Essential (primary) hypertension: Secondary | ICD-10-CM

## 2013-11-16 DIAGNOSIS — K219 Gastro-esophageal reflux disease without esophagitis: Secondary | ICD-10-CM

## 2013-11-16 NOTE — Assessment & Plan Note (Signed)
takes Cymbalta 30mg bid-stable.    

## 2013-11-16 NOTE — Assessment & Plan Note (Signed)
Stable on Gabapentin 100mg tid and Tylenol 650mg qid  

## 2013-11-16 NOTE — Assessment & Plan Note (Signed)
10/18/13 Senokot S bid. Observe.

## 2013-11-16 NOTE — Assessment & Plan Note (Signed)
takes Levothyroxine 25mcg daily, TSH 2.741 05/20/13. 2.772 10/21/13

## 2013-11-16 NOTE — Assessment & Plan Note (Signed)
Improved O2 desaturation, cough, BLE edema. CXR 10/14/13 mild to moderate congestive heart failure. 10/18/13 BNP 502.8. Continue Furosemide 10mg  Kcl 10meq daily. 10/19/13 Echocardiogram showed normal LV systolic function. Weight down #8 Ibs since Furosemide 10/18/13. Continue to monitor her weight.

## 2013-11-16 NOTE — Assessment & Plan Note (Addendum)
Under the right breast: will apply Nystatin powder bid to affected area until healed.

## 2013-11-16 NOTE — Assessment & Plan Note (Signed)
Gradual decline, takes Namenda and [place]

## 2013-11-16 NOTE — Assessment & Plan Note (Signed)
chronic since the left hip fx surgical repair 06/2010-pain is managed with Tramadol 50mg  qid,Tylenol 650mg  qid, Celebrex 200mg  daily. W/c for mobility when out of bed. X-ray 01/13/13 showed no new acute fracture, subluxation, or dislocation. Also showed old healed fracture deformity at the left femoral head, neck, and intertrochanteric region with orthopedic rod, plate and screws spanning form superolateral to the femoral head to the proximal shaft. Potion of the orthopedic screw appear outside the confines of the femoral head. s/p Orthopedic consultation.

## 2013-11-16 NOTE — Progress Notes (Signed)
Patient ID: Randolm IdolIris O [name], female   DOB: 09-09-1917, 80 y.o.   MRN: [id number]   Code Status: DNR  No Known Allergies  Chief Complaint  Patient presents with  . Medical Management of Chronic Issues  . Acute Visit    rash, weight    HPI: Patient is a 80 y.o. female [facility] today for evaluation of CHF, under L breast rash, and chronic medical conditions.  Problem List Items Addressed This Visit   Chronic pain (Chronic)     chronic since the left hip fx surgical repair 06/2010-pain is managed with Tramadol 50mg  qid,Tylenol 650mg  qid, Celebrex 200mg  daily. W/c for mobility when out of bed. X-ray 01/13/13 showed no new acute fracture, subluxation, or dislocation. Also showed old healed fracture deformity at the left femoral head, neck, and intertrochanteric region with orthopedic rod, plate and screws spanning form superolateral to the femoral head to the proximal shaft. Potion of the orthopedic screw appear outside the confines of the femoral head. s/p Orthopedic consultation.       Unspecified hypothyroidism (Chronic)     takes Levothyroxine 25mcg daily, TSH 2.741 05/20/13. 2.772 10/21/13    GERD (gastroesophageal reflux disease)     Hx GI bleed, stable, Hgb11.9 07/14/11-11.6 09/29/39, off  Carafate 12/14/12 and  continue Omeprazole 40mg  bid.      Dementia     Gradual decline, takes Namenda and [place] in [place]     Neuropathy     Stable on Gabapentin 100mg  tid and Tylenol 650mg  qid       Anxiety     takes Cymbalta 30mg  bid-stable.      HTN (hypertension)     Elevated Sbp, continue Bystolic 10mg  and Losartan 50mg  bid.     Bronchitis, chronic obstructive w acute bronchitis     Chronic cough. Recent developing CHF aggravated her chronic cough. It should be better since Furosemide.       Chronic diastolic CHF (congestive heart failure) - Primary     Improved O2 desaturation, cough, BLE edema. CXR 10/14/13 mild to moderate congestive heart failure. 10/18/13 BNP  502.8. Continue Furosemide 10mg  Kcl 10meq daily. 10/19/13 Echocardiogram showed normal LV systolic function. Weight down #8 Ibs since Furosemide 10/18/13. Continue to monitor her weight.       Unspecified constipation     10/18/13 Senokot S bid. Observe.      Rash and nonspecific skin eruption     Under the right breast: will apply Nystatin powder bid to affected area until healed.        Review of Systems:  Review of Systems  Constitutional: Positive for malaise/fatigue. Negative for fever, chills, weight loss and diaphoresis.  HENT: Positive for hearing loss. Negative for congestion, ear pain and sore throat.   Eyes: Negative for pain, discharge and redness.  Respiratory: Positive for cough. Negative for sputum production, shortness of breath and wheezing.        Chronic cough.   Cardiovascular: Positive for leg swelling (trace in ankle, chronic, venous insufficiency). Negative for chest pain, orthopnea, claudication and PND.       Reported chest pain x1 06/30/13. Trace edema BLE  Gastrointestinal: Negative for heartburn, nausea, vomiting, abdominal pain, diarrhea, constipation and blood in stool.          Genitourinary: Positive for frequency. Negative for dysuria, urgency and flank pain.  Musculoskeletal: Positive for back pain, joint pain (left hip pain. ), myalgias and neck pain. Negative for falls.  Skin:  Negative for itching and rash.       BLE chronic venous insufficiency with mild erythema lower 1/3 of the BLE. Rash under the R breast  Neurological: Negative for dizziness, tingling, tremors, speech change, focal weakness, seizures, loss of consciousness and weakness. Sensory change: periphearl neuropathy.  Endo/Heme/Allergies: Negative for environmental allergies and polydipsia. Does not bruise/bleed easily.  Psychiatric/Behavioral: Positive for memory loss. Negative for depression and hallucinations. The patient is not nervous/anxious and does not have insomnia.      Past  Medical History  Diagnosis Date  . Hypertension   . COPD (chronic obstructive pulmonary disease)   . CHF (congestive heart failure)   . Coronary artery disease   . Renal disorder   . GERD (gastroesophageal reflux disease)   . Osteoporosis   . Syncope   . Dementia   . Anemia   . Parkinson disease   . Neuropathy   . Thyroid disease   . Hyperlipidemia   . Anxiety   . Depression   . Chronic pain 11/01/2011   Past Surgical History  Procedure Laterality Date  . Fracture surgery    . Esophagogastroduodenoscopy  05/05/2012    Procedure: ESOPHAGOGASTRODUODENOSCOPY (EGD);  Surgeon: [name], MD;  Location: [facility] ENDOSCOPY;  Service: Endoscopy;  Laterality: N/A;  Pediatric upper endoscope   Social History:   reports that she has never smoked. She does not have any smokeless tobacco history on file. She reports that she does not drink alcohol or use illicit drugs.  Medications: Patient's Medications  New Prescriptions   No medications on file  Previous Medications   ACETAMINOPHEN (TYLENOL) 325 MG TABLET    Take 650 mg by mouth 3 (three) times daily.   ACYCLOVIR (ZOVIRAX) 200 MG CAPSULE    Take 200 mg by mouth daily.   AMOXICILLIN (AMOXIL) 500 MG CAPSULE    Take 4 capsules by mouth prior to dental procedure as directed by the access dental care staff   AMOXICILLIN-CLAVULANATE (AUGMENTIN) 875-125 MG PER TABLET    Take 1 tablet by mouth 2 (two) times daily. For 7 days starting 09/07/13   CALCIUM-VITAMIN D (OSCAL WITH D) 500-200 MG-UNIT PER TABLET    Take 1 tablet by mouth daily.   CELECOXIB (CELEBREX) 200 MG CAPSULE    Take 200 mg by mouth daily.   DULOXETINE (CYMBALTA) 30 MG CAPSULE    Take 30 mg by mouth 2 (two) times daily.   FLUTICASONE-SALMETEROL (ADVAIR) 100-50 MCG/DOSE AEPB    Inhale 1 puff into the lungs every 12 (twelve) hours.   GABAPENTIN (NEURONTIN) 100 MG CAPSULE    Take 100 mg by mouth 3 (three) times daily.   GUAIFENESIN (MUCINEX) 600 MG 12 HR TABLET    Take by mouth 2  (two) times daily. For 10 days starting 09/07/13   HYDROXYPROPYL METHYLCELLULOSE (ISOPTO TEARS) 2.5 % OPHTHALMIC SOLUTION    Place 1 drop into both eyes every 4 (four) hours.   ISOSORBIDE MONONITRATE (IMDUR) 30 MG 24 HR TABLET    Take 30 mg by mouth daily.   LEVOTHYROXINE (SYNTHROID, LEVOTHROID) 25 MCG TABLET    Take 25 mcg by mouth daily.    LOSARTAN (COZAAR) 50 MG TABLET    Take 50 mg by mouth 2 (two) times daily.   MEMANTINE (NAMENDA) 5 MG TABLET    Take 14 mg by mouth daily.    NEBIVOLOL (BYSTOLIC) 10 MG TABLET    Take 1 tablet (10 mg total) by mouth daily.   OMEPRAZOLE (PRILOSEC) 20  MG CAPSULE    Take 2 capsules (40 mg total) by mouth 2 (two) times daily.   SACCHAROMYCES BOULARDII (FLORASTOR) 250 MG CAPSULE    Take 250 mg by mouth 2 (two) times daily. For 7 days starting 09/07/13   SENNA-DOCUSATE (SENOKOT S) 8.6-50 MG PER TABLET    Take 1 tablet by mouth 2 (two) times daily.   TIOTROPIUM (SPIRIVA) 18 MCG INHALATION CAPSULE    Place 18 mcg into inhaler and inhale daily.   TRAMADOL (ULTRAM) 50 MG TABLET    1 by mouth three times daily (6 am, 12pm, 6 pm, 12 midnight)  Modified Medications   No medications on file  Discontinued Medications   No medications on file     Physical Exam: Physical Exam  Constitutional: She is oriented to person, place, and time. She appears well-developed and well-nourished.  HENT:  Head: Normocephalic and atraumatic.  Eyes: Conjunctivae and EOM are normal. Pupils are equal, round, and reactive to light.  Neck: Normal range of motion. No JVD present. No thyromegaly present.  Chronic decreased lateral ROM of the neck   Cardiovascular: Regular rhythm and normal heart sounds.   No murmur heard. HR 50-60s  Pulmonary/Chest: Effort normal. She has no wheezes. She has no rales.  Abdominal: Soft. Bowel sounds are normal.  Musculoskeletal: She exhibits edema and tenderness.  Mainly pain in neck and left hip. Trace edema BLE  Lymphadenopathy:    She has no cervical  adenopathy.  Neurological: She is alert and oriented to person, place, and time. She has normal reflexes. No cranial nerve deficit. She exhibits normal muscle tone. Coordination normal.  Skin: Skin is warm and dry. No lesion and no rash noted. No erythema.  New skin lesion at bridge of her nose, new, scaly, superficial, non healing--11/17/12 Dermatology consultation: Bowen's disease-R leg. 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 BLE chronic venous insufficiency with mild erythema lower 1/3 of the BLE.  Rash under the R breast.     Psychiatric: Cognition and memory are impaired. She exhibits abnormal recent memory.    Filed Vitals:   11/16/13 1358  BP: 123/59  Pulse: 53  Temp: 97.7 F (36.5 C)  TempSrc: Tympanic  Resp: 18      Labs reviewed: Basic Metabolic Panel:  Recent Labs  16/02/9609/16/14  05/20/13  07/26/13 09/02/13 10/18/13 10/21/13  NA  --   < >  --   < > 141 140 138  --   K  --   < >  --   < > 4.2 4.2 3.9  --   BUN  --   < >  --   < > 24* 19 25*  --   CREATININE  --   < >  --   < > 1.0 0.9 1.0  --   TSH 5.47  --  2.74  --   --   --   --  2.77  < > = values in this interval not displayed. CBC:  Recent Labs  02/25/13 06/07/13 06/12/13  WBC 10.0 11.5 6.8  HGB 13.3 13.6 14.0  HCT 41 40 42  PLT 371 267 271    Past procedures:  06/07/13 CXR borderline cardiomegaly unchanged with new minimal pulmonary vascular congestion, patchy bibasilar atelectasis or pneumonitis appears new.   Assessment/Plan Problem List Items Addressed This Visit   Unspecified hypothyroidism (Chronic)     takes Levothyroxine 25mcg daily, TSH 2.741 05/20/13. Update TSH  Unspecified constipation     10/18/13 Senokot S bid. Observe.     Unspecified chronic bronchitis     Chronic cough. Recent developing CHF aggravated her chronic cough. It should be better since Furosemide.      Pain in left hip     chronic since the left hip fx surgical repair  06/2010-pain is managed with Tramadol 50mg  qid,Tylenol 650mg  qid, Celebrex 200mg  daily. W/c for mobility when out of bed. X-ray 01/13/13 showed no new acute fracture, subluxation, or dislocation. Also showed old healed fracture deformity at the left femoral head, neck, and intertrochanteric region with orthopedic rod, plate and screws spanning form superolateral to the femoral head to the proximal shaft. Potion of the orthopedic screw appear outside the confines of the femoral head. s/p Orthopedic consultation.         Neuropathy     Stable on Gabapentin 100mg  tid and Tylenol 650mg  qid       Nausea with vomiting - Primary     Resolved.     HTN (hypertension)     Elevated Sbp, denied chest pain, headaches, vision changes, or dizziness. Continue Bystolic 10mg  and Losartan 50mg  bid. Added Furosemide should help blood pressure. Continue to observe.      GERD (gastroesophageal reflux disease)     Hx GI bleed, stable, Hgb11.9 07/14/11-11.6 09/29/39, off  Carafate 12/14/12 and  continue Omeprazole 40mg  bid.     Dementia     Gradual decline, takes Namenda and lives in [place].         CHF (congestive heart failure)     Improved O2 desaturation, cough, BLE edema. CXR 10/14/13 mild to moderate congestive heart failure. 10/18/13 BNP 502.8. Continue Furosemide 10mg  Kcl daily. Obtain Echocardiogram to evaluate further.      CAD (coronary artery disease) of artery bypass graft     Stable on Imdur 30mg . Last chest pain 06/30/13 relived with NTG and MaaLox.       Anxiety     takes Cymbalta 30mg  bid-stable.           Family/ Staff Communication: observe the patient.   Goals of Care: SNF  Labs/tests ordered: none

## 2013-11-16 NOTE — Assessment & Plan Note (Signed)
Chronic cough. Recent developing CHF aggravated her chronic cough. It should be better since Furosemide.

## 2013-11-16 NOTE — Assessment & Plan Note (Signed)
Elevated Sbp, continue Bystolic 10mg and Losartan 50mg bid.   

## 2013-11-16 NOTE — Assessment & Plan Note (Signed)
Stable on Imdur 30mg . Last chest pain 06/30/13 relived with NTG and MaaLox.

## 2013-11-16 NOTE — Assessment & Plan Note (Signed)
Hx GI bleed, stable, Hgb11.9 07/14/11-11.6 09/29/39, off  Carafate 12/14/12 and  continue Omeprazole 40mg  bid.

## 2013-11-23 ENCOUNTER — Non-Acute Institutional Stay (SKILLED_NURSING_FACILITY): Payer: Medicare Other | Admitting: Nurse Practitioner

## 2013-11-23 ENCOUNTER — Encounter: Payer: Self-pay | Admitting: Nurse Practitioner

## 2013-11-23 DIAGNOSIS — M25552 Pain in left hip: Secondary | ICD-10-CM

## 2013-11-23 DIAGNOSIS — I10 Essential (primary) hypertension: Secondary | ICD-10-CM

## 2013-11-23 DIAGNOSIS — J440 Chronic obstructive pulmonary disease with acute lower respiratory infection: Secondary | ICD-10-CM

## 2013-11-23 DIAGNOSIS — G629 Polyneuropathy, unspecified: Secondary | ICD-10-CM

## 2013-11-23 DIAGNOSIS — J42 Unspecified chronic bronchitis: Secondary | ICD-10-CM

## 2013-11-23 DIAGNOSIS — R609 Edema, unspecified: Secondary | ICD-10-CM

## 2013-11-23 DIAGNOSIS — F419 Anxiety disorder, unspecified: Secondary | ICD-10-CM

## 2013-11-23 DIAGNOSIS — G589 Mononeuropathy, unspecified: Secondary | ICD-10-CM

## 2013-11-23 DIAGNOSIS — F411 Generalized anxiety disorder: Secondary | ICD-10-CM

## 2013-11-23 DIAGNOSIS — F0390 Unspecified dementia without behavioral disturbance: Secondary | ICD-10-CM

## 2013-11-23 DIAGNOSIS — K219 Gastro-esophageal reflux disease without esophagitis: Secondary | ICD-10-CM

## 2013-11-23 DIAGNOSIS — M25559 Pain in unspecified hip: Secondary | ICD-10-CM

## 2013-11-23 DIAGNOSIS — E039 Hypothyroidism, unspecified: Secondary | ICD-10-CM

## 2013-11-23 DIAGNOSIS — I509 Heart failure, unspecified: Secondary | ICD-10-CM

## 2013-11-23 DIAGNOSIS — K5900 Constipation, unspecified: Secondary | ICD-10-CM

## 2013-11-23 DIAGNOSIS — I5032 Chronic diastolic (congestive) heart failure: Secondary | ICD-10-CM

## 2013-11-23 NOTE — Assessment & Plan Note (Signed)
BLE-worse, increase Furosemide to 20mg  daily, continue Kcl 10meq daily, update BMP and BNP in one week.

## 2013-11-23 NOTE — Assessment & Plan Note (Signed)
Stable on Imdur 30mg. Last chest pain 06/30/13 relived with NTG and MaaLox.    

## 2013-11-23 NOTE — Assessment & Plan Note (Signed)
Hx GI bleed, stable, Hgb11.9 07/14/11-11.6 09/29/39, off  Carafate 12/14/12 and  continue Omeprazole 40mg bid.   

## 2013-11-23 NOTE — Assessment & Plan Note (Signed)
takes Cymbalta 30mg bid-stable.    

## 2013-11-23 NOTE — Assessment & Plan Note (Signed)
chronic since the left hip fx surgical repair 06/2010-pain is managed with Tramadol 50mg qid,Tylenol 650mg qid, Celebrex 200mg daily. W/c for mobility when out of bed. X-ray 01/13/13 showed no new acute fracture, subluxation, or dislocation. Also showed old healed fracture deformity at the left femoral head, neck, and intertrochanteric region with orthopedic rod, plate and screws spanning form superolateral to the femoral head to the proximal shaft. Potion of the orthopedic screw appear outside the confines of the femoral head. s/p Orthopedic consultation.    

## 2013-11-23 NOTE — Assessment & Plan Note (Signed)
Elevated Sbp, continue Bystolic 10mg and Losartan 50mg bid.   

## 2013-11-23 NOTE — Assessment & Plan Note (Signed)
10/18/13 Senokot S bid. Observe.   

## 2013-11-23 NOTE — Assessment & Plan Note (Signed)
Chronic cough better since Furosemide.

## 2013-11-23 NOTE — Assessment & Plan Note (Signed)
Stable on Gabapentin 100mg tid and Tylenol 650mg qid  

## 2013-11-23 NOTE — Assessment & Plan Note (Signed)
Gradual decline, takes Namenda and lives in SNF.  

## 2013-11-23 NOTE — Assessment & Plan Note (Signed)
Chronic cough  better since Furosemide. Continue Spiriva and Advair.

## 2013-11-23 NOTE — Assessment & Plan Note (Signed)
takes Levothyroxine 25mcg daily, TSH 2.741 05/20/13. 2.772 10/21/13 

## 2013-11-23 NOTE — Assessment & Plan Note (Signed)
Compensated clinically except persisted/slightly worse BLE edema and basilar rales.

## 2013-11-23 NOTE — Progress Notes (Signed)
Patient ID: Randolm Idol, female   DOB: 1917-06-08, 80 y.o.   MRN: [id number]   Code Status: DNR  No Known Allergies  Chief Complaint  Patient presents with  . Medical Management of Chronic Issues  . Acute Visit    worsened edema BLE    HPI: Patient is a 80 y.o. female seen in the SNF at [facility] today for evaluation of worsened BLEedema and chronic medical conditions.  Problem List Items Addressed This Visit   Unspecified hypothyroidism (Chronic)     takes Levothyroxine daily, TSH 2.741 05/20/13. 2.772 10/21/13     GERD (gastroesophageal reflux disease)     Hx GI bleed, stable, Hgb11.9 07/14/11-11.6 09/29/39, off  Carafate 12/14/12 and  continue Omeprazole 40mg  bid.       Dementia     Gradual decline, takes Namenda and lives in [place].      Neuropathy     Stable on Gabapentin 100mg  tid and Tylenol 650mg  qid      Anxiety     takes Cymbalta 30mg  bid-stable.       Unspecified chronic bronchitis     Chronic cough better since Furosemide.        HTN (hypertension)     Elevated Sbp, continue Bystolic 10mg  and Losartan 50mg  bid.      Pain in left hip     chronic since the left hip fx surgical repair 06/2010-pain is managed with Tramadol 50mg  qid,Tylenol 650mg  qid, Celebrex 200mg  daily. W/c for mobility when out of bed. X-ray 01/13/13 showed no new acute fracture, subluxation, or dislocation. Also showed old healed fracture deformity at the left femoral head, neck, and intertrochanteric region with orthopedic rod, plate and screws spanning form superolateral to the femoral head to the proximal shaft. Potion of the orthopedic screw appear outside the confines of the femoral head. s/p Orthopedic consultation.        Bronchitis, chronic obstructive w acute bronchitis     Chronic cough  better since Furosemide. Continue Spiriva and Advair.       Chronic diastolic CHF (congestive heart failure)     Compensated clinically except persisted/slightly worse BLE edema and  basilar rales.     Unspecified constipation     10/18/13 Senokot S bid. Observe.       Edema - Primary     BLE-worse, increase Furosemide to 20mg  daily, continue Kcl daily, update BMP and BNP in one week.        Review of Systems:  Review of Systems  Constitutional: Negative for fever, chills, weight loss, malaise/fatigue and diaphoresis.  HENT: Positive for hearing loss. Negative for congestion, ear pain and sore throat.   Eyes: Negative for pain, discharge and redness.  Respiratory: Positive for cough. Negative for sputum production, shortness of breath and wheezing.        Chronic cough.   Cardiovascular: Positive for leg swelling (trace in ankle, chronic, venous insufficiency). Negative for chest pain, orthopnea, claudication and PND.       Reported chest pain x1 06/30/13. 1+edema BLE  Gastrointestinal: Negative for heartburn, nausea, vomiting, abdominal pain, diarrhea, constipation and blood in stool.          Genitourinary: Positive for frequency. Negative for dysuria, urgency and flank pain.  Musculoskeletal: Positive for back pain, joint pain (left hip pain. ), myalgias and neck pain. Negative for falls.  Skin: Negative for itching and rash.       BLE chronic venous insufficiency with mild erythema lower  1/3 of the BLE. Lipo dermatosclerosis. Rash under the R breast-resolved.   Neurological: Negative for dizziness, tingling, tremors, speech change, focal weakness, seizures, loss of consciousness and weakness. Sensory change: periphearl neuropathy.  Endo/Heme/Allergies: Negative for environmental allergies and polydipsia. Does not bruise/bleed easily.  Psychiatric/Behavioral: Positive for memory loss. Negative for depression and hallucinations. The patient is not nervous/anxious and does not have insomnia.      Past Medical History  Diagnosis Date  . Hypertension   . COPD (chronic obstructive pulmonary disease)   . CHF (congestive heart failure)   . Coronary artery  disease   . Renal disorder   . GERD (gastroesophageal reflux disease)   . Osteoporosis   . Syncope   . Dementia   . Anemia   . Parkinson disease   . Neuropathy   . Thyroid disease   . Hyperlipidemia   . Anxiety   . Depression   . Chronic pain 11/01/2011   Past Surgical History  Procedure Laterality Date  . Fracture surgery    . Esophagogastroduodenoscopy  05/05/2012    Procedure: ESOPHAGOGASTRODUODENOSCOPY (EGD);  Surgeon: [name], MD;  Location: [facility] ENDOSCOPY;  Service: Endoscopy;  Laterality: N/A;  Pediatric upper endoscope   Social History:   reports that she has never smoked. She does not have any smokeless tobacco history on file. She reports that she does not drink alcohol or use illicit drugs.  Medications: Patient's Medications  New Prescriptions   No medications on file  Previous Medications   ACETAMINOPHEN (TYLENOL) 325 MG TABLET    Take 650 mg by mouth 3 (three) times daily.   ACYCLOVIR (ZOVIRAX) 200 MG CAPSULE    Take 200 mg by mouth daily.   AMOXICILLIN (AMOXIL) 500 MG CAPSULE    Take 4 capsules by mouth prior to dental procedure as directed by the access dental care staff   AMOXICILLIN-CLAVULANATE (AUGMENTIN) 875-125 MG PER TABLET    Take 1 tablet by mouth 2 (two) times daily. For 7 days starting 09/07/13   CALCIUM-VITAMIN D (OSCAL WITH D) 500-200 MG-UNIT PER TABLET    Take 1 tablet by mouth daily.   CELECOXIB (CELEBREX) 200 MG CAPSULE    Take 200 mg by mouth daily.   DULOXETINE (CYMBALTA) 30 MG CAPSULE    Take 30 mg by mouth 2 (two) times daily.   FLUTICASONE-SALMETEROL (ADVAIR) 100-50 MCG/DOSE AEPB    Inhale 1 puff into the lungs every 12 (twelve) hours.   GABAPENTIN (NEURONTIN) 100 MG CAPSULE    Take 100 mg by mouth 3 (three) times daily.   GUAIFENESIN (MUCINEX) 600 MG 12 HR TABLET    Take by mouth 2 (two) times daily. For 10 days starting 09/07/13   HYDROXYPROPYL METHYLCELLULOSE (ISOPTO TEARS) 2.5 % OPHTHALMIC SOLUTION    Place 1 drop into both eyes every  4 (four) hours.   ISOSORBIDE MONONITRATE (IMDUR) 30 MG 24 HR TABLET    Take 30 mg by mouth daily.   LEVOTHYROXINE (SYNTHROID, LEVOTHROID) 25 MCG TABLET    Take 25 mcg by mouth daily.    LOSARTAN (COZAAR) 50 MG TABLET    Take 50 mg by mouth 2 (two) times daily.   MEMANTINE (NAMENDA) 5 MG TABLET    Take 14 mg by mouth daily.    NEBIVOLOL (BYSTOLIC) 10 MG TABLET    Take 1 tablet (10 mg total) by mouth daily.   OMEPRAZOLE (PRILOSEC) 20 MG CAPSULE    Take 2 capsules (40 mg total) by mouth 2 (two) times  daily.   SACCHAROMYCES BOULARDII (FLORASTOR) 250 MG CAPSULE    Take 250 mg by mouth 2 (two) times daily. For 7 days starting 09/07/13   SENNA-DOCUSATE (SENOKOT S) 8.6-50 MG PER TABLET    Take 1 tablet by mouth 2 (two) times daily.   TIOTROPIUM (SPIRIVA) 18 MCG INHALATION CAPSULE    Place 18 mcg into inhaler and inhale daily.   TRAMADOL (ULTRAM) 50 MG TABLET    1 by mouth three times daily (6 am, 12pm, 6 pm, 12 midnight)  Modified Medications   No medications on file  Discontinued Medications   No medications on file     Physical Exam: Physical Exam  Constitutional: She is oriented to person, place, and time. She appears well-developed and well-nourished.  HENT:  Head: Normocephalic and atraumatic.  Eyes: Conjunctivae and EOM are normal. Pupils are equal, round, and reactive to light.  Neck: Normal range of motion. No JVD present. No thyromegaly present.  Chronic decreased lateral ROM of the neck   Cardiovascular: Regular rhythm and normal heart sounds.   No murmur heard. HR 50-60s  Pulmonary/Chest: Effort normal. She has no wheezes. She has no rales.  Abdominal: Soft. Bowel sounds are normal.  Musculoskeletal: She exhibits edema and tenderness.  Mainly pain in neck and left hip. 1+ edema BLE. Lipo dermatosclerosis BLE from knee down.   Lymphadenopathy:    She has no cervical adenopathy.  Neurological: She is alert and oriented to person, place, and time. She has normal reflexes. No cranial  nerve deficit. She exhibits normal muscle tone. Coordination normal.  Skin: Skin is warm and dry. No lesion and no rash noted. No erythema.  New skin lesion at bridge of her nose, new, scaly, superficial, non healing--11/17/12 Dermatology consultation: Bowen's disease-R leg. 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 BLE chronic venous insufficiency with mild erythema lower 1/3 of the BLE.  Rash under the R breast.     Psychiatric: Cognition and memory are impaired. She exhibits abnormal recent memory.    Filed Vitals:   11/23/13 1302  BP: 130/80  Pulse: 76  Temp: 97.5 F (36.4 C)  TempSrc: Tympanic  Resp: 16      Labs reviewed: Basic Metabolic Panel:  Recent Labs  16/02/9609/16/14  05/20/13  07/26/13 09/02/13 10/18/13 10/21/13  NA  --   < >  --   < > 141 140 138  --   K  --   < >  --   < > 4.2 4.2 3.9  --   BUN  --   < >  --   < > 24* 19 25*  --   CREATININE  --   < >  --   < > 1.0 0.9 1.0  --   TSH 5.47  --  2.74  --   --   --   --  2.77  < > = values in this interval not displayed. CBC:  Recent Labs  02/25/13 06/07/13 06/12/13  WBC 10.0 11.5 6.8  HGB 13.3 13.6 14.0  HCT 41 40 42  PLT 371 267 271    Past procedures:  06/07/13 CXR borderline cardiomegaly unchanged with new minimal pulmonary vascular congestion, patchy bibasilar atelectasis or pneumonitis appears new.   Assessment/Plan Problem List Items Addressed This Visit   Unspecified hypothyroidism (Chronic)     takes Levothyroxine 25mcg daily, TSH 2.741 05/20/13. Update TSH      Unspecified constipation     10/18/13 Senokot  S bid. Observe.     Unspecified chronic bronchitis     Chronic cough. Recent developing CHF aggravated her chronic cough. It should be better since Furosemide.      Pain in left hip     chronic since the left hip fx surgical repair 06/2010-pain is managed with Tramadol 50mg  qid,Tylenol 650mg  qid, Celebrex 200mg  daily. W/c for mobility when out of bed. X-ray  01/13/13 showed no new acute fracture, subluxation, or dislocation. Also showed old healed fracture deformity at the left femoral head, neck, and intertrochanteric region with orthopedic rod, plate and screws spanning form superolateral to the femoral head to the proximal shaft. Potion of the orthopedic screw appear outside the confines of the femoral head. s/p Orthopedic consultation.         Neuropathy     Stable on Gabapentin 100mg  tid and Tylenol 650mg  qid       Nausea with vomiting - Primary     Resolved.     HTN (hypertension)     Elevated Sbp, denied chest pain, headaches, vision changes, or dizziness. Continue Bystolic 10mg  and Losartan 50mg  bid. Added Furosemide should help blood pressure. Continue to observe.      GERD (gastroesophageal reflux disease)     Hx GI bleed, stable, Hgb11.9 07/14/11-11.6 09/29/39, off  Carafate 12/14/12 and  continue Omeprazole 40mg  bid.     Dementia     Gradual decline, takes Namenda and lives in [place].         CHF (congestive heart failure)     Improved O2 desaturation, cough, BLE edema. CXR 10/14/13 mild to moderate congestive heart failure. 10/18/13 BNP 502.8. Continue Furosemide 10mg  Kcl daily. Obtain Echocardiogram to evaluate further.      CAD (coronary artery disease) of artery bypass graft     Stable on Imdur 30mg . Last chest pain 06/30/13 relived with NTG and MaaLox.       Anxiety     takes Cymbalta 30mg  bid-stable.           Family/ Staff Communication: observe the patient.   Goals of Care: SNF  Labs/tests ordered: BMP and BNP in one week.

## 2013-11-29 LAB — BASIC METABOLIC PANEL
BUN: 14 mg/dL (ref 4–21)
Creatinine: 0.9 mg/dL (ref 0.5–1.1)
Glucose: 86 mg/dL
Potassium: 4.3 mmol/L (ref 3.4–5.3)
Sodium: 140 mmol/L (ref 137–147)

## 2013-11-30 ENCOUNTER — Other Ambulatory Visit: Payer: Self-pay | Admitting: Nurse Practitioner

## 2013-11-30 DIAGNOSIS — I5032 Chronic diastolic (congestive) heart failure: Secondary | ICD-10-CM

## 2013-12-04 ENCOUNTER — Emergency Department (HOSPITAL_COMMUNITY): Payer: Medicare Other

## 2013-12-04 ENCOUNTER — Observation Stay (HOSPITAL_COMMUNITY)
Admission: EM | Admit: 2013-12-04 | Discharge: 2013-12-05 | Disposition: A | Discharge status: Skilled Nursing Facility | Payer: Medicare Other | Attending: Internal Medicine | Admitting: Internal Medicine

## 2013-12-04 ENCOUNTER — Encounter (HOSPITAL_COMMUNITY): Payer: Self-pay | Admitting: Internal Medicine

## 2013-12-04 DIAGNOSIS — J449 Chronic obstructive pulmonary disease, unspecified: Secondary | ICD-10-CM | POA: Insufficient documentation

## 2013-12-04 DIAGNOSIS — Z79899 Other long term (current) drug therapy: Secondary | ICD-10-CM | POA: Insufficient documentation

## 2013-12-04 DIAGNOSIS — F3289 Other specified depressive episodes: Secondary | ICD-10-CM | POA: Diagnosis not present

## 2013-12-04 DIAGNOSIS — F32A Depression, unspecified: Secondary | ICD-10-CM

## 2013-12-04 DIAGNOSIS — F329 Major depressive disorder, single episode, unspecified: Secondary | ICD-10-CM | POA: Insufficient documentation

## 2013-12-04 DIAGNOSIS — Z66 Do not resuscitate: Secondary | ICD-10-CM | POA: Diagnosis present

## 2013-12-04 DIAGNOSIS — E785 Hyperlipidemia, unspecified: Secondary | ICD-10-CM | POA: Diagnosis present

## 2013-12-04 DIAGNOSIS — F0280 Dementia in other diseases classified elsewhere without behavioral disturbance: Secondary | ICD-10-CM | POA: Insufficient documentation

## 2013-12-04 DIAGNOSIS — R001 Bradycardia, unspecified: Secondary | ICD-10-CM

## 2013-12-04 DIAGNOSIS — L309 Dermatitis, unspecified: Secondary | ICD-10-CM

## 2013-12-04 DIAGNOSIS — G20A1 Parkinson's disease without dyskinesia, without mention of fluctuations: Secondary | ICD-10-CM | POA: Diagnosis present

## 2013-12-04 DIAGNOSIS — K219 Gastro-esophageal reflux disease without esophagitis: Secondary | ICD-10-CM | POA: Diagnosis not present

## 2013-12-04 DIAGNOSIS — M25559 Pain in unspecified hip: Principal | ICD-10-CM | POA: Insufficient documentation

## 2013-12-04 DIAGNOSIS — M810 Age-related osteoporosis without current pathological fracture: Secondary | ICD-10-CM | POA: Diagnosis not present

## 2013-12-04 DIAGNOSIS — G3183 Dementia with Lewy bodies: Secondary | ICD-10-CM

## 2013-12-04 DIAGNOSIS — I2510 Atherosclerotic heart disease of native coronary artery without angina pectoris: Secondary | ICD-10-CM | POA: Insufficient documentation

## 2013-12-04 DIAGNOSIS — I5032 Chronic diastolic (congestive) heart failure: Secondary | ICD-10-CM | POA: Diagnosis present

## 2013-12-04 DIAGNOSIS — I10 Essential (primary) hypertension: Secondary | ICD-10-CM | POA: Diagnosis not present

## 2013-12-04 DIAGNOSIS — Y9210 Unspecified residential institution as the place of occurrence of the external cause: Secondary | ICD-10-CM | POA: Insufficient documentation

## 2013-12-04 DIAGNOSIS — E039 Hypothyroidism, unspecified: Secondary | ICD-10-CM | POA: Diagnosis present

## 2013-12-04 DIAGNOSIS — G629 Polyneuropathy, unspecified: Secondary | ICD-10-CM

## 2013-12-04 DIAGNOSIS — J189 Pneumonia, unspecified organism: Secondary | ICD-10-CM

## 2013-12-04 DIAGNOSIS — A6009 Herpesviral infection of other urogenital tract: Secondary | ICD-10-CM

## 2013-12-04 DIAGNOSIS — F411 Generalized anxiety disorder: Secondary | ICD-10-CM | POA: Diagnosis not present

## 2013-12-04 DIAGNOSIS — Z951 Presence of aortocoronary bypass graft: Secondary | ICD-10-CM | POA: Diagnosis not present

## 2013-12-04 DIAGNOSIS — J440 Chronic obstructive pulmonary disease with acute lower respiratory infection: Secondary | ICD-10-CM

## 2013-12-04 DIAGNOSIS — S0180XA Unspecified open wound of other part of head, initial encounter: Secondary | ICD-10-CM | POA: Diagnosis not present

## 2013-12-04 DIAGNOSIS — I25810 Atherosclerosis of coronary artery bypass graft(s) without angina pectoris: Secondary | ICD-10-CM | POA: Diagnosis present

## 2013-12-04 DIAGNOSIS — M25552 Pain in left hip: Secondary | ICD-10-CM | POA: Diagnosis present

## 2013-12-04 DIAGNOSIS — G589 Mononeuropathy, unspecified: Secondary | ICD-10-CM | POA: Diagnosis not present

## 2013-12-04 DIAGNOSIS — J4489 Other specified chronic obstructive pulmonary disease: Secondary | ICD-10-CM | POA: Insufficient documentation

## 2013-12-04 DIAGNOSIS — S0191XA Laceration without foreign body of unspecified part of head, initial encounter: Secondary | ICD-10-CM

## 2013-12-04 DIAGNOSIS — K5900 Constipation, unspecified: Secondary | ICD-10-CM

## 2013-12-04 DIAGNOSIS — J42 Unspecified chronic bronchitis: Secondary | ICD-10-CM

## 2013-12-04 DIAGNOSIS — R609 Edema, unspecified: Secondary | ICD-10-CM | POA: Diagnosis present

## 2013-12-04 DIAGNOSIS — L989 Disorder of the skin and subcutaneous tissue, unspecified: Secondary | ICD-10-CM

## 2013-12-04 DIAGNOSIS — S79912A Unspecified injury of left hip, initial encounter: Secondary | ICD-10-CM

## 2013-12-04 DIAGNOSIS — G8929 Other chronic pain: Secondary | ICD-10-CM | POA: Diagnosis present

## 2013-12-04 DIAGNOSIS — K922 Gastrointestinal hemorrhage, unspecified: Secondary | ICD-10-CM

## 2013-12-04 DIAGNOSIS — F0390 Unspecified dementia without behavioral disturbance: Secondary | ICD-10-CM | POA: Diagnosis present

## 2013-12-04 DIAGNOSIS — N189 Chronic kidney disease, unspecified: Secondary | ICD-10-CM

## 2013-12-04 DIAGNOSIS — W19XXXA Unspecified fall, initial encounter: Secondary | ICD-10-CM

## 2013-12-04 DIAGNOSIS — G20 Parkinson's disease: Secondary | ICD-10-CM | POA: Diagnosis present

## 2013-12-04 DIAGNOSIS — N390 Urinary tract infection, site not specified: Secondary | ICD-10-CM

## 2013-12-04 DIAGNOSIS — I509 Heart failure, unspecified: Secondary | ICD-10-CM | POA: Diagnosis not present

## 2013-12-04 DIAGNOSIS — N179 Acute kidney failure, unspecified: Secondary | ICD-10-CM

## 2013-12-04 DIAGNOSIS — R21 Rash and other nonspecific skin eruption: Secondary | ICD-10-CM

## 2013-12-04 DIAGNOSIS — Y92129 Unspecified place in nursing home as the place of occurrence of the external cause: Secondary | ICD-10-CM

## 2013-12-04 DIAGNOSIS — F419 Anxiety disorder, unspecified: Secondary | ICD-10-CM

## 2013-12-04 LAB — COMPREHENSIVE METABOLIC PANEL
ALT: 13 U/L (ref 0–35)
ANION GAP: 13 (ref 5–15)
AST: 20 U/L (ref 0–37)
Albumin: 2.8 g/dL — ABNORMAL LOW (ref 3.5–5.2)
Alkaline Phosphatase: 96 U/L (ref 39–117)
BUN: 16 mg/dL (ref 6–23)
CO2: 25 meq/L (ref 19–32)
Calcium: 8.9 mg/dL (ref 8.4–10.5)
Chloride: 104 mEq/L (ref 96–112)
Creatinine, Ser: 0.96 mg/dL (ref 0.50–1.10)
GFR calc Af Amer: 57 mL/min — ABNORMAL LOW (ref >90)
GFR, EST NON AFRICAN AMERICAN: 49 mL/min — AB (ref >90)
GLUCOSE: 94 mg/dL (ref 70–99)
Potassium: 3.9 mEq/L (ref 3.7–5.3)
Sodium: 142 mEq/L (ref 137–147)
TOTAL PROTEIN: 6.6 g/dL (ref 6.0–8.3)
Total Bilirubin: 0.3 mg/dL (ref 0.3–1.2)

## 2013-12-04 LAB — CBC WITH DIFFERENTIAL/PLATELET
Basophils Absolute: 0.1 10*3/uL (ref 0.0–0.1)
Basophils Relative: 0 % (ref 0–1)
Eosinophils Absolute: 0.2 10*3/uL (ref 0.0–0.7)
Eosinophils Relative: 2 % (ref 0–5)
HCT: 38.8 % (ref 36.0–46.0)
HEMOGLOBIN: 12.9 g/dL (ref 12.0–15.0)
Lymphocytes Relative: 11 % — ABNORMAL LOW (ref 12–46)
Lymphs Abs: 1.6 10*3/uL (ref 0.7–4.0)
MCH: 32 pg (ref 26.0–34.0)
MCHC: 33.2 g/dL (ref 30.0–36.0)
MCV: 96.3 fL (ref 78.0–100.0)
Monocytes Absolute: 1.2 10*3/uL — ABNORMAL HIGH (ref 0.1–1.0)
Monocytes Relative: 8 % (ref 3–12)
NEUTROS ABS: 11.5 10*3/uL — AB (ref 1.7–7.7)
NEUTROS PCT: 79 % — AB (ref 43–77)
PLATELETS: 331 10*3/uL (ref 150–400)
RBC: 4.03 MIL/uL (ref 3.87–5.11)
RDW: 14.1 % (ref 11.5–15.5)
WBC: 14.6 10*3/uL — ABNORMAL HIGH (ref 4.0–10.5)

## 2013-12-04 LAB — PROTIME-INR
INR: 0.99 (ref 0.00–1.49)
PROTHROMBIN TIME: 13.1 s (ref 11.6–15.2)

## 2013-12-04 LAB — ABO/RH: ABO/RH(D): A POS

## 2013-12-04 LAB — TYPE AND SCREEN
ABO/RH(D): A POS
Antibody Screen: NEGATIVE

## 2013-12-04 MED ORDER — POTASSIUM CHLORIDE CRYS ER 10 MEQ PO TBCR
10.0000 meq | EXTENDED_RELEASE_TABLET | Freq: Two times a day (BID) | ORAL | Status: DC
  Administered 2013-12-04 – 2013-12-05 (×2): 10 meq via ORAL
  Filled 2013-12-04 (×4): qty 1

## 2013-12-04 MED ORDER — ONDANSETRON HCL 4 MG/2ML IJ SOLN: 4.0000 mg | Freq: Four times a day (QID) | INTRAMUSCULAR | Status: DC | PRN

## 2013-12-04 MED ORDER — SODIUM CHLORIDE 0.9 % IV SOLN: INTRAVENOUS | Status: DC

## 2013-12-04 MED ORDER — ACETAMINOPHEN 325 MG PO TABS
650.0000 mg | ORAL_TABLET | Freq: Four times a day (QID) | ORAL | Status: DC
  Administered 2013-12-04 – 2013-12-05 (×4): 650 mg via ORAL
  Filled 2013-12-04 (×4): qty 2

## 2013-12-04 MED ORDER — ACYCLOVIR 200 MG PO CAPS
200.0000 mg | ORAL_CAPSULE | Freq: Every day | ORAL | Status: DC
  Administered 2013-12-05: 200 mg via ORAL
  Filled 2013-12-04 (×2): qty 1

## 2013-12-04 MED ORDER — SENNOSIDES-DOCUSATE SODIUM 8.6-50 MG PO TABS
1.0000 | ORAL_TABLET | Freq: Two times a day (BID) | ORAL | Status: DC
  Administered 2013-12-04 – 2013-12-05 (×2): 1 via ORAL
  Filled 2013-12-04 (×2): qty 1

## 2013-12-04 MED ORDER — ONDANSETRON HCL 4 MG PO TABS: 4.0000 mg | ORAL_TABLET | Freq: Four times a day (QID) | ORAL | Status: DC | PRN

## 2013-12-04 MED ORDER — TIOTROPIUM BROMIDE MONOHYDRATE 18 MCG IN CAPS
18.0000 ug | ORAL_CAPSULE | Freq: Every day | RESPIRATORY_TRACT | Status: DC
  Administered 2013-12-05: 18 ug via RESPIRATORY_TRACT
  Filled 2013-12-04 (×2): qty 5

## 2013-12-04 MED ORDER — MEMANTINE HCL ER 7 MG PO CP24
14.0000 mg | ORAL_CAPSULE | Freq: Every day | ORAL | Status: DC
  Administered 2013-12-05: 14 mg via ORAL
  Filled 2013-12-04 (×2): qty 2

## 2013-12-04 MED ORDER — SODIUM CHLORIDE 0.9 % IV SOLN
1000.0000 mL | INTRAVENOUS | Status: DC
  Administered 2013-12-04: 1000 mL via INTRAVENOUS

## 2013-12-04 MED ORDER — ISOSORBIDE MONONITRATE ER 30 MG PO TB24
30.0000 mg | ORAL_TABLET | Freq: Every day | ORAL | Status: DC
  Administered 2013-12-05: 30 mg via ORAL
  Filled 2013-12-04 (×2): qty 1

## 2013-12-04 MED ORDER — ONDANSETRON HCL 4 MG/2ML IJ SOLN
4.0000 mg | Freq: Once | INTRAMUSCULAR | Status: AC
  Administered 2013-12-04: 4 mg via INTRAVENOUS
  Filled 2013-12-04: qty 2

## 2013-12-04 MED ORDER — CALCIUM CARBONATE-VITAMIN D 500-200 MG-UNIT PO TABS
1.0000 | ORAL_TABLET | Freq: Every day | ORAL | Status: DC
  Administered 2013-12-05: 1 via ORAL
  Filled 2013-12-04 (×2): qty 1

## 2013-12-04 MED ORDER — NEBIVOLOL HCL 10 MG PO TABS
10.0000 mg | ORAL_TABLET | Freq: Every day | ORAL | Status: DC
  Administered 2013-12-05: 10 mg via ORAL
  Filled 2013-12-04 (×2): qty 1

## 2013-12-04 MED ORDER — GABAPENTIN 100 MG PO CAPS
100.0000 mg | ORAL_CAPSULE | Freq: Three times a day (TID) | ORAL | Status: DC
  Administered 2013-12-04 – 2013-12-05 (×3): 100 mg via ORAL
  Filled 2013-12-04 (×6): qty 1

## 2013-12-04 MED ORDER — CELECOXIB 200 MG PO CAPS
200.0000 mg | ORAL_CAPSULE | Freq: Every day | ORAL | Status: DC
  Administered 2013-12-05: 200 mg via ORAL
  Filled 2013-12-04 (×2): qty 1

## 2013-12-04 MED ORDER — MOMETASONE FURO-FORMOTEROL FUM 100-5 MCG/ACT IN AERO
2.0000 | INHALATION_SPRAY | Freq: Two times a day (BID) | RESPIRATORY_TRACT | Status: DC
  Administered 2013-12-04 – 2013-12-05 (×2): 2 via RESPIRATORY_TRACT
  Filled 2013-12-04 (×2): qty 8.8

## 2013-12-04 MED ORDER — IPRATROPIUM-ALBUTEROL 0.5-2.5 (3) MG/3ML IN SOLN: 3.0000 mL | Freq: Four times a day (QID) | RESPIRATORY_TRACT | Status: DC | PRN

## 2013-12-04 MED ORDER — FUROSEMIDE 40 MG PO TABS
40.0000 mg | ORAL_TABLET | Freq: Every day | ORAL | Status: DC
  Administered 2013-12-05: 40 mg via ORAL
  Filled 2013-12-04: qty 1
  Filled 2013-12-04: qty 2

## 2013-12-04 MED ORDER — ACETAMINOPHEN 325 MG PO TABS: 325.0000 mg | ORAL_TABLET | Freq: Three times a day (TID) | ORAL | Status: DC | PRN

## 2013-12-04 MED ORDER — LOSARTAN POTASSIUM 50 MG PO TABS
50.0000 mg | ORAL_TABLET | Freq: Two times a day (BID) | ORAL | Status: DC
  Administered 2013-12-04 – 2013-12-05 (×2): 50 mg via ORAL
  Filled 2013-12-04 (×4): qty 1

## 2013-12-04 MED ORDER — TRAMADOL HCL 50 MG PO TABS
50.0000 mg | ORAL_TABLET | Freq: Four times a day (QID) | ORAL | Status: DC
  Administered 2013-12-04 – 2013-12-05 (×3): 50 mg via ORAL
  Filled 2013-12-04 (×3): qty 1

## 2013-12-04 MED ORDER — ENOXAPARIN SODIUM 30 MG/0.3ML ~~LOC~~ SOLN
30.0000 mg | SUBCUTANEOUS | Status: DC
  Administered 2013-12-04 – 2013-12-05 (×2): 30 mg via SUBCUTANEOUS
  Filled 2013-12-04 (×2): qty 0.3

## 2013-12-04 MED ORDER — DULOXETINE HCL 30 MG PO CPEP
30.0000 mg | ORAL_CAPSULE | Freq: Two times a day (BID) | ORAL | Status: DC
  Administered 2013-12-04 – 2013-12-05 (×2): 30 mg via ORAL
  Filled 2013-12-04 (×4): qty 1

## 2013-12-04 MED ORDER — FENTANYL CITRATE 0.05 MG/ML IJ SOLN
50.0000 ug | INTRAMUSCULAR | Status: DC | PRN
  Administered 2013-12-04: 50 ug via INTRAVENOUS
  Filled 2013-12-04: qty 2

## 2013-12-04 MED ORDER — LEVOTHYROXINE SODIUM 25 MCG PO TABS
25.0000 ug | ORAL_TABLET | Freq: Every day | ORAL | Status: DC
  Administered 2013-12-05: 25 ug via ORAL
  Filled 2013-12-04 (×2): qty 1

## 2013-12-04 MED ORDER — PANTOPRAZOLE SODIUM 40 MG PO TBEC
40.0000 mg | DELAYED_RELEASE_TABLET | Freq: Two times a day (BID) | ORAL | Status: DC
Start: 2013-12-04 — End: 2013-12-05
  Administered 2013-12-04 – 2013-12-05 (×2): 40 mg via ORAL
  Filled 2013-12-04 (×2): qty 1

## 2013-12-04 NOTE — Treatment Plan (Signed)
Xrays reviewed from today and the last few years - show chronic problem with progressive erosion of the femoral head and protrusion of the hardware. No evidence of fracture on today's xrays. No indications for surgery. Admit to medicine for pain control. Patient is mostly nonambulatory according to prior records. Will come by to see patient later today vs in the morning and discuss this with pt and family.  Mayra ReelN. Michael Kasson Lamere, MD [facility] [phone] 8:57 AM

## 2013-12-04 NOTE — ED Provider Notes (Signed)
Medical screening examination/treatment/procedure(s) were conducted as a shared visit with non-physician practitioner(s) and myself.  I personally evaluated the patient during the encounter.   EKG Interpretation   Date/Time:  Saturday December 04 2013 08:12:34 EDT Ventricular Rate:  63 PR Interval:  138 QRS Duration: 97 QT Interval:  455 QTC Calculation: 466 R Axis:   5 Text Interpretation:  Sinus rhythm Low voltage, extremity and precordial  leads Borderline repolarization abnormality artifact noted Confirmed by  Bebe ShaggyWICKLINE  MD, Liddy Deam ([id number]) on 12/04/2013 8:15:20 AM        Joya Gaskinsonald W Idelle Reimann, MD 12/04/13 [phone]

## 2013-12-04 NOTE — ED Notes (Signed)
Pt here by ems for unwitnessed fall, pt was getting out of bed when she fell, denies known loc, unable to tolerate on LSB due to wheezing, left hip pain, shortening and rotation noted, also has lac to right fore head and right wrist pain and possible deformity.

## 2013-12-04 NOTE — H&P (Signed)
History and Physical  [name] [name] NWG:[id number] DOB: [birth year] DOA: 12/04/2013  Referring physician: Marlon Pel, ED PA PCP: Kimber Relic, MD  Outpatient Specialists:  1. Orthopedics: Dr. Norlene Campbell.  Chief Complaint: Left hip pain following a fall at nursing home.  HPI: [name] is a 80 y.o. female nursing home resident, with extensive past medical history including advanced dementia, hypertension, COPD, chronic diastolic CHF, CAD, GERD, Parkinson's disease, neuropathy, hypothyroid, hyperlipidemia, anxiety and depression, chronic pain in left hip status post left hip surgical repair in 2002, chronic leg edema and DO NOT RESUSCITATE, transferred from nursing home to the ED on 12/04/13 following a fall at nursing home with associated left hip pain. Unable to obtain significant history from patient secondary to advanced dementia but she states that she fell and denies LOC. As per discussion with ED PA, patient was recently given a walker for ambulating and when she got out of bed early this morning she did not use her walker and fell hitting her head and the left hip to the floor. Patient doesn't think that she passed out. She currently denies chest or left hip pain. As per PCPs notes, patient is wheelchair mobile. In the ED, imaging was suggestive of left hip fracture. ED has discussed with orthopedics who have reviewed imaging and feel that it is a chronic problem with progressive erosion of the femoral head and protrusion of hardware and plan nonsurgical management. Hospitalist admission requested.   Review of Systems: All systems reviewed and apart from history of presenting illness, are negative.  Past Medical History  Diagnosis Date  . Hypertension   . COPD (chronic obstructive pulmonary disease)   . CHF (congestive heart failure)   . Coronary artery disease   . Renal disorder   . GERD (gastroesophageal reflux disease)   . Osteoporosis   . Syncope   . Dementia   .  Anemia   . Parkinson disease   . Neuropathy   . Thyroid disease   . Hyperlipidemia   . Anxiety   . Depression   . Chronic pain 11/01/2011   Past Surgical History  Procedure Laterality Date  . Fracture surgery    . Esophagogastroduodenoscopy  05/05/2012    Procedure: ESOPHAGOGASTRODUODENOSCOPY (EGD);  Surgeon: [name], MD;  Location: [facility] ENDOSCOPY;  Service: Endoscopy;  Laterality: N/A;  Pediatric upper endoscope   Social History:  reports that she has never smoked. She does not have any smokeless tobacco history on file. She reports that she does not drink alcohol or use illicit drugs.   No Known Allergies  No family history on file. unable to obtain.  Prior to Admission medications   Medication Sig Start Date End Date Taking? Authorizing Provider  acetaminophen (TYLENOL) 325 MG tablet Take 650 mg by mouth 4 (four) times daily.    Yes Historical Provider, MD  acyclovir (ZOVIRAX) 200 MG capsule Take 200 mg by mouth daily.   Yes Historical Provider, MD  amoxicillin (AMOXIL) 500 MG capsule Take 4 capsules by mouth prior to dental procedure as directed by the access dental care staff   Yes Historical Provider, MD  ARTIFICIAL TEAR OP Place 1-2 drops into both eyes 4 (four) times daily.   Yes Historical Provider, MD  calcium-vitamin D (OSCAL WITH D) 500-200 MG-UNIT per tablet Take 1 tablet by mouth daily.   Yes Historical Provider, MD  celecoxib (CELEBREX) 200 MG capsule Take 200 mg by mouth daily.   Yes Historical Provider, MD  DULoxetine (CYMBALTA) 30 MG capsule Take 30 mg by mouth 2 (two) times daily.   Yes Historical Provider, MD  Fluticasone-Salmeterol (ADVAIR) 100-50 MCG/DOSE AEPB Inhale 1 puff into the lungs every 12 (twelve) hours.   Yes Historical Provider, MD  furosemide (LASIX) 20 MG tablet Take 20 mg by mouth daily.   Yes Historical Provider, MD  gabapentin (NEURONTIN) 100 MG capsule Take 100 mg by mouth 3 (three) times daily.   Yes Historical Provider, MD    ipratropium-albuterol (DUONEB) 0.5-2.5 (3) MG/3ML SOLN Take 3 mLs by nebulization every 6 (six) hours as needed (shortness of breath).   Yes Historical Provider, MD  isosorbide mononitrate (IMDUR) 30 MG 24 hr tablet Take 30 mg by mouth daily.   Yes Historical Provider, MD  levothyroxine (SYNTHROID, LEVOTHROID) 25 MCG tablet Take 25 mcg by mouth daily.    Yes Historical Provider, MD  losartan (COZAAR) 50 MG tablet Take 50 mg by mouth 2 (two) times daily.   Yes Historical Provider, MD  Memantine HCl ER (NAMENDA XR) 7 MG CP24 Take 14 mg by mouth daily.   Yes Historical Provider, MD  nebivolol (BYSTOLIC) 10 MG tablet Take 1 tablet (10 mg total) by mouth daily. 09/28/13  Yes Man Mast X, NP  omeprazole (PRILOSEC) 40 MG capsule Take 40 mg by mouth 2 (two) times daily.   Yes Historical Provider, MD  potassium chloride (MICRO-K) 10 MEQ CR capsule Take 10 mEq by mouth 2 (two) times daily.   Yes Historical Provider, MD  senna-docusate (SENOKOT S) 8.6-50 MG per tablet Take 1 tablet by mouth 2 (two) times daily. 10/19/13  Yes Man Mast X, NP  tiotropium (SPIRIVA) 18 MCG inhalation capsule Place 18 mcg into inhaler and inhale daily.   Yes Historical Provider, MD  traMADol (ULTRAM) 50 MG tablet Take 50 mg by mouth every 6 (six) hours. 1 by mouth three times daily (6 am, 12pm, 6 pm, 12 midnight) 04/05/13  Yes Kermit Baloiffany L Reed, DO   Physical Exam: Filed Vitals:   12/04/13 0557 12/04/13 0600 12/04/13 0628 12/04/13 0915  BP: 184/83 194/77  134/71  Pulse: 63 61 60 62  Temp: 97.6 F (36.4 C)     TempSrc: Oral     Resp: 18   13  Height: 5' (1.524 m)     Weight: 71.668 kg (158 lb)     SpO2: 93% 100% 99% 100%     General exam: Moderately built and nourished pleasant elderly female patient, lying comfortably propped up on the gurney in no obvious distress.  Head, eyes and ENT: Patient has approximately 1 inch vertical laceration over the lateral aspect of right eyebrow which has been sutured by EDP. Mild right  periorbital ecchymosis. Pupils equally reacting to light and accommodation. Oral mucosa moist.  Neck: Supple. No JVD, carotid bruit or thyromegaly.  Lymphatics: No lymphadenopathy.  Respiratory system: Slightly diminished breath sounds in the bases with occasional basal crackles. Rest of lung [place] are clear to auscultation.. No increased work of breathing.  Cardiovascular system: S1 and S2 heard, RRR. No JVD, murmurs, gallops, clicks. 1+ pitting bilateral leg edema.  Gastrointestinal system: Abdomen is nondistended, soft and nontender. Normal bowel sounds heard. No organomegaly or masses appreciated.  Central nervous system: Alert and oriented to person and place but not to time. No focal neurological deficits.  Extremities: Symmetric 5 x 5 power. Peripheral pulses symmetrically felt. Left lower extremity is shortened and externally rotated. Movements are restricted secondary to left hip pain. Surgical scars  seen over lateral aspect of left thigh. Small superficial skin tear over right wrist-Steri-Strips have been applied. Faint erythematous changes over bilateral legs without tenderness or significant warmth-probably chronic and not suggestive of cellulitis.  Skin: No rashes or acute findings.  Musculoskeletal system: Negative exam.  Psychiatry: Pleasant and cooperative.   Labs on Admission:  Basic Metabolic Panel:  Recent Labs Lab 11/29/13 12/04/13 0631  NA 140 142  K 4.3 3.9  CL  --  104  CO2  --  25  GLUCOSE  --  94  BUN 14 16  CREATININE 0.9 0.96  CALCIUM  --  8.9   Liver Function Tests:  Recent Labs Lab 12/04/13 0631  AST 20  ALT 13  ALKPHOS 96  BILITOT 0.3  PROT 6.6  ALBUMIN 2.8*   No results found for this basename: LIPASE, AMYLASE,  in the last 168 hours No results found for this basename: AMMONIA,  in the last 168 hours CBC:  Recent Labs Lab 12/04/13 0631  WBC 14.6*  NEUTROABS 11.5*  HGB 12.9  HCT 38.8  MCV 96.3  PLT 331   Cardiac  Enzymes: No results found for this basename: CKTOTAL, CKMB, CKMBINDEX, TROPONINI,  in the last 168 hours  BNP (last 3 results) No results found for this basename: PROBNP,  in the last 8760 hours CBG: No results found for this basename: GLUCAP,  in the last 168 hours  Radiological Exams on Admission: Dg Chest 1 View  12/04/2013   CLINICAL DATA:  FALL FALL  EXAM: CHEST - 1 VIEW  COMPARISON:  05/05/2012  FINDINGS: Increase in small bilateral pleural effusions. Mild cardiomegaly stable. Mild perihilar interstitial edema or infiltrates. Atheromatous aorta. Cervical fixation hardware partially seen.  IMPRESSION: 1. Mild cardiomegaly and perihilar interstitial edema/infiltrates. 2. Interval increase pleural effusions.   Electronically Signed   By: Oley Balm M.D.   On: 12/04/2013 07:30   Dg Wrist Complete Right  12/04/2013   CLINICAL DATA:  FALL  EXAM: RIGHT WRIST - COMPLETE 3+ VIEW  COMPARISON:  06/27/2010  FINDINGS: Diffuse osteopenia. Advanced degenerative change at the first [facility] articulation. Carpal rows intact. Interval healing across the previously noted distal radial fracture, with neutral angulation of the distal radial articular surface as before. Chondrocalcinosis in the TFCC.  IMPRESSION: 1. Osteopenia and degenerative change without fracture or other acute abnormality.   Electronically Signed   By: Oley Balm M.D.   On: 12/04/2013 07:32   Dg Hip Complete Left  12/04/2013   CLINICAL DATA:  FALL FALL  EXAM: LEFT HIP - COMPLETE 2+ VIEW  COMPARISON:  10/12/2010  FINDINGS: Sliding screw and compression plate in the proximal femur. There has been fracture/lysis of the femoral head and neck with a residual fragment of the femoral head noted in the acetabulum. There has been interval migration and erosion into supra-acetabular region by threads and tip of the sliding screw.  Components of right hip arthroplasty articulating normally, femoral stem not seen. Degenerative changes in the  visualized lower lumbar spine.  IMPRESSION: 1. Fragmentation/lysis of the left femoral head and neck with migration and erosion of previously placed fixation hardware into the supra-acetabular region.   Electronically Signed   By: Oley Balm M.D.   On: 12/04/2013 07:25   Dg Femur Left  12/04/2013   CLINICAL DATA:  FALL FALL  EXAM: LEFT FEMUR - 2 VIEW  COMPARISON:  12/12/2010  FINDINGS: No acute fracture. Diffuse osteopenia. Sliding screw and compression plate in the proximal femur as  before. There has been interval fragmentation/ lysis of the femoral head and neck, with superior migration of the fixation hardware with respect to the acetabulum, and erosion of the sliding screw tip into the supra-acetabular region.  IMPRESSION: 1. Interval lysis/fragmentation of femoral head and resultant migration of fixation hardware into the supra-acetabular region.   Electronically Signed   By: Oley Balm M.D.   On: 12/04/2013 07:28   Ct Head Wo Contrast  12/04/2013   CLINICAL DATA:  FALL FALL  EXAM: CT HEAD WITHOUT CONTRAST  CT CERVICAL SPINE WITHOUT CONTRAST  TECHNIQUE: Multidetector CT imaging of the head and cervical spine was performed following the standard protocol without intravenous contrast. Multiplanar CT image reconstructions of the cervical spine were also generated.  COMPARISON:  10/12/2010  FINDINGS: CT HEAD FINDINGS  Atherosclerotic and physiologic intracranial calcifications. Fluid level in the sphenoid sinus. Stable asymmetric mineralization of basal ganglia right greater than left. Diffuse parenchymal atrophy. Patchy areas of hypoattenuation in deep and periventricular white matter bilaterally. Negative for acute intracranial hemorrhage, mass lesion, acute infarction, midline shift, or mass-effect. Acute infarct may be inapparent on noncontrast CT. Ventricles and sulci symmetric. Bone windows demonstrate no focal lesion.  CT CERVICAL SPINE FINDINGS  Solid-appearing instrumented ACDF C3-4. Facet  DJD C4-5 allows 2-3 mm anterolisthesis, contributes to bilateral foraminal encroachment. There is facet DJD bilaterally C5-6 and C6-7. Negative for fracture. No prevertebral soft tissue swelling. Some streak artifact from dental restorations degrades portions of the study.  IMPRESSION: 1. Negative for bleed or other acute intracranial process. 2. Negative for cervical fracture or dislocation. 3. Stable parenchymal atrophy and nonspecific white matter changes. 4. Mild anterolisthesis C4-5 may be secondary to facet disease. Flexion/extension radiographs would be required to exclude dynamic instability.   Electronically Signed   By: Oley Balm M.D.   On: 12/04/2013 08:08   Ct Cervical Spine Wo Contrast  12/04/2013   CLINICAL DATA:  FALL FALL  EXAM: CT HEAD WITHOUT CONTRAST  CT CERVICAL SPINE WITHOUT CONTRAST  TECHNIQUE: Multidetector CT imaging of the head and cervical spine was performed following the standard protocol without intravenous contrast. Multiplanar CT image reconstructions of the cervical spine were also generated.  COMPARISON:  10/12/2010  FINDINGS: CT HEAD FINDINGS  Atherosclerotic and physiologic intracranial calcifications. Fluid level in the sphenoid sinus. Stable asymmetric mineralization of basal ganglia right greater than left. Diffuse parenchymal atrophy. Patchy areas of hypoattenuation in deep and periventricular white matter bilaterally. Negative for acute intracranial hemorrhage, mass lesion, acute infarction, midline shift, or mass-effect. Acute infarct may be inapparent on noncontrast CT. Ventricles and sulci symmetric. Bone windows demonstrate no focal lesion.  CT CERVICAL SPINE FINDINGS  Solid-appearing instrumented ACDF C3-4. Facet DJD C4-5 allows 2-3 mm anterolisthesis, contributes to bilateral foraminal encroachment. There is facet DJD bilaterally C5-6 and C6-7. Negative for fracture. No prevertebral soft tissue swelling. Some streak artifact from dental restorations degrades  portions of the study.  IMPRESSION: 1. Negative for bleed or other acute intracranial process. 2. Negative for cervical fracture or dislocation. 3. Stable parenchymal atrophy and nonspecific white matter changes. 4. Mild anterolisthesis C4-5 may be secondary to facet disease. Flexion/extension radiographs would be required to exclude dynamic instability.   Electronically Signed   By: Oley Balm M.D.   On: 12/04/2013 08:08    EKG: Independently reviewed. Sinus rhythm, low voltage the without acute changes.  Assessment/Plan Principal Problem:   Pain in left hip Active Problems:   GERD (gastroesophageal reflux disease)   Dementia  Parkinson disease   Hyperlipidemia   Chronic pain   Unspecified hypothyroidism   CAD (coronary artery disease) of artery bypass graft   DNR (do not resuscitate)   HTN (hypertension)   Chronic diastolic CHF (congestive heart failure)   Edema   Fall at nursing home   Left hip pain   1. Left hip pain, status post fall at nursing home: Admit to medical floor for observation and evaluation. Orthopedics has evaluated imaging studies and indicates that this is a chronic finding without fracture and plan nonsurgical management. At baseline patient is non-ambulatory. Continue chronic pain medications including scheduled tramadol and acetaminophen. Check urine microscopy. 2. Status post fall at nursing home, right forehead laceration and right wrist superficial skin tear: Fall secondary to unsteady gait and chronic weakness. Forehead laceration has been sutured and Steri-Strips applied to wrist tear. 3. Chronic leg edema/history of chronic diastolic CHF: Will increase the dose of Lasix while hospitalized and monitor. No features suggestive of cellulitis. 4. Advanced dementia: Mental status at baseline. Continue Namenda. 5. GERD: Continue twice a day PPI. Prior history of GI bleed. 6. Hypothyroidism: Continue levothyroxin. 7. Neuropathy: Continue gabapentin and  scheduled Tylenol. 8. Anxiety: Continue Cymbalta. 9. Chronic bronchitis/COPD-history of chronic cough. Stable. 10. Hypertension: Initial elevated blood pressure likely precipitated by pain. Continue by Bysystolic and Cozaar 11. DO NOT RESUSCITATE: Patient presented to the ED with gold form and MOST form from SNF.    Code Status: DO NOT RESUSCITATE  Family Communication: Unable to reach son/HCPOA Mr. Taytem Ghattas- left message. Disposition Plan: Admit for observation and possible return to [facility] 12/05/13.   Time spent: 55 minutes.  Marcellus Scott, MD, FACP, FHM. [facility] Pager [phone]  If 7PM-7AM, please contact night-coverage [email or web address] Password [facility] 12/04/2013, 9:28 AM

## 2013-12-04 NOTE — ED Provider Notes (Signed)
CSN: [id number]     Arrival date & time 12/04/13  4098 History   None    Chief Complaint  Patient presents with  . Fall     (Consider location/radiation/quality/duration/timing/severity/associated sxs/prior Treatment) HPI  Patient to the ER by EMS after falling. She reports recently being given a walker for ambulating and when she got out of bed early this morning she didn't use her walker and fell. She reports hitting her head on the floor and injuring her left hip. She doesn't think she passed out. She has sustained a skin tear to right wrist but denies pain. She tells me that mainly she has no pain as long as she sits still.  Her PMH is positive for hypertension, COPD, CHF, CAD, GERD, syncope, dementia, Parkinsons, neuropathy, thyroid disease, depression, anxiety and chronic pain.  Hip Replacement by Dr. Soyla Murphy in February of 2012.  Past Medical History  Diagnosis Date  . Hypertension   . COPD (chronic obstructive pulmonary disease)   . CHF (congestive heart failure)   . Coronary artery disease   . Renal disorder   . GERD (gastroesophageal reflux disease)   . Osteoporosis   . Syncope   . Dementia   . Anemia   . Parkinson disease   . Neuropathy   . Thyroid disease   . Hyperlipidemia   . Anxiety   . Depression   . Chronic pain 11/01/2011   Past Surgical History  Procedure Laterality Date  . Fracture surgery    . Esophagogastroduodenoscopy  05/05/2012    Procedure: ESOPHAGOGASTRODUODENOSCOPY (EGD);  Surgeon: [name], MD;  Location: [facility] ENDOSCOPY;  Service: Endoscopy;  Laterality: N/A;  Pediatric upper endoscope   No family history on file. History  Substance Use Topics  . Smoking status: Never Smoker   . Smokeless tobacco: Not on file  . Alcohol Use: No   OB History   Grav Para Term Preterm Abortions TAB SAB Ect Mult Living                 Review of Systems  Musculoskeletal: Positive for arthralgias.  Skin: Positive for wound.  All other systems  reviewed and are negative.      Allergies  Review of patient's allergies indicates no known allergies.  Home Medications   Prior to Admission medications   Medication Sig Start Date End Date Taking? Authorizing Provider  acetaminophen (TYLENOL) 325 MG tablet Take 650 mg by mouth 4 (four) times daily.    Yes Historical Provider, MD  acyclovir (ZOVIRAX) 200 MG capsule Take 200 mg by mouth daily.   Yes Historical Provider, MD  amoxicillin (AMOXIL) 500 MG capsule Take 4 capsules by mouth prior to dental procedure as directed by the access dental care staff   Yes Historical Provider, MD  ARTIFICIAL TEAR OP Place 1-2 drops into both eyes 4 (four) times daily.   Yes Historical Provider, MD  calcium-vitamin D (OSCAL WITH D) 500-200 MG-UNIT per tablet Take 1 tablet by mouth daily.   Yes Historical Provider, MD  celecoxib (CELEBREX) 200 MG capsule Take 200 mg by mouth daily.   Yes Historical Provider, MD  DULoxetine (CYMBALTA) 30 MG capsule Take 30 mg by mouth 2 (two) times daily.   Yes Historical Provider, MD  Fluticasone-Salmeterol (ADVAIR) 100-50 MCG/DOSE AEPB Inhale 1 puff into the lungs every 12 (twelve) hours.   Yes Historical Provider, MD  furosemide (LASIX) 20 MG tablet Take 20 mg by mouth daily.   Yes Historical Provider, MD  gabapentin (  NEURONTIN) 100 MG capsule Take 100 mg by mouth 3 (three) times daily.   Yes Historical Provider, MD  ipratropium-albuterol (DUONEB) 0.5-2.5 (3) MG/3ML SOLN Take 3 mLs by nebulization every 6 (six) hours as needed (shortness of breath).   Yes Historical Provider, MD  isosorbide mononitrate (IMDUR) 30 MG 24 hr tablet Take 30 mg by mouth daily.   Yes Historical Provider, MD  levothyroxine (SYNTHROID, LEVOTHROID) 25 MCG tablet Take 25 mcg by mouth daily.    Yes Historical Provider, MD  losartan (COZAAR) 50 MG tablet Take 50 mg by mouth 2 (two) times daily.   Yes Historical Provider, MD  Memantine HCl ER (NAMENDA XR) 7 MG CP24 Take 14 mg by mouth daily.   Yes  Historical Provider, MD  nebivolol (BYSTOLIC) 10 MG tablet Take 1 tablet (10 mg total) by mouth daily. 09/28/13  Yes Man Mast X, NP  omeprazole (PRILOSEC) 40 MG capsule Take 40 mg by mouth 2 (two) times daily.   Yes Historical Provider, MD  potassium chloride (MICRO-K) 10 MEQ CR capsule Take 10 mEq by mouth 2 (two) times daily.   Yes Historical Provider, MD  senna-docusate (SENOKOT S) 8.6-50 MG per tablet Take 1 tablet by mouth 2 (two) times daily. 10/19/13  Yes Man Mast X, NP  tiotropium (SPIRIVA) 18 MCG inhalation capsule Place 18 mcg into inhaler and inhale daily.   Yes Historical Provider, MD  traMADol (ULTRAM) 50 MG tablet Take 50 mg by mouth every 6 (six) hours. 1 by mouth three times daily (6 am, 12pm, 6 pm, 12 midnight) 04/05/13  Yes Lamine Laton L Reed, DO   BP 194/77  Pulse 60  Temp(Src) 97.6 F (36.4 C) (Oral)  Resp 18  Ht 5' (1.524 m)  Wt 158 lb (71.668 kg)  BMI 30.86 kg/m2  SpO2 99% Physical Exam  Nursing note and vitals reviewed. Constitutional: She appears well-developed and well-nourished. No distress.  No LSB or C-collar in place  HENT:  Head: Normocephalic. Head is with raccoon's eyes (right eye), with contusion and with laceration (right eyebrow laceration). Head is without right periorbital erythema and without left periorbital erythema.    Eyes: Conjunctivae, EOM and lids are normal. Pupils are equal, round, and reactive to light. Lids are everted and swept, no foreign bodies found.  Neck: Normal range of motion. Neck supple. No spinous process tenderness and no muscular tenderness present. Normal range of motion present.  Cardiovascular: Normal rate and regular rhythm.   Pulmonary/Chest: Effort normal. She has wheezes (mild diffuse wheezing).  Abdominal: Soft. Bowel sounds are normal. There is no tenderness. There is no rebound, no guarding and no CVA tenderness.  Musculoskeletal:       Left hip: She exhibits decreased range of motion, decreased strength, tenderness, bony  tenderness and deformity (leg appears to be rotated outward). She exhibits no laceration.  Strong bilateral pedal pulses Skin tear to right wrist  Neurological: She is alert. A cranial nerve deficit and sensory deficit is present.  Unable to assess strength due to pain in left leg. Right leg strength and bilateral upper extremity strengths are adequate per age.  Skin: Skin is warm and dry.    ED Course  Procedures (including critical care time) Labs Review Labs Reviewed  CBC WITH DIFFERENTIAL - Abnormal; Notable for the following:    WBC 14.6 (*)    Neutrophils Relative % 79 (*)    Neutro Abs 11.5 (*)    Lymphocytes Relative 11 (*)    Monocytes Absolute 1.2 (*)  All other components within normal limits  COMPREHENSIVE METABOLIC PANEL - Abnormal; Notable for the following:    Albumin 2.8 (*)    GFR calc non Af Amer 49 (*)    GFR calc Af Amer 57 (*)    All other components within normal limits  PROTIME-INR  TYPE AND SCREEN  ABO/RH    Imaging Review Dg Chest 1 View  12/04/2013   CLINICAL DATA:  FALL FALL  EXAM: CHEST - 1 VIEW  COMPARISON:  05/05/2012  FINDINGS: Increase in small bilateral pleural effusions. Mild cardiomegaly stable. Mild perihilar interstitial edema or infiltrates. Atheromatous aorta. Cervical fixation hardware partially seen.  IMPRESSION: 1. Mild cardiomegaly and perihilar interstitial edema/infiltrates. 2. Interval increase pleural effusions.   Electronically Signed   By: Oley Balmaniel  Hassell M.D.   On: 12/04/2013 07:30   Dg Wrist Complete Right  12/04/2013   CLINICAL DATA:  FALL  EXAM: RIGHT WRIST - COMPLETE 3+ VIEW  COMPARISON:  06/27/2010  FINDINGS: Diffuse osteopenia. Advanced degenerative change at the first [facility] articulation. Carpal rows intact. Interval healing across the previously noted distal radial fracture, with neutral angulation of the distal radial articular surface as before. Chondrocalcinosis in the TFCC.  IMPRESSION: 1. Osteopenia and degenerative  change without fracture or other acute abnormality.   Electronically Signed   By: Oley Balmaniel  Hassell M.D.   On: 12/04/2013 07:32   Dg Hip Complete Left  12/04/2013   CLINICAL DATA:  FALL FALL  EXAM: LEFT HIP - COMPLETE 2+ VIEW  COMPARISON:  10/12/2010  FINDINGS: Sliding screw and compression plate in the proximal femur. There has been fracture/lysis of the femoral head and neck with a residual fragment of the femoral head noted in the acetabulum. There has been interval migration and erosion into supra-acetabular region by threads and tip of the sliding screw.  Components of right hip arthroplasty articulating normally, femoral stem not seen. Degenerative changes in the visualized lower lumbar spine.  IMPRESSION: 1. Fragmentation/lysis of the left femoral head and neck with migration and erosion of previously placed fixation hardware into the supra-acetabular region.   Electronically Signed   By: Oley Balmaniel  Hassell M.D.   On: 12/04/2013 07:25   Dg Femur Left  12/04/2013   CLINICAL DATA:  FALL FALL  EXAM: LEFT FEMUR - 2 VIEW  COMPARISON:  12/12/2010  FINDINGS: No acute fracture. Diffuse osteopenia. Sliding screw and compression plate in the proximal femur as before. There has been interval fragmentation/ lysis of the femoral head and neck, with superior migration of the fixation hardware with respect to the acetabulum, and erosion of the sliding screw tip into the supra-acetabular region.  IMPRESSION: 1. Interval lysis/fragmentation of femoral head and resultant migration of fixation hardware into the supra-acetabular region.   Electronically Signed   By: Oley Balmaniel  Hassell M.D.   On: 12/04/2013 07:28   Ct Head Wo Contrast  12/04/2013   CLINICAL DATA:  FALL FALL  EXAM: CT HEAD WITHOUT CONTRAST  CT CERVICAL SPINE WITHOUT CONTRAST  TECHNIQUE: Multidetector CT imaging of the head and cervical spine was performed following the standard protocol without intravenous contrast. Multiplanar CT image reconstructions of the  cervical spine were also generated.  COMPARISON:  10/12/2010  FINDINGS: CT HEAD FINDINGS  Atherosclerotic and physiologic intracranial calcifications. Fluid level in the sphenoid sinus. Stable asymmetric mineralization of basal ganglia right greater than left. Diffuse parenchymal atrophy. Patchy areas of hypoattenuation in deep and periventricular white matter bilaterally. Negative for acute intracranial hemorrhage, mass lesion, acute infarction, midline shift,  or mass-effect. Acute infarct may be inapparent on noncontrast CT. Ventricles and sulci symmetric. Bone windows demonstrate no focal lesion.  CT CERVICAL SPINE FINDINGS  Solid-appearing instrumented ACDF C3-4. Facet DJD C4-5 allows 2-3 mm anterolisthesis, contributes to bilateral foraminal encroachment. There is facet DJD bilaterally C5-6 and C6-7. Negative for fracture. No prevertebral soft tissue swelling. Some streak artifact from dental restorations degrades portions of the study.  IMPRESSION: 1. Negative for bleed or other acute intracranial process. 2. Negative for cervical fracture or dislocation. 3. Stable parenchymal atrophy and nonspecific white matter changes. 4. Mild anterolisthesis C4-5 may be secondary to facet disease. Flexion/extension radiographs would be required to exclude dynamic instability.   Electronically Signed   By: Oley Balm M.D.   On: 12/04/2013 08:08   Ct Cervical Spine Wo Contrast  12/04/2013   CLINICAL DATA:  FALL FALL  EXAM: CT HEAD WITHOUT CONTRAST  CT CERVICAL SPINE WITHOUT CONTRAST  TECHNIQUE: Multidetector CT imaging of the head and cervical spine was performed following the standard protocol without intravenous contrast. Multiplanar CT image reconstructions of the cervical spine were also generated.  COMPARISON:  10/12/2010  FINDINGS: CT HEAD FINDINGS  Atherosclerotic and physiologic intracranial calcifications. Fluid level in the sphenoid sinus. Stable asymmetric mineralization of basal ganglia right greater than  left. Diffuse parenchymal atrophy. Patchy areas of hypoattenuation in deep and periventricular white matter bilaterally. Negative for acute intracranial hemorrhage, mass lesion, acute infarction, midline shift, or mass-effect. Acute infarct may be inapparent on noncontrast CT. Ventricles and sulci symmetric. Bone windows demonstrate no focal lesion.  CT CERVICAL SPINE FINDINGS  Solid-appearing instrumented ACDF C3-4. Facet DJD C4-5 allows 2-3 mm anterolisthesis, contributes to bilateral foraminal encroachment. There is facet DJD bilaterally C5-6 and C6-7. Negative for fracture. No prevertebral soft tissue swelling. Some streak artifact from dental restorations degrades portions of the study.  IMPRESSION: 1. Negative for bleed or other acute intracranial process. 2. Negative for cervical fracture or dislocation. 3. Stable parenchymal atrophy and nonspecific white matter changes. 4. Mild anterolisthesis C4-5 may be secondary to facet disease. Flexion/extension radiographs would be required to exclude dynamic instability.   Electronically Signed   By: Oley Balm M.D.   On: 12/04/2013 08:08     EKG Interpretation   Date/Time:  Saturday December 04 2013 08:12:34 EDT Ventricular Rate:  63 PR Interval:  138 QRS Duration: 97 QT Interval:  455 QTC Calculation: 466 R Axis:   5 Text Interpretation:  Sinus rhythm Low voltage, extremity and precordial  leads Borderline repolarization abnormality artifact noted Confirmed by  Bebe Shaggy  MD, DONALD ([id number]) on 12/04/2013 8:15:20 AM      MDM   Final diagnoses:  Laceration of head, initial encounter  Hip injury, left, initial encounter    6:20 am- Patient assessed and orders placed. Pain medication ordered. Currently she is in NAD and hemodynamically stable.   8: 41 am Dr. Bebe Shaggy has seen patient as well. Dr. Deno Etienne with [facility] to manage hip fracture.  LACERATION REPAIR Performed by: Dorthula Matas Authorized by: Dorthula Matas Consent: Verbal consent obtained. Risks and benefits: risks, benefits and alternatives were discussed Consent given by: patient Patient identity confirmed: provided demographic data Prepped and Draped in normal sterile fashion Wound explored  Laceration Location: right temporal facial " L" shape  Laceration Length: 3 cm  No Foreign Bodies seen or palpated  Anesthesia: local infiltration  Local anesthetic: lidocaine 2% with epinephrine  Anesthetic total: 3 ml  Irrigation method: syringe Amount of cleaning: standard  Skin closure: sutures  Number of sutures: 3  Technique: simple interrupted  Patient tolerance: Patient tolerated the procedure well with no immediate complications.  Dr. Deno Etienne has agreed to consult on patient [facility]) will come today or tomorrow, feels that she will not be a surgical candidate and that xray changes are not acute.  [facility] has agreed to admit the patient, inpatient, [facility], [facility], med-surg.    Dorthula Matas, PA-C 12/04/13 [phone]

## 2013-12-04 NOTE — ED Notes (Signed)
Patient transported to CT 

## 2013-12-04 NOTE — ED Notes (Signed)
Pt alert, NAD, calm, interactive, no dyspnea noted, sitting upright in stretcher, to xray/CT. pending lab results.

## 2013-12-04 NOTE — ED Notes (Signed)
Attempted report 

## 2013-12-04 NOTE — ED Notes (Signed)
EMS gave 100 fentanyl

## 2013-12-04 NOTE — ED Provider Notes (Signed)
Patient seen/examined in the Emergency Department in conjunction with Midlevel Provider [place] Patient reports left hip pain s/p fall Exam : awake/alert, right forehead laceration noted.  Left LE shortening noted Plan: pt with complicated left hip fx.  Consult ortho and admit    EKG Interpretation  Date/Time:  Saturday December 04 2013 08:12:34 EDT Ventricular Rate:  63 PR Interval:  138 QRS Duration: 97 QT Interval:  455 QTC Calculation: 466 R Axis:   5 Text Interpretation:  Sinus rhythm Low voltage, extremity and precordial leads Borderline repolarization abnormality artifact noted Confirmed by Bebe ShaggyWICKLINE  MD, Piccola A[place] ([id number]) on 12/04/2013 8:15:20 AM         Joya Gaskinsonald W Tenasia Aull, MD 12/04/13 [phone]

## 2013-12-05 DIAGNOSIS — J440 Chronic obstructive pulmonary disease with acute lower respiratory infection: Secondary | ICD-10-CM

## 2013-12-05 DIAGNOSIS — S0190XA Unspecified open wound of unspecified part of head, initial encounter: Secondary | ICD-10-CM

## 2013-12-05 DIAGNOSIS — M25559 Pain in unspecified hip: Secondary | ICD-10-CM | POA: Diagnosis not present

## 2013-12-05 DIAGNOSIS — G8929 Other chronic pain: Secondary | ICD-10-CM

## 2013-12-05 LAB — BASIC METABOLIC PANEL
Anion gap: 13 (ref 5–15)
BUN: 17 mg/dL (ref 6–23)
CHLORIDE: 106 meq/L (ref 96–112)
CO2: 24 mEq/L (ref 19–32)
Calcium: 8.6 mg/dL (ref 8.4–10.5)
Creatinine, Ser: 0.92 mg/dL (ref 0.50–1.10)
GFR calc non Af Amer: 52 mL/min — ABNORMAL LOW (ref >90)
GFR, EST AFRICAN AMERICAN: 60 mL/min — AB (ref >90)
GLUCOSE: 77 mg/dL (ref 70–99)
POTASSIUM: 4.2 meq/L (ref 3.7–5.3)
Sodium: 143 mEq/L (ref 137–147)

## 2013-12-05 LAB — CBC
HEMATOCRIT: 36.4 % (ref 36.0–46.0)
HEMOGLOBIN: 11.6 g/dL — AB (ref 12.0–15.0)
MCH: 31.5 pg (ref 26.0–34.0)
MCHC: 31.9 g/dL (ref 30.0–36.0)
MCV: 98.9 fL (ref 78.0–100.0)
Platelets: 308 10*3/uL (ref 150–400)
RBC: 3.68 MIL/uL — ABNORMAL LOW (ref 3.87–5.11)
RDW: 14.5 % (ref 11.5–15.5)
WBC: 10.2 10*3/uL (ref 4.0–10.5)

## 2013-12-05 MED ORDER — HYDRALAZINE HCL 20 MG/ML IJ SOLN: 10.0000 mg | INTRAMUSCULAR | Status: DC | PRN

## 2013-12-05 MED ORDER — TRAMADOL HCL 50 MG PO TABS: 50.0000 mg | ORAL_TABLET | Freq: Four times a day (QID) | ORAL | Status: DC

## 2013-12-05 NOTE — Progress Notes (Signed)
Patient is medically stable for [facility]. Per RN and Child psychotherapistsocial worker at SNF patient can return. Clinical Child psychotherapistocial Worker (CSW) prepared D/C packet, faxed D/C summary and FL2 to facility, and arranged EMS for transport. Patient's son Jesusita OkaDan is aware of above. Nursing is aware of above. Please reconsult if future social work needs arise. CSW signing off.   Jetta LoutBailey Morgan, LCSWA Weekend CSW [phone]

## 2013-12-05 NOTE — Progress Notes (Signed)
Utilization review completed.  

## 2013-12-05 NOTE — Discharge Summary (Signed)
Physician Discharge Summary  Tymeshia Awan Rada ZOX:[id number] DOB: [birth year] DOA: 12/04/2013  PCP: Kimber Relic, MD  Admit date: 12/04/2013 Discharge date: 12/05/2013  Time spent: <30 minutes  Recommendations for Outpatient Follow-up:  Follow-up Information   Follow up with [facility], Claude Manges, MD In 2 weeks.   Specialty:  Orthopedic Surgery   Contact information:   [address] Sat[place] Office [place] [address] [phone]       Please follow up. (SNF MD in 1-2days.)        Discharge Diagnoses:  Principal Problem:   Pain in left hip Active Problems:   GERD (gastroesophageal reflux disease)   Dementia   Parkinson disease   Hyperlipidemia   Chronic pain   Unspecified hypothyroidism   CAD (coronary artery disease) of artery bypass graft   DNR (do not resuscitate)   HTN (hypertension)   Chronic diastolic CHF (congestive heart failure)   Edema   Fall at nursing home   Left hip pain   Discharge Condition: stable  Diet recommendation: low sodium heart healthy  Filed Weights   12/04/13 0557  Weight: 71.668 kg (158 lb)    History of present illness:  Pt is a 80 y.o. female nursing home resident, with extensive past medical history including advanced dementia, hypertension, COPD, chronic diastolic CHF, CAD, GERD, Parkinson's disease, neuropathy, hypothyroid, hyperlipidemia, anxiety and depression, chronic pain in left hip status post left hip surgical repair in 2002, chronic leg edema and DO NOT RESUSCITATE, transferred from nursing home to the ED on 12/04/13 following a fall at nursing home with associated left hip pain. Unable to obtain significant history from patient secondary to advanced dementia but she states that she fell and denies LOC. As per discussion with ED PA, patient was recently given a walker for ambulating and when she got out of bed early this morning she did not use her walker and fell hitting her head and the left hip to the floor. Patient doesn't  think that she passed out. She currently denies chest or left hip pain. As per PCPs notes, patient is wheelchair mobile. In the ED, imaging was suggestive of left hip fracture. ED has discussed with orthopedics who have reviewed imaging and feel that it is a chronic problem with progressive erosion of the femoral head and protrusion of hardware and plan nonsurgical management. Hospitalist admission requested for further management   Hospital Course:  Left hip pain, status post fall at nursing home:she is S/P ORIF in the past  -As discussed above she was admittedto medical floor for observation and evaluation.  -Orthopedics was consulted and reviewed imaging studies and indicated that this is a chronic finding without fracture and plan nonsurgical management. At baseline patient is non-ambulatory.  -Dr Roda Shutters followed up with pt today and reiterated that surgery would be extensive and a significant risk and recomends dc back to nursing home from ortho standpoint. -she was Continued on her chronic pain medications including scheduled tramadol and acetaminophen in the hospital and she is to continue them on d/c and follow up with SNF MD and Dr Alice Reichert -F/u with Dr Olena Heckle in 2weeks  2.Status post fall at nursing home, right forehead laceration and right wrist superficial skin tear: Fall secondary to unsteady gait and chronic weakness.  -Forehead laceration has been sutured and Steri-Strips applied to wrist tear. -follow up with SNF MD for suture removal -continue pain management 3. Chronic leg edema/history of chronic diastolic CHF: she received an increased dose of Lasix while  hospitalized and remains stable - No features suggestive of cellulitis.  -she is to continue her meds at outpt doses upon d/c, follow up with SNF MD. 4.Advanced dementia: Mental status at baseline.  -Continue Namenda.  5.GERD:  -Continue twice a day PPI.  6.Prior history of GI bleed.  7.Hypothyroidism: Continue  levothyroxine.  8.Neuropathy: Continue gabapentin and scheduled Tylenol.  9.Anxiety: Continue Cymbalta.  Chronic bronchitis/COPD -history of chronic cough. Stable. 10. Hypertension: pain likely contributing to elevated blood pressure -she was placed on prn hydralazine in the hospital -Continue by Bysystolic and Cozaar -SNF MD to continue pain management, further monitor BP and treat accordingly.  DO NOT RESUSCITATE: Patient had gold form and MOST form from SNF on admission.   Procedures:  NONE  Consultations:  ORTHO  Discharge Exam: Filed Vitals:   12/05/13 0623  BP: 187/77  Pulse: 64  Temp: 97.3 F (36.3 C)  Resp: 22    General: Alert and oriented X2, in NAD HEENT:bruising around R. Eye with sutures over lat aspect of R. Eyebrow. PERRL, EOMI Cardiovascular: RRR. nl S1 and S2   Respiratory: decreased BS at bases, no crackles or wheezes  Discharge Instructions You were cared for by a hospitalist during your hospital stay. If you have any questions about your discharge medications or the care you received while you were in the hospital after you are discharged, you can call the unit and asked to speak with the hospitalist on call if the hospitalist that took care of you is not available. Once you are discharged, your primary care physician will handle any further medical issues. Please note that NO REFILLS for any discharge medications will be authorized once you are discharged, as it is imperative that you return to your primary care physician (or establish a relationship with a primary care physician if you do not have one) for your aftercare needs so that they can reassess your need for medications and monitor your lab values.  Discharge Instructions   Diet - low sodium heart healthy    Complete by:  As directed      Increase activity slowly    Complete by:  As directed             Medication List         acetaminophen 325 MG tablet  Commonly known as:  TYLENOL   Take 650 mg by mouth 4 (four) times daily.     acyclovir 200 MG capsule  Commonly known as:  ZOVIRAX  Take 200 mg by mouth daily.     amoxicillin 500 MG capsule  Commonly known as:  AMOXIL  Take 4 capsules by mouth prior to dental procedure as directed by the access dental care staff     ARTIFICIAL TEAR OP  Place 1-2 drops into both eyes 4 (four) times daily.     calcium-vitamin D 500-200 MG-UNIT per tablet  Commonly known as:  OSCAL WITH D  Take 1 tablet by mouth daily.     celecoxib 200 MG capsule  Commonly known as:  CELEBREX  Take 200 mg by mouth daily.     DULoxetine 30 MG capsule  Commonly known as:  CYMBALTA  Take 30 mg by mouth 2 (two) times daily.     Fluticasone-Salmeterol 100-50 MCG/DOSE Aepb  Commonly known as:  ADVAIR  Inhale 1 puff into the lungs every 12 (twelve) hours.     furosemide 20 MG tablet  Commonly known as:  LASIX  Take 20 mg by mouth  daily.     gabapentin 100 MG capsule  Commonly known as:  NEURONTIN  Take 100 mg by mouth 3 (three) times daily.     ipratropium-albuterol 0.5-2.5 (3) MG/3ML Soln  Commonly known as:  DUONEB  Take 3 mLs by nebulization every 6 (six) hours as needed (shortness of breath).     isosorbide mononitrate 30 MG 24 hr tablet  Commonly known as:  IMDUR  Take 30 mg by mouth daily.     levothyroxine 25 MCG tablet  Commonly known as:  SYNTHROID, LEVOTHROID  Take 25 mcg by mouth daily.     losartan 50 MG tablet  Commonly known as:  COZAAR  Take 50 mg by mouth 2 (two) times daily.     NAMENDA XR 7 MG Cp24  Generic drug:  Memantine HCl ER  Take 14 mg by mouth daily.     nebivolol 10 MG tablet  Commonly known as:  BYSTOLIC  Take 1 tablet (10 mg total) by mouth daily.     omeprazole 40 MG capsule  Commonly known as:  PRILOSEC  Take 40 mg by mouth 2 (two) times daily.     potassium chloride 10 MEQ CR capsule  Commonly known as:  MICRO-K  Take 10 mEq by mouth 2 (two) times daily.     senna-docusate 8.6-50 MG  per tablet  Commonly known as:  SENOKOT S  Take 1 tablet by mouth 2 (two) times daily.     tiotropium 18 MCG inhalation capsule  Commonly known as:  SPIRIVA  Place 18 mcg into inhaler and inhale daily.     traMADol 50 MG tablet  Commonly known as:  ULTRAM  Take 1 tablet (50 mg total) by mouth every 6 (six) hours. 1 by mouth three times daily (6 am, 12pm, 6 pm, 12 midnight)       No Known Allergies     Follow-up Information   Follow up with [facility], Claude Manges, MD In 2 weeks.   Specialty:  Orthopedic Surgery   Contact information:   [address] [place] [address] [phone]        The results of significant diagnostics from this hospitalization (including imaging, microbiology, ancillary and laboratory) are listed below for reference.    Significant Diagnostic Studies: Dg Chest 1 View  12/04/2013   CLINICAL DATA:  FALL FALL  EXAM: CHEST - 1 VIEW  COMPARISON:  05/05/2012  FINDINGS: Increase in small bilateral pleural effusions. Mild cardiomegaly stable. Mild perihilar interstitial edema or infiltrates. Atheromatous aorta. Cervical fixation hardware partially seen.  IMPRESSION: 1. Mild cardiomegaly and perihilar interstitial edema/infiltrates. 2. Interval increase pleural effusions.   Electronically Signed   By: Oley Balm M.D.   On: 12/04/2013 07:30   Dg Wrist Complete Right  12/04/2013   CLINICAL DATA:  FALL  EXAM: RIGHT WRIST - COMPLETE 3+ VIEW  COMPARISON:  06/27/2010  FINDINGS: Diffuse osteopenia. Advanced degenerative change at the first [facility] articulation. Carpal rows intact. Interval healing across the previously noted distal radial fracture, with neutral angulation of the distal radial articular surface as before. Chondrocalcinosis in the TFCC.  IMPRESSION: 1. Osteopenia and degenerative change without fracture or other acute abnormality.   Electronically Signed   By: Oley Balm M.D.   On: 12/04/2013 07:32   Dg Hip Complete  Left  12/04/2013   CLINICAL DATA:  FALL FALL  EXAM: LEFT HIP - COMPLETE 2+ VIEW  COMPARISON:  10/12/2010  FINDINGS: Sliding screw and compression plate in  the proximal femur. There has been fracture/lysis of the femoral head and neck with a residual fragment of the femoral head noted in the acetabulum. There has been interval migration and erosion into supra-acetabular region by threads and tip of the sliding screw.  Components of right hip arthroplasty articulating normally, femoral stem not seen. Degenerative changes in the visualized lower lumbar spine.  IMPRESSION: 1. Fragmentation/lysis of the left femoral head and neck with migration and erosion of previously placed fixation hardware into the supra-acetabular region.   Electronically Signed   By: Oley Balm M.D.   On: 12/04/2013 07:25   Dg Femur Left  12/04/2013   CLINICAL DATA:  FALL FALL  EXAM: LEFT FEMUR - 2 VIEW  COMPARISON:  12/12/2010  FINDINGS: No acute fracture. Diffuse osteopenia. Sliding screw and compression plate in the proximal femur as before. There has been interval fragmentation/ lysis of the femoral head and neck, with superior migration of the fixation hardware with respect to the acetabulum, and erosion of the sliding screw tip into the supra-acetabular region.  IMPRESSION: 1. Interval lysis/fragmentation of femoral head and resultant migration of fixation hardware into the supra-acetabular region.   Electronically Signed   By: Oley Balm M.D.   On: 12/04/2013 07:28   Ct Head Wo Contrast  12/04/2013   CLINICAL DATA:  FALL FALL  EXAM: CT HEAD WITHOUT CONTRAST  CT CERVICAL SPINE WITHOUT CONTRAST  TECHNIQUE: Multidetector CT imaging of the head and cervical spine was performed following the standard protocol without intravenous contrast. Multiplanar CT image reconstructions of the cervical spine were also generated.  COMPARISON:  10/12/2010  FINDINGS: CT HEAD FINDINGS  Atherosclerotic and physiologic intracranial  calcifications. Fluid level in the sphenoid sinus. Stable asymmetric mineralization of basal ganglia right greater than left. Diffuse parenchymal atrophy. Patchy areas of hypoattenuation in deep and periventricular white matter bilaterally. Negative for acute intracranial hemorrhage, mass lesion, acute infarction, midline shift, or mass-effect. Acute infarct may be inapparent on noncontrast CT. Ventricles and sulci symmetric. Bone windows demonstrate no focal lesion.  CT CERVICAL SPINE FINDINGS  Solid-appearing instrumented ACDF C3-4. Facet DJD C4-5 allows 2-3 mm anterolisthesis, contributes to bilateral foraminal encroachment. There is facet DJD bilaterally C5-6 and C6-7. Negative for fracture. No prevertebral soft tissue swelling. Some streak artifact from dental restorations degrades portions of the study.  IMPRESSION: 1. Negative for bleed or other acute intracranial process. 2. Negative for cervical fracture or dislocation. 3. Stable parenchymal atrophy and nonspecific white matter changes. 4. Mild anterolisthesis C4-5 may be secondary to facet disease. Flexion/extension radiographs would be required to exclude dynamic instability.   Electronically Signed   By: Oley Balm M.D.   On: 12/04/2013 08:08   Ct Cervical Spine Wo Contrast  12/04/2013   CLINICAL DATA:  FALL FALL  EXAM: CT HEAD WITHOUT CONTRAST  CT CERVICAL SPINE WITHOUT CONTRAST  TECHNIQUE: Multidetector CT imaging of the head and cervical spine was performed following the standard protocol without intravenous contrast. Multiplanar CT image reconstructions of the cervical spine were also generated.  COMPARISON:  10/12/2010  FINDINGS: CT HEAD FINDINGS  Atherosclerotic and physiologic intracranial calcifications. Fluid level in the sphenoid sinus. Stable asymmetric mineralization of basal ganglia right greater than left. Diffuse parenchymal atrophy. Patchy areas of hypoattenuation in deep and periventricular white matter bilaterally. Negative for  acute intracranial hemorrhage, mass lesion, acute infarction, midline shift, or mass-effect. Acute infarct may be inapparent on noncontrast CT. Ventricles and sulci symmetric. Bone windows demonstrate no focal lesion.  CT CERVICAL  SPINE FINDINGS  Solid-appearing instrumented ACDF C3-4. Facet DJD C4-5 allows 2-3 mm anterolisthesis, contributes to bilateral foraminal encroachment. There is facet DJD bilaterally C5-6 and C6-7. Negative for fracture. No prevertebral soft tissue swelling. Some streak artifact from dental restorations degrades portions of the study.  IMPRESSION: 1. Negative for bleed or other acute intracranial process. 2. Negative for cervical fracture or dislocation. 3. Stable parenchymal atrophy and nonspecific white matter changes. 4. Mild anterolisthesis C4-5 may be secondary to facet disease. Flexion/extension radiographs would be required to exclude dynamic instability.   Electronically Signed   By: Oley Balmaniel  Hassell M.D.   On: 12/04/2013 08:08    Microbiology: No results found for this or any previous visit (from the past 240 hour(s)).   Labs: Basic Metabolic Panel:  Recent Labs Lab 11/29/13 12/04/13 0631 12/05/13 0416  NA 140 142 143  K 4.3 3.9 4.2  CL  --  104 106  CO2  --  25 24  GLUCOSE  --  94 77  BUN 14 16 17   CREATININE 0.9 0.96 0.92  CALCIUM  --  8.9 8.6   Liver Function Tests:  Recent Labs Lab 12/04/13 0631  AST 20  ALT 13  ALKPHOS 96  BILITOT 0.3  PROT 6.6  ALBUMIN 2.8*   No results found for this basename: LIPASE, AMYLASE,  in the last 168 hours No results found for this basename: AMMONIA,  in the last 168 hours CBC:  Recent Labs Lab 12/04/13 0631 12/05/13 0416  WBC 14.6* 10.2  NEUTROABS 11.5*  --   HGB 12.9 11.6*  HCT 38.8 36.4  MCV 96.3 98.9  PLT 331 308   Cardiac Enzymes: No results found for this basename: CKTOTAL, CKMB, CKMBINDEX, TROPONINI,  in the last 168 hours BNP: BNP (last 3 results) No results found for this basename:  PROBNP,  in the last 8760 hours CBG: No results found for this basename: GLUCAP,  in the last 168 hours     Signed:  Anjannette Gauger C  [facility] 12/05/2013, 3:53 PM

## 2013-12-05 NOTE — Consult Note (Signed)
ORTHOPAEDIC CONSULTATION  REQUESTING PHYSICIAN: Elease Etienne, MD  Chief Complaint: Left hip pain  HPI: [name] [name] is a 80 y.o. female who complains of left hip pain s/p mechanical fall while trying to get out of her bed at her nursing home. Nonambulatory at baseline.  Patient of Dr. Cleophas Dunker who fixed her left hip fx a few years ago.  The postop course complicated by progressive erosion of the femoral head and protrusion of the hardware.  During the patient's last visit with Dr. Cleophas Dunker, they decided not to have surgery for the problem as it would be extensive and a significant risk to her.  Ortho consulted for left hip pain.  Past Medical History  Diagnosis Date  . Hypertension   . COPD (chronic obstructive pulmonary disease)   . CHF (congestive heart failure)   . Coronary artery disease   . Renal disorder   . GERD (gastroesophageal reflux disease)   . Osteoporosis   . Syncope   . Dementia   . Anemia   . Parkinson disease   . Neuropathy   . Thyroid disease   . Hyperlipidemia   . Anxiety   . Depression   . Chronic pain 11/01/2011   Past Surgical History  Procedure Laterality Date  . Fracture surgery    . Esophagogastroduodenoscopy  05/05/2012    Procedure: ESOPHAGOGASTRODUODENOSCOPY (EGD);  Surgeon: [name], MD;  Location: [facility] ENDOSCOPY;  Service: Endoscopy;  Laterality: N/A;  Pediatric upper endoscope   History   Social History  . Marital Status: Widowed    Spouse Name: N/A    Number of Children: N/A  . Years of Education: N/A   Social History Main Topics  . Smoking status: Never Smoker   . Smokeless tobacco: None  . Alcohol Use: No  . Drug Use: No  . Sexual Activity: No   Other Topics Concern  . None   Social History Narrative  . None   No family history on file. No Known Allergies Prior to Admission medications   Medication Sig Start Date End Date Taking? Authorizing Provider  acetaminophen (TYLENOL) 325 MG tablet Take 650 mg by  mouth 4 (four) times daily.    Yes Historical Provider, MD  acyclovir (ZOVIRAX) 200 MG capsule Take 200 mg by mouth daily.   Yes Historical Provider, MD  amoxicillin (AMOXIL) 500 MG capsule Take 4 capsules by mouth prior to dental procedure as directed by the access dental care staff   Yes Historical Provider, MD  ARTIFICIAL TEAR OP Place 1-2 drops into both eyes 4 (four) times daily.   Yes Historical Provider, MD  calcium-vitamin D (OSCAL WITH D) 500-200 MG-UNIT per tablet Take 1 tablet by mouth daily.   Yes Historical Provider, MD  celecoxib (CELEBREX) 200 MG capsule Take 200 mg by mouth daily.   Yes Historical Provider, MD  DULoxetine (CYMBALTA) 30 MG capsule Take 30 mg by mouth 2 (two) times daily.   Yes Historical Provider, MD  Fluticasone-Salmeterol (ADVAIR) 100-50 MCG/DOSE AEPB Inhale 1 puff into the lungs every 12 (twelve) hours.   Yes Historical Provider, MD  furosemide (LASIX) 20 MG tablet Take 20 mg by mouth daily.   Yes Historical Provider, MD  gabapentin (NEURONTIN) 100 MG capsule Take 100 mg by mouth 3 (three) times daily.   Yes Historical Provider, MD  ipratropium-albuterol (DUONEB) 0.5-2.5 (3) MG/3ML SOLN Take 3 mLs by nebulization every 6 (six) hours as needed (shortness of breath).   Yes Historical Provider, MD  isosorbide mononitrate (IMDUR) 30 MG 24 hr tablet Take 30 mg by mouth daily.   Yes Historical Provider, MD  levothyroxine (SYNTHROID, LEVOTHROID) 25 MCG tablet Take 25 mcg by mouth daily.    Yes Historical Provider, MD  losartan (COZAAR) 50 MG tablet Take 50 mg by mouth 2 (two) times daily.   Yes Historical Provider, MD  Memantine HCl ER (NAMENDA XR) 7 MG CP24 Take 14 mg by mouth daily.   Yes Historical Provider, MD  nebivolol (BYSTOLIC) 10 MG tablet Take 1 tablet (10 mg total) by mouth daily. 09/28/13  Yes Man Mast X, NP  omeprazole (PRILOSEC) 40 MG capsule Take 40 mg by mouth 2 (two) times daily.   Yes Historical Provider, MD  potassium chloride (MICRO-K) 10 MEQ CR capsule  Take 10 mEq by mouth 2 (two) times daily.   Yes Historical Provider, MD  senna-docusate (SENOKOT S) 8.6-50 MG per tablet Take 1 tablet by mouth 2 (two) times daily. 10/19/13  Yes Man Mast X, NP  tiotropium (SPIRIVA) 18 MCG inhalation capsule Place 18 mcg into inhaler and inhale daily.   Yes Historical Provider, MD  traMADol (ULTRAM) 50 MG tablet Take 50 mg by mouth every 6 (six) hours. 1 by mouth three times daily (6 am, 12pm, 6 pm, 12 midnight) 04/05/13  Yes Kermit Balo, DO   Dg Chest 1 View  12/04/2013   CLINICAL DATA:  FALL FALL  EXAM: CHEST - 1 VIEW  COMPARISON:  05/05/2012  FINDINGS: Increase in small bilateral pleural effusions. Mild cardiomegaly stable. Mild perihilar interstitial edema or infiltrates. Atheromatous aorta. Cervical fixation hardware partially seen.  IMPRESSION: 1. Mild cardiomegaly and perihilar interstitial edema/infiltrates. 2. Interval increase pleural effusions.   Electronically Signed   By: Oley Balm M.D.   On: 12/04/2013 07:30   Dg Wrist Complete Right  12/04/2013   CLINICAL DATA:  FALL  EXAM: RIGHT WRIST - COMPLETE 3+ VIEW  COMPARISON:  06/27/2010  FINDINGS: Diffuse osteopenia. Advanced degenerative change at the first [facility] articulation. Carpal rows intact. Interval healing across the previously noted distal radial fracture, with neutral angulation of the distal radial articular surface as before. Chondrocalcinosis in the TFCC.  IMPRESSION: 1. Osteopenia and degenerative change without fracture or other acute abnormality.   Electronically Signed   By: Oley Balm M.D.   On: 12/04/2013 07:32   Dg Hip Complete Left  12/04/2013   CLINICAL DATA:  FALL FALL  EXAM: LEFT HIP - COMPLETE 2+ VIEW  COMPARISON:  10/12/2010  FINDINGS: Sliding screw and compression plate in the proximal femur. There has been fracture/lysis of the femoral head and neck with a residual fragment of the femoral head noted in the acetabulum. There has been interval migration and erosion into  supra-acetabular region by threads and tip of the sliding screw.  Components of right hip arthroplasty articulating normally, femoral stem not seen. Degenerative changes in the visualized lower lumbar spine.  IMPRESSION: 1. Fragmentation/lysis of the left femoral head and neck with migration and erosion of previously placed fixation hardware into the supra-acetabular region.   Electronically Signed   By: Oley Balm M.D.   On: 12/04/2013 07:25   Dg Femur Left  12/04/2013   CLINICAL DATA:  FALL FALL  EXAM: LEFT FEMUR - 2 VIEW  COMPARISON:  12/12/2010  FINDINGS: No acute fracture. Diffuse osteopenia. Sliding screw and compression plate in the proximal femur as before. There has been interval fragmentation/ lysis of the femoral head and neck, with superior migration of  the fixation hardware with respect to the acetabulum, and erosion of the sliding screw tip into the supra-acetabular region.  IMPRESSION: 1. Interval lysis/fragmentation of femoral head and resultant migration of fixation hardware into the supra-acetabular region.   Electronically Signed   By: Oley Balm M.D.   On: 12/04/2013 07:28   Ct Head Wo Contrast  12/04/2013   CLINICAL DATA:  FALL FALL  EXAM: CT HEAD WITHOUT CONTRAST  CT CERVICAL SPINE WITHOUT CONTRAST  TECHNIQUE: Multidetector CT imaging of the head and cervical spine was performed following the standard protocol without intravenous contrast. Multiplanar CT image reconstructions of the cervical spine were also generated.  COMPARISON:  10/12/2010  FINDINGS: CT HEAD FINDINGS  Atherosclerotic and physiologic intracranial calcifications. Fluid level in the sphenoid sinus. Stable asymmetric mineralization of basal ganglia right greater than left. Diffuse parenchymal atrophy. Patchy areas of hypoattenuation in deep and periventricular white matter bilaterally. Negative for acute intracranial hemorrhage, mass lesion, acute infarction, midline shift, or mass-effect. Acute infarct may be  inapparent on noncontrast CT. Ventricles and sulci symmetric. Bone windows demonstrate no focal lesion.  CT CERVICAL SPINE FINDINGS  Solid-appearing instrumented ACDF C3-4. Facet DJD C4-5 allows 2-3 mm anterolisthesis, contributes to bilateral foraminal encroachment. There is facet DJD bilaterally C5-6 and C6-7. Negative for fracture. No prevertebral soft tissue swelling. Some streak artifact from dental restorations degrades portions of the study.  IMPRESSION: 1. Negative for bleed or other acute intracranial process. 2. Negative for cervical fracture or dislocation. 3. Stable parenchymal atrophy and nonspecific white matter changes. 4. Mild anterolisthesis C4-5 may be secondary to facet disease. Flexion/extension radiographs would be required to exclude dynamic instability.   Electronically Signed   By: Oley Balm M.D.   On: 12/04/2013 08:08   Ct Cervical Spine Wo Contrast  12/04/2013   CLINICAL DATA:  FALL FALL  EXAM: CT HEAD WITHOUT CONTRAST  CT CERVICAL SPINE WITHOUT CONTRAST  TECHNIQUE: Multidetector CT imaging of the head and cervical spine was performed following the standard protocol without intravenous contrast. Multiplanar CT image reconstructions of the cervical spine were also generated.  COMPARISON:  10/12/2010  FINDINGS: CT HEAD FINDINGS  Atherosclerotic and physiologic intracranial calcifications. Fluid level in the sphenoid sinus. Stable asymmetric mineralization of basal ganglia right greater than left. Diffuse parenchymal atrophy. Patchy areas of hypoattenuation in deep and periventricular white matter bilaterally. Negative for acute intracranial hemorrhage, mass lesion, acute infarction, midline shift, or mass-effect. Acute infarct may be inapparent on noncontrast CT. Ventricles and sulci symmetric. Bone windows demonstrate no focal lesion.  CT CERVICAL SPINE FINDINGS  Solid-appearing instrumented ACDF C3-4. Facet DJD C4-5 allows 2-3 mm anterolisthesis, contributes to bilateral foraminal  encroachment. There is facet DJD bilaterally C5-6 and C6-7. Negative for fracture. No prevertebral soft tissue swelling. Some streak artifact from dental restorations degrades portions of the study.  IMPRESSION: 1. Negative for bleed or other acute intracranial process. 2. Negative for cervical fracture or dislocation. 3. Stable parenchymal atrophy and nonspecific white matter changes. 4. Mild anterolisthesis C4-5 may be secondary to facet disease. Flexion/extension radiographs would be required to exclude dynamic instability.   Electronically Signed   By: Oley Balm M.D.   On: 12/04/2013 08:08    Positive ROS: All other systems have been reviewed and were otherwise negative with the exception of those mentioned in the HPI and as above.  Physical Exam: General: Alert, no acute distress Cardiovascular: No pedal edema Respiratory: No cyanosis, no use of accessory musculature GI: No organomegaly, abdomen is soft and  non-tender Skin: No lesions in the area of chief complaint Neurologic: Sensation intact distally Psychiatric: Patient is competent for consent with normal mood and affect Lymphatic: No axillary or cervical lymphadenopathy  MUSCULOSKELETAL:  - moderate pain with attempted hip ROM - LLE NVI - scar well healed - foot wwp, nvi  Assessment: Left hip pain s/p ORIF  Plan: - xrays reviewed and are c/w chronic erosion of the proximal femur and protrusion of hardware into acetabulum - discussed finding with son over phone yesterday, we agreed to pursue nonop, symptomatic treatment - reiterated that surgery would be extensive and a significant risk - patient is nonambulatory at baseline - recommend pain control - may dc back to nursing home from ortho standpoint - f/u Dr. Cleophas DunkerWhitfield 2 weeks  Thank you for the consult and the opportunity to see Ms. Milich  N. Glee ArvinMichael Julias Mould, MD [facility] [phone] 7:11 AM

## 2013-12-05 NOTE — Progress Notes (Addendum)
Clinical Social Work Department BRIEF PSYCHOSOCIAL ASSESSMENT 12/05/2013  Patient:  Randolm IdolFIELDS,Makaylen O     Account Number:  [id number]     Admit date:  12/04/2013  Clinical Social Worker:  Hendricks MiloMORGAN,Marvis Saefong, LCSWA  Date/Time:  12/05/2013 05:59 PM  Referred by:  Physician  Date Referred:  12/05/2013 Referred for  ALF Placement   Other Referral:   Interview type:  Family Other interview type:    PSYCHOSOCIAL DATA Living Status:  FACILITY Admitted from facility[facility]  [facility] Level of care:  Skilled Nursing Primary support name:  Jamey ReasDan Seufert Primary support relationship to patient:  CHILD, ADULT Degree of support available:   Good support.    CURRENT CONCERNS  Other Concerns:    SOCIAL WORK ASSESSMENT / PLAN Per RN patient is not alert and oriented. Clinical Child psychotherapistocial Worker (CSW) contacted patient's son Jesusita OkaDan. [facility] and he would like her to return. Dan reported that [facility].   Assessment/plan status:  Psychosocial Support/Ongoing Assessment of Needs Other assessment/ plan:   Information/referral to community resources:    PATIENT'S/FAMILY'S RESPONSE TO PLAN OF CARE: Patient's son Jesusita OkaDan agreeable for [facility] to [facility].

## 2013-12-07 ENCOUNTER — Encounter: Payer: Self-pay | Admitting: Nurse Practitioner

## 2013-12-07 ENCOUNTER — Non-Acute Institutional Stay (SKILLED_NURSING_FACILITY): Payer: Medicare Other | Admitting: Nurse Practitioner

## 2013-12-07 DIAGNOSIS — S0180XA Unspecified open wound of other part of head, initial encounter: Secondary | ICD-10-CM

## 2013-12-07 DIAGNOSIS — K219 Gastro-esophageal reflux disease without esophagitis: Secondary | ICD-10-CM

## 2013-12-07 DIAGNOSIS — G589 Mononeuropathy, unspecified: Secondary | ICD-10-CM

## 2013-12-07 DIAGNOSIS — S0181XA Laceration without foreign body of other part of head, initial encounter: Secondary | ICD-10-CM | POA: Insufficient documentation

## 2013-12-07 DIAGNOSIS — F32A Depression, unspecified: Secondary | ICD-10-CM

## 2013-12-07 DIAGNOSIS — M25552 Pain in left hip: Secondary | ICD-10-CM

## 2013-12-07 DIAGNOSIS — F0390 Unspecified dementia without behavioral disturbance: Secondary | ICD-10-CM

## 2013-12-07 DIAGNOSIS — I509 Heart failure, unspecified: Secondary | ICD-10-CM

## 2013-12-07 DIAGNOSIS — I10 Essential (primary) hypertension: Secondary | ICD-10-CM

## 2013-12-07 DIAGNOSIS — M25559 Pain in unspecified hip: Secondary | ICD-10-CM

## 2013-12-07 DIAGNOSIS — E039 Hypothyroidism, unspecified: Secondary | ICD-10-CM

## 2013-12-07 DIAGNOSIS — G629 Polyneuropathy, unspecified: Secondary | ICD-10-CM

## 2013-12-07 DIAGNOSIS — F329 Major depressive disorder, single episode, unspecified: Secondary | ICD-10-CM

## 2013-12-07 DIAGNOSIS — S0181XD Laceration without foreign body of other part of head, subsequent encounter: Secondary | ICD-10-CM

## 2013-12-07 DIAGNOSIS — I5032 Chronic diastolic (congestive) heart failure: Secondary | ICD-10-CM

## 2013-12-07 DIAGNOSIS — F3289 Other specified depressive episodes: Secondary | ICD-10-CM

## 2013-12-07 DIAGNOSIS — J42 Unspecified chronic bronchitis: Secondary | ICD-10-CM

## 2013-12-07 DIAGNOSIS — Z5189 Encounter for other specified aftercare: Secondary | ICD-10-CM

## 2013-12-07 NOTE — Assessment & Plan Note (Signed)
takes Cymbalta 30mg bid-stable.    

## 2013-12-07 NOTE — Assessment & Plan Note (Signed)
takes Levothyroxine 25mcg daily, TSH 2.741 05/20/13. 2.772 10/21/13 

## 2013-12-07 NOTE — Assessment & Plan Note (Signed)
Hx GI bleed, stable, Hgb11.9 07/14/11-11.6 09/29/39, off  Carafate 12/14/12 and  continue Omeprazole 40mg bid.   

## 2013-12-07 NOTE — Assessment & Plan Note (Signed)
Status post fall at nursing home, right forehead laceration and right wrist superficial skin tear: Fall secondary to unsteady gait and chronic weakness.  -Forehead laceration has been sutured and Steri-Strips applied to wrist tear.  -follow up with SNF MD for suture removal

## 2013-12-07 NOTE — Assessment & Plan Note (Signed)
chronic since the left hip fx surgical repair 06/2010-pain is managed with Tramadol 50mg qid,Tylenol 650mg qid, Celebrex 200mg daily. W/c for mobility when out of bed. X-ray 01/13/13 showed no new acute fracture, subluxation, or dislocation. Also showed old healed fracture deformity at the left femoral head, neck, and intertrochanteric region with orthopedic rod, plate and screws spanning form superolateral to the femoral head to the proximal shaft. Potion of the orthopedic screw appear outside the confines of the femoral head. s/p Orthopedic consultation.    

## 2013-12-07 NOTE — Assessment & Plan Note (Signed)
Gradual decline, takes Namenda and lives in SNF.  

## 2013-12-07 NOTE — Assessment & Plan Note (Signed)
Compensated clinically except persisted/slightly worse BLE edema and basilar rales. Takes Furosemide 20mg  daily.

## 2013-12-07 NOTE — Assessment & Plan Note (Signed)
Chronic cough  better since Furosemide. Continue Spiriva and Advair.    

## 2013-12-07 NOTE — Progress Notes (Signed)
Patient ID: Randolm Idol, female   DOB: 1917-07-09, 80 y.o.   MRN: [id number]   Code Status: DNR  No Known Allergies  Chief Complaint  Patient presents with  . Medical Management of Chronic Issues  . Acute Visit  . Hospitalization Follow-up    s/p falling and multiple skin tears    HPI: Patient is a 80 y.o. female seen in the SNF at [facility] today for evaluation of s/p fall, multiple skin tears,  and chronic medical conditions.     Hospitalized 12/04/2013 -12/05/2013. 12/04/13 presented to ED following a fall at nursing home with associated left hip pain. Unable to obtain significant history from patient secondary to advanced dementia but she states that she fell and denies LOC. The patient was recently given a walker for ambulating and when she got out of bed early.he did not use her walker and fell hitting her head and the left hip to the floor. Orthopedics who have reviewed imaging and feel that it is a chronic problem with progressive erosion of the femoral head and protrusion of hardware and plan nonsurgical management.   Problem List Items Addressed This Visit   Unspecified hypothyroidism (Chronic)     takes Levothyroxine daily, TSH 2.741 05/20/13. 2.772 10/21/13      GERD (gastroesophageal reflux disease)     Hx GI bleed, stable, Hgb11.9 07/14/11-11.6 09/29/39, off  Carafate 12/14/12 and  continue Omeprazole 40mg  bid.       Dementia     Gradual decline, takes Namenda and lives in [place].       Neuropathy     Stable on Gabapentin 100mg  tid and Tylenol 650mg  qid      Depression     takes Cymbalta 30mg  bid-stable.        Unspecified chronic bronchitis     Chronic cough  better since Furosemide. Continue Spiriva and Advair.       HTN (hypertension)     Elevated Sbp, continue Bystolic 10mg  and Losartan 50mg  bid.        Pain in left hip - Primary     chronic since the left hip fx surgical repair 06/2010-pain is managed with Tramadol 50mg  qid,Tylenol 650mg   qid, Celebrex 200mg  daily. W/c for mobility when out of bed. X-ray 01/13/13 showed no new acute fracture, subluxation, or dislocation. Also showed old healed fracture deformity at the left femoral head, neck, and intertrochanteric region with orthopedic rod, plate and screws spanning form superolateral to the femoral head to the proximal shaft. Potion of the orthopedic screw appear outside the confines of the femoral head. s/p Orthopedic consultation.        Chronic diastolic CHF (congestive heart failure)     Compensated clinically except persisted/slightly worse BLE edema and basilar rales. Takes Furosemide 20mg  daily.     Laceration of right side of forehead with complication      Status post fall at nursing home, right forehead laceration and right wrist superficial skin tear: Fall secondary to unsteady gait and chronic weakness.  -Forehead laceration has been sutured and Steri-Strips applied to wrist tear.  -follow up with SNF MD for suture removal         Review of Systems:  Review of Systems  Constitutional: Negative for fever, chills, weight loss, malaise/fatigue and diaphoresis.  HENT: Positive for hearing loss. Negative for congestion, ear pain and sore throat.   Eyes: Negative for pain, discharge and redness.  Respiratory: Positive for cough. Negative for sputum production, shortness  of breath and wheezing.        Chronic cough.   Cardiovascular: Positive for leg swelling (trace in ankle, chronic, venous insufficiency). Negative for chest pain, orthopnea, claudication and PND.       Reported chest pain x1 06/30/13. 1+edema BLE  Gastrointestinal: Negative for heartburn, nausea, vomiting, abdominal pain, diarrhea, constipation and blood in stool.          Genitourinary: Positive for frequency. Negative for dysuria, urgency and flank pain.  Musculoskeletal: Positive for back pain, joint pain (left hip pain. ), myalgias and neck pain. Negative for falls.  Skin: Negative for itching  and rash.       BLE chronic venous insufficiency with mild erythema lower 1/3 of the BLE. Lipo dermatosclerosis. R eyebrow sutures intact. Left wrist bruise. Multiple skin tears R arm/hand.   Neurological: Negative for dizziness, tingling, tremors, speech change, focal weakness, seizures, loss of consciousness and weakness. Sensory change: periphearl neuropathy.  Endo/Heme/Allergies: Negative for environmental allergies and polydipsia. Does not bruise/bleed easily.  Psychiatric/Behavioral: Positive for memory loss. Negative for depression and hallucinations. The patient is not nervous/anxious and does not have insomnia.      Past Medical History  Diagnosis Date  . Hypertension   . COPD (chronic obstructive pulmonary disease)   . CHF (congestive heart failure)   . Coronary artery disease   . Renal disorder   . GERD (gastroesophageal reflux disease)   . Osteoporosis   . Syncope   . Dementia   . Anemia   . Parkinson disease   . Neuropathy   . Thyroid disease   . Hyperlipidemia   . Anxiety   . Depression   . Chronic pain 11/01/2011   Past Surgical History  Procedure Laterality Date  . Fracture surgery    . Esophagogastroduodenoscopy  05/05/2012    Procedure: ESOPHAGOGASTRODUODENOSCOPY (EGD);  Surgeon: [name], MD;  Location: [facility] ENDOSCOPY;  Service: Endoscopy;  Laterality: N/A;  Pediatric upper endoscope   Social History:   reports that she has never smoked. She does not have any smokeless tobacco history on file. She reports that she does not drink alcohol or use illicit drugs.  Medications: Patient's Medications  New Prescriptions   No medications on file  Previous Medications   ACETAMINOPHEN (TYLENOL) 325 MG TABLET    Take 650 mg by mouth 4 (four) times daily.    ACYCLOVIR (ZOVIRAX) 200 MG CAPSULE    Take 200 mg by mouth daily.   AMOXICILLIN (AMOXIL) 500 MG CAPSULE    Take 4 capsules by mouth prior to dental procedure as directed by the access dental care staff    ARTIFICIAL TEAR OP    Place 1-2 drops into both eyes 4 (four) times daily.   CALCIUM-VITAMIN D (OSCAL WITH D) 500-200 MG-UNIT PER TABLET    Take 1 tablet by mouth daily.   CELECOXIB (CELEBREX) 200 MG CAPSULE    Take 200 mg by mouth daily.   DULOXETINE (CYMBALTA) 30 MG CAPSULE    Take 30 mg by mouth 2 (two) times daily.   FLUTICASONE-SALMETEROL (ADVAIR) 100-50 MCG/DOSE AEPB    Inhale 1 puff into the lungs every 12 (twelve) hours.   FUROSEMIDE (LASIX) 20 MG TABLET    Take 20 mg by mouth daily.   GABAPENTIN (NEURONTIN) 100 MG CAPSULE    Take 100 mg by mouth 3 (three) times daily.   IPRATROPIUM-ALBUTEROL (DUONEB) 0.5-2.5 (3) MG/3ML SOLN    Take 3 mLs by nebulization every 6 (six) hours as  needed (shortness of breath).   ISOSORBIDE MONONITRATE (IMDUR) 30 MG 24 HR TABLET    Take 30 mg by mouth daily.   LEVOTHYROXINE (SYNTHROID, LEVOTHROID) 25 MCG TABLET    Take 25 mcg by mouth daily.    LOSARTAN (COZAAR) 50 MG TABLET    Take 50 mg by mouth 2 (two) times daily.   MEMANTINE HCL ER (NAMENDA XR) 7 MG CP24    Take 14 mg by mouth daily.   NEBIVOLOL (BYSTOLIC) 10 MG TABLET    Take 1 tablet (10 mg total) by mouth daily.   OMEPRAZOLE (PRILOSEC) 40 MG CAPSULE    Take 40 mg by mouth 2 (two) times daily.   POTASSIUM CHLORIDE (MICRO-K) 10 MEQ CR CAPSULE    Take 10 mEq by mouth 2 (two) times daily.   SENNA-DOCUSATE (SENOKOT S) 8.6-50 MG PER TABLET    Take 1 tablet by mouth 2 (two) times daily.   TIOTROPIUM (SPIRIVA) 18 MCG INHALATION CAPSULE    Place 18 mcg into inhaler and inhale daily.   TRAMADOL (ULTRAM) 50 MG TABLET    Take 1 tablet (50 mg total) by mouth every 6 (six) hours. 1 by mouth three times daily (6 am, 12pm, 6 pm, 12 midnight)  Modified Medications   No medications on file  Discontinued Medications   No medications on file     Physical Exam: Physical Exam  Constitutional: She is oriented to person, place, and time. She appears well-developed and well-nourished.  HENT:  Head: Normocephalic and  atraumatic.  Eyes: Conjunctivae and EOM are normal. Pupils are equal, round, and reactive to light.  Neck: Normal range of motion. No JVD present. No thyromegaly present.  Chronic decreased lateral ROM of the neck   Cardiovascular: Regular rhythm and normal heart sounds.   No murmur heard. HR 50-60s  Pulmonary/Chest: Effort normal. She has no wheezes. She has no rales.  Abdominal: Soft. Bowel sounds are normal.  Musculoskeletal: She exhibits edema and tenderness.  Mainly pain in neck and left hip. 1+ edema BLE. Lipo dermatosclerosis BLE from knee down.   Lymphadenopathy:    She has no cervical adenopathy.  Neurological: She is alert and oriented to person, place, and time. She has normal reflexes. No cranial nerve deficit. She exhibits normal muscle tone. Coordination normal.  Skin: Skin is warm and dry. No lesion and no rash noted. No erythema.  New skin lesion at bridge of her nose, new, scaly, superficial, non healing--11/17/12 Dermatology consultation: Bowen's disease-R leg. 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 BLE chronic venous insufficiency with mild erythema lower 1/3 of the BLE.  BLE chronic venous insufficiency with mild erythema lower 1/3 of the BLE. Lipo dermatosclerosis. R eyebrow sutures intact. Left wrist bruise. Multiple skin tears R arm/hand.    Psychiatric: Cognition and memory are impaired. She exhibits abnormal recent memory.    Filed Vitals:   12/07/13 1455  BP: 117/63  Pulse: 66  Temp: 97.9 F (36.6 C)  TempSrc: Tympanic  Resp: 16      Labs reviewed: Basic Metabolic Panel:  Recent Labs  21/30/8610/16/14  05/20/13  10/21/13 11/29/13 12/04/13 0631 12/05/13 0416  NA  --   < >  --   < >  --  140 142 143  K  --   < >  --   < >  --  4.3 3.9 4.2  CL  --   --   --   --   --   --  104 106  CO2  --   --   --   --   --   --  25 24  GLUCOSE  --   --   --   --   --   --  94 77  BUN  --   < >  --   < >  --  14 16 17     CREATININE  --   < >  --   < >  --  0.9 0.96 0.92  CALCIUM  --   --   --   --   --   --  8.9 8.6  TSH 5.47  --  2.74  --  2.77  --   --   --   < > = values in this interval not displayed. CBC:  Recent Labs  06/12/13 12/04/13 0631 12/05/13 0416  WBC 6.8 14.6* 10.2  NEUTROABS  --  11.5*  --   HGB 14.0 12.9 11.6*  HCT 42 38.8 36.4  MCV  --  96.3 98.9  PLT 271 331 308    Past procedures:  06/07/13 CXR borderline cardiomegaly unchanged with new minimal pulmonary vascular congestion, patchy bibasilar atelectasis or pneumonitis appears new.   12/04/13 CT head and cervical spine w/o contrast:  IMPRESSION:  1. Negative for bleed or other acute intracranial process.  2. Negative for cervical fracture or dislocation.  3. Stable parenchymal atrophy and nonspecific white matter changes.  4. Mild anterolisthesis C4-5 may be secondary to facet disease.  Flexion/extension radiographs would be required to exclude dynamic  instability.  12/04/13 X-ray R wrist:  IMPRESSION: 1. Osteopenia and degenerative change without fracture or other acute abnormality.   12/04/13 X-ray left femur:  IMPRESSION: 1. Fragmentation/lysis of the left femoral head and neck with migration and erosion of previously placed fixation hardware into the supra-acetabular region.  12/04/13 X-ray L hip  IMPRESSION: 1. Interval lysis/fragmentation of femoral head and resultant migration of fixation hardware into the supra-acetabular region.   12/04/13 CXR  IMPRESSION: 1. Mild cardiomegaly and perihilar interstitial edema/infiltrates. 2. Interval increase pleural effusions.  Assessment/Plan Problem List Items Addressed This Visit   Unspecified hypothyroidism (Chronic)     takes Levothyroxine daily, TSH 2.741 05/20/13. Update TSH      Unspecified constipation     10/18/13 Senokot S bid. Observe.     Unspecified chronic bronchitis     Chronic cough. Recent developing CHF aggravated her chronic cough. It  should be better since Furosemide.      Pain in left hip     chronic since the left hip fx surgical repair 06/2010-pain is managed with Tramadol 50mg  qid,Tylenol 650mg  qid, Celebrex 200mg  daily. W/c for mobility when out of bed. X-ray 01/13/13 showed no new acute fracture, subluxation, or dislocation. Also showed old healed fracture deformity at the left femoral head, neck, and intertrochanteric region with orthopedic rod, plate and screws spanning form superolateral to the femoral head to the proximal shaft. Potion of the orthopedic screw appear outside the confines of the femoral head. s/p Orthopedic consultation.         Neuropathy     Stable on Gabapentin 100mg  tid and Tylenol 650mg  qid       Nausea with vomiting - Primary     Resolved.     HTN (hypertension)     Elevated Sbp, denied chest pain, headaches, vision changes, or dizziness. Continue Bystolic 10mg  and Losartan 50mg  bid. Added Furosemide should help blood pressure. Continue  to observe.      GERD (gastroesophageal reflux disease)     Hx GI bleed, stable, Hgb11.9 07/14/11-11.6 09/29/39, off  Carafate 12/14/12 and  continue Omeprazole 40mg  bid.     Dementia     Gradual decline, takes Namenda and lives in [place].         CHF (congestive heart failure)     Improved O2 desaturation, cough, BLE edema. CXR 10/14/13 mild to moderate congestive heart failure. 10/18/13 BNP 502.8. Continue Furosemide 10mg  Kcl daily. Obtain Echocardiogram to evaluate further.      CAD (coronary artery disease) of artery bypass graft     Stable on Imdur 30mg . Last chest pain 06/30/13 relived with NTG and MaaLox.       Anxiety     takes Cymbalta 30mg  bid-stable.           Family/ Staff Communication: observe the patient.   Goals of Care: SNF  Labs/tests ordered: UA C/S pending.

## 2013-12-07 NOTE — Assessment & Plan Note (Signed)
Elevated Sbp, continue Bystolic 10mg and Losartan 50mg bid.   

## 2013-12-07 NOTE — Assessment & Plan Note (Signed)
Stable on Imdur 30mg. Last chest pain 06/30/13 relived with NTG and MaaLox.    

## 2013-12-07 NOTE — Assessment & Plan Note (Signed)
Stable on Gabapentin 100mg tid and Tylenol 650mg qid  

## 2013-12-09 ENCOUNTER — Non-Acute Institutional Stay (SKILLED_NURSING_FACILITY): Payer: Medicare Other | Admitting: Internal Medicine

## 2013-12-09 ENCOUNTER — Encounter: Payer: Self-pay | Admitting: Internal Medicine

## 2013-12-09 DIAGNOSIS — W19XXXA Unspecified fall, initial encounter: Secondary | ICD-10-CM

## 2013-12-09 DIAGNOSIS — I5032 Chronic diastolic (congestive) heart failure: Secondary | ICD-10-CM

## 2013-12-09 DIAGNOSIS — I10 Essential (primary) hypertension: Secondary | ICD-10-CM

## 2013-12-09 DIAGNOSIS — I509 Heart failure, unspecified: Secondary | ICD-10-CM

## 2013-12-09 DIAGNOSIS — W19XXXD Unspecified fall, subsequent encounter: Secondary | ICD-10-CM

## 2013-12-09 DIAGNOSIS — M25552 Pain in left hip: Secondary | ICD-10-CM

## 2013-12-09 DIAGNOSIS — Y92129 Unspecified place in nursing home as the place of occurrence of the external cause: Principal | ICD-10-CM

## 2013-12-09 DIAGNOSIS — F0390 Unspecified dementia without behavioral disturbance: Secondary | ICD-10-CM

## 2013-12-09 DIAGNOSIS — M25559 Pain in unspecified hip: Secondary | ICD-10-CM

## 2013-12-09 DIAGNOSIS — Z5189 Encounter for other specified aftercare: Secondary | ICD-10-CM

## 2013-12-09 NOTE — Progress Notes (Signed)
Patient ID: [name] [name], female   DOB: 05-10-1918, 80 y.o.   MRN: [id number]    Location:  [facility] Service: SNF [phone])  Extended Emergency Contact Information Primary Emergency Contact: [facility] Address: [place], [place] [address] [name] of [place] Phone: [phone] Mobile Phone: [phone] Relation: Son Secondary Emergency Contact: [name] Address: [address]          [place], [place] [address] [name] of [place] Phone: [phone] Relation: Son   Chief Complaint  Patient presents with  . Hospitalization Follow-up    HPI:  Readmitted to skilled nursing facility 12/05/13. Hospitalized 12/04/13 12/05/13. Patient had a fall and complained of severe left hip pain afterwards. She sustained a laceration requiring stitches in the right eyebrow area. There were multiple abrasions.  Following evaluation, left hip pain, which has been chronic with an acute overlay, was determined to be related to erosions around the previous prosthesis. There was no acute fracture. The orthopedists proposed conservative treatment.  Other problems noted during this admission included: Hypertension, COPD, coronary artery disease, parkinsonism, hypothyroidism, hyperlipidemia, and dementia. She has chronic diastolic congestive heart failure. There was an acute worsening of the heart failure in June 2015, however she seems compensated at this point.  Past Medical History  Diagnosis Date  . Hypertension   . COPD (chronic obstructive pulmonary disease)   . Chronic diastolic CHF (congestive heart failure)   . Coronary artery disease   . Renal disorder   . GERD (gastroesophageal reflux disease)   . Osteoporosis   . Syncope   . Dementia   . Anemia   . Parkinson disease   . Neuropathy   . Thyroid disease   . Hyperlipidemia   . Anxiety   . Depression   . Chronic pain 11/01/2011    Left hip with degeneration at the site of  previous prosthesis  . UTI (lower urinary tract infection) 02/07/2013  . Unspecified hypothyroidism 08/25/2012    10/21/13 TSH 2.772   . Unspecified constipation 10/19/2013    10/18/13 Senokot S bid.    Marland Kitchen Unspecified chronic bronchitis 10/16/2012  . Herpes genitalis in women 10/16/2012    Past Surgical History  Procedure Laterality Date  . Fracture surgery    . Esophagogastroduodenoscopy  05/05/2012    Procedure: ESOPHAGOGASTRODUODENOSCOPY (EGD);  Surgeon: Cleotis Nipper, MD;  Location: [facility] END[facility]COPY;  Service: Endoscopy;  Laterality: N/A;  Pediatric upper endoscope    History   Social History  . Marital Status: Widowed    Spouse Name: N/A    Number of Children: N/A  . Years of Education: N/A   Occupational History  . Not on file.   Social History Main Topics  . Smoking status: Never Smoker   . Smokeless tobacco: Not on file  . Alcohol Use: No  . Drug Use: No  . Sexual Activity: No   Other Topics Concern  . Not on file   Social History Narrative  . No narrative on file     reports that she has never smoked. She does not have any smokeless tobacco history on file. She reports that she does not drink alcohol or use illicit drugs.  Immunization History  Administered Date(s) Administered  . Influenza Whole 03/03/2013  . Pneumococcal-Unspecified 05/13/2000    No Known Allergies  Medications: Patient's Medications  New Prescriptions   No medications on file  Previous Medications  ACETAMINOPHEN (TYLENOL) 325 MG TABLET    Take 650 mg by mouth 4 (four) times daily.    ACYCLOVIR (ZOVIRAX) 200 MG CAPSULE    Take 200 mg by mouth daily.   AMOXICILLIN (AMOXIL) 500 MG CAPSULE    Take 4 capsules by mouth prior to dental procedure as directed by the access dental care staff   ARTIFICIAL TEAR OP    Place 1-2 drops into both eyes 4 (four) times daily.   CALCIUM-VITAMIN D (OSCAL WITH D) 500-200 MG-UNIT PER TABLET    Take 1 tablet by mouth daily.   CELECOXIB (CELEBREX) 200 MG CAPSULE     Take 200 mg by mouth daily.   DULOXETINE (CYMBALTA) 30 MG CAPSULE    Take 30 mg by mouth 2 (two) times daily.   FLUTICASONE-SALMETEROL (ADVAIR) 100-50 MCG/DOSE AEPB    Inhale 1 puff into the lungs every 12 (twelve) hours.   FUROSEMIDE (LASIX) 20 MG TABLET    Take 20 mg by mouth daily.   GABAPENTIN (NEURONTIN) 100 MG CAPSULE    Take 100 mg by mouth 3 (three) times daily.   IPRATROPIUM-ALBUTEROL (DUONEB) 0.5-2.5 (3) MG/3ML SOLN    Take 3 mLs by nebulization every 6 (six) hours as needed (shortness of breath).   ISOSORBIDE MONONITRATE (IMDUR) 30 MG 24 HR TABLET    Take 30 mg by mouth daily.   LEVOTHYROXINE (SYNTHROID, LEVOTHROID) 25 MCG TABLET    Take 25 mcg by mouth daily.    LOSARTAN (COZAAR) 50 MG TABLET    Take 50 mg by mouth 2 (two) times daily.   MEMANTINE HCL ER (NAMENDA XR) 7 MG CP24    Take 14 mg by mouth daily.   NEBIVOLOL (BYSTOLIC) 10 MG TABLET    Take 1 tablet (10 mg total) by mouth daily.   OMEPRAZOLE (PRILOSEC) 40 MG CAPSULE    Take 40 mg by mouth 2 (two) times daily.   POTASSIUM CHLORIDE (MICRO-K) 10 MEQ CR CAPSULE    Take 10 mEq by mouth 2 (two) times daily.   SENNA-DOCUSATE (SENOKOT S) 8.6-50 MG PER TABLET    Take 1 tablet by mouth 2 (two) times daily.   TIOTROPIUM (SPIRIVA) 18 MCG INHALATION CAPSULE    Place 18 mcg into inhaler and inhale daily.   TRAMADOL (ULTRAM) 50 MG TABLET    Take 1 tablet (50 mg total) by mouth every 6 (six) hours. 1 by mouth three times daily (6 am, 12pm, 6 pm, 12 midnight)  Modified Medications   No medications on file  Discontinued Medications   No medications on file     Review of Systems  Constitutional: Positive for activity change and fatigue. Negative for fever, chills and appetite change.  HENT: Positive for hearing loss. Negative for sore throat and trouble swallowing.   Eyes: Positive for visual disturbance (Corrective lenses).  Respiratory: Positive for cough and wheezing. Negative for choking and shortness of breath.        Chronic  cough, has been on HHI to manage wheezes and cough  Cardiovascular: Negative for chest pain, palpitations and leg swelling.  Gastrointestinal: Positive for constipation. Negative for nausea and abdominal pain.  Endocrine: Negative for cold intolerance, heat intolerance, polydipsia and polyphagia.  Genitourinary: Positive for frequency. Negative for urgency, vaginal discharge, difficulty urinating and vaginal pain.  Musculoskeletal: Positive for arthralgias, gait problem, neck pain and neck stiffness.       Left hip s/p ORIF and chronic pain, had Ortho consultation and no further surgical procedure recommended  Skin:       Laceration right brow area and multiple abrasions of the arms and hands.  Neurological: Positive for numbness. Negative for tremors and weakness.       Moderate dementia.  Hematological: Negative.   Psychiatric/Behavioral: Positive for hallucinations.       Remote hx of hallucination treated with Seroquel    Filed Vitals:   12/09/13 1257  BP: 145/72  Pulse: 81  Temp: 98.4 F (36.9 C)  Resp: 24  Height: 5' (1.524 m)  Weight: 158 lb (71.668 kg)  SpO2: 96%   Body mass index is 30.86 kg/(m^2).  Physical Exam  Constitutional: She appears well-developed and well-nourished.  HENT:  Head: Normocephalic and atraumatic.  Eyes: Conjunctivae and EOM are normal. Pupils are equal, round, and reactive to light.  Neck: Normal range of motion. No JVD present. No tracheal deviation present. No thyromegaly present.  Chronic decreased lateral ROM of the neck   Cardiovascular: Normal rate, regular rhythm and normal heart sounds.   No murmur heard. HR 50-60s  Pulmonary/Chest: Effort normal. No respiratory distress. She has no wheezes. She has no rales.  Abdominal: Soft. Bowel sounds are normal. She exhibits no distension and no mass. There is no tenderness.  Musculoskeletal: She exhibits edema and tenderness.  Mainly pain in neck and left hip. Lipo dermatosclerosis BLE from knee  down.   Lymphadenopathy:    She has no cervical adenopathy.  Neurological: She is alert. She has normal reflexes. No cranial nerve deficit. She exhibits normal muscle tone. Coordination normal.  Memory is failing.  Skin: Skin is warm and dry. No lesion and no rash noted. No erythema.  Skin lesion at bridge of her nose, new, scaly, superficial, non healing--11/17/12 Dermatology consultation: Bowen's disease-R leg. 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  BLE chronic venous insufficiency with mild erythema lower 1/3 of the BLE.  Lipo dermatosclerosis.  R eyebrow sutures intact. Left wrist bruise. Multiple skin tears R arm/hand.    Psychiatric: She has a normal mood and affect. Her behavior is normal. Thought content normal. Cognition and memory are impaired. She exhibits abnormal recent memory.     Labs reviewed: Admission on 12/04/2013, Discharged on 12/05/2013  Component Date Value Ref Range Status  . WBC 12/04/2013 14.6* 4.0 - 10.5 K/uL Final  . RBC 12/04/2013 4.03  3.87 - 5.11 MIL/uL Final  . Hemoglobin 12/04/2013 12.9  12.0 - 15.0 g/dL Final  . HCT 12/04/2013 38.8  36.0 - 46.0 % Final  . MCV 12/04/2013 96.3  78.0 - 100.0 fL Final  . MCH 12/04/2013 32.0  26.0 - 34.0 pg Final  . MCHC 12/04/2013 33.2  30.0 - 36.0 g/dL Final  . RDW 12/04/2013 14.1  11.5 - 15.5 % Final  . Platelets 12/04/2013 331  150 - 400 K/uL Final  . Neutrophils Relative % 12/04/2013 79* 43 - 77 % Final  . Neutro Abs 12/04/2013 11.5* 1.7 - 7.7 K/uL Final  . Lymphocytes Relative 12/04/2013 11* 12 - 46 % Final  . Lymphs Abs 12/04/2013 1.6  0.7 - 4.0 K/uL Final  . Monocytes Relative 12/04/2013 8  3 - 12 % Final  . Monocytes Absolute 12/04/2013 1.2* 0.1 - 1.0 K/uL Final  . Eosinophils Relative 12/04/2013 2  0 - 5 % Final  . Eosinophils Absolute 12/04/2013 0.2  0.0 - 0.7 K/uL Final  . Basophils Relative 12/04/2013 0  0 - 1 % Final  . Basophils Absolute 12/04/2013 0.1  0.0 -  0.1 K/uL Final  . Prothrombin Time 12/04/2013 13.1  11.6 - 15.2 seconds Final  . INR 12/04/2013 0.99  0.00 - 1.49 Final  . ABO/RH(D) 12/04/2013 A POS   Final  . Antibody Screen 12/04/2013 NEG   Final  . Sample Expiration 12/04/2013 12/07/2013   Final  . Sodium 12/04/2013 142  137 - 147 mEq/L Final  . Potassium 12/04/2013 3.9  3.7 - 5.3 mEq/L Final  . Chloride 12/04/2013 104  96 - 112 mEq/L Final  . CO2 12/04/2013 25  19 - 32 mEq/L Final  . Glucose, Bld 12/04/2013 94  70 - 99 mg/dL Final  . BUN 12/04/2013 16  6 - 23 mg/dL Final  . Creatinine, Ser 12/04/2013 0.96  0.50 - 1.10 mg/dL Final  . Calcium 12/04/2013 8.9  8.4 - 10.5 mg/dL Final  . Total Protein 12/04/2013 6.6  6.0 - 8.3 g/dL Final  . Albumin 12/04/2013 2.8* 3.5 - 5.2 g/dL Final  . AST 12/04/2013 20  0 - 37 U/L Final  . ALT 12/04/2013 13  0 - 35 U/L Final  . Alkaline Phosphatase 12/04/2013 96  39 - 117 U/L Final  . Total Bilirubin 12/04/2013 0.3  0.3 - 1.2 mg/dL Final  . GFR calc non Af Amer 12/04/2013 49* >90 mL/min Final  . GFR calc Af Amer 12/04/2013 57* >90 mL/min Final   Comment: (NOTE)                          The eGFR has been calculated using the CKD EPI equation.                          This calculation has not been validated in all clinical situations.                          eGFR's persistently <90 mL/min signify possible Chronic Kidney                          Disease.  . Anion gap 12/04/2013 13  5 - 15 Final  . ABO/RH(D) 12/04/2013 A POS   Final  . WBC 12/05/2013 10.2  4.0 - 10.5 K/uL Final  . RBC 12/05/2013 3.68* 3.87 - 5.11 MIL/uL Final  . Hemoglobin 12/05/2013 11.6* 12.0 - 15.0 g/dL Final  . HCT 12/05/2013 36.4  36.0 - 46.0 % Final  . MCV 12/05/2013 98.9  78.0 - 100.0 fL Final  . MCH 12/05/2013 31.5  26.0 - 34.0 pg Final  . MCHC 12/05/2013 31.9  30.0 - 36.0 g/dL Final  . RDW 12/05/2013 14.5  11.5 - 15.5 % Final  . Platelets 12/05/2013 308  150 - 400 K/uL Final  . Sodium 12/05/2013 143  137 - 147 mEq/L  Final  . Potassium 12/05/2013 4.2  3.7 - 5.3 mEq/L Final  . Chloride 12/05/2013 106  96 - 112 mEq/L Final  . CO2 12/05/2013 24  19 - 32 mEq/L Final  . Glucose, Bld 12/05/2013 77  70 - 99 mg/dL Final  . BUN 12/05/2013 17  6 - 23 mg/dL Final  . Creatinine, Ser 12/05/2013 0.92  0.50 - 1.10 mg/dL Final  . Calcium 12/05/2013 8.6  8.4 - 10.5 mg/dL Final  . GFR calc non Af Amer 12/05/2013 52* >90 mL/min Final  . GFR calc Af Amer 12/05/2013 60* >90 mL/min Final  Comment: (NOTE)                          The eGFR has been calculated using the CKD EPI equation.                          This calculation has not been validated in all clinical situations.                          eGFR's persistently <90 mL/min signify possible Chronic Kidney                          Disease.  . Anion gap 12/05/2013 13  5 - 15 Final  Lab on 11/30/2013  Component Date Value Ref Range Status  . Glucose 11/29/2013 86   Final  . BUN 11/29/2013 14  4 - 21 mg/dL Final  . Creatinine 11/29/2013 0.9  0.5 - 1.1 mg/dL Final  . Potassium 11/29/2013 4.3  3.4 - 5.3 mmol/L Final  . Sodium 11/29/2013 140  137 - 147 mmol/L Final  Lab on 10/22/2013  Component Date Value Ref Range Status  . TSH 10/21/2013 2.77  0.41 - 5.90 uIU/mL Final  Nursing Home on 10/19/2013  Component Date Value Ref Range Status  . Glucose 10/18/2013 82   Final  . BUN 10/18/2013 25* 4 - 21 mg/dL Final  . Creatinine 10/18/2013 1.0  0.5 - 1.1 mg/dL Final  . Potassium 10/18/2013 3.9  3.4 - 5.3 mmol/L Final  . Sodium 10/18/2013 138  137 - 147 mmol/L Final   10/19/13 2-D echocardiogram: LVEF 60%. Moderate left ventricular hypertrophy and impaired diastolic relaxation phase. Mild tricuspid regurgitation and mitral regurgitation.  Assessment/Plan  1. Fall at nursing home, subsequent encounter Patient is recovering from her fall. The situation is complicated by a chronic discomfort nonetheless I due to failure of bone support of her previous prosthesis in this  area. Lacerations of the forehead are healing as are the abrasions on her arms and hands.  2. Essential hypertension: controlled  3. Pain in left hip Chronic discomfort with an acute component.  4. Dementia, without behavioral disturbance Slowly progressive. Unchanged.  5. Chronic diastolic CHF (congestive heart failure) Compensated

## 2013-12-14 ENCOUNTER — Non-Acute Institutional Stay (SKILLED_NURSING_FACILITY): Payer: Medicare Other | Admitting: Nurse Practitioner

## 2013-12-14 ENCOUNTER — Encounter: Payer: Self-pay | Admitting: Nurse Practitioner

## 2013-12-14 DIAGNOSIS — E039 Hypothyroidism, unspecified: Secondary | ICD-10-CM

## 2013-12-14 DIAGNOSIS — J181 Lobar pneumonia, unspecified organism: Principal | ICD-10-CM

## 2013-12-14 DIAGNOSIS — I10 Essential (primary) hypertension: Secondary | ICD-10-CM

## 2013-12-14 DIAGNOSIS — F329 Major depressive disorder, single episode, unspecified: Secondary | ICD-10-CM

## 2013-12-14 DIAGNOSIS — F03918 Unspecified dementia, unspecified severity, with other behavioral disturbance: Secondary | ICD-10-CM

## 2013-12-14 DIAGNOSIS — M25559 Pain in unspecified hip: Secondary | ICD-10-CM

## 2013-12-14 DIAGNOSIS — F32A Depression, unspecified: Secondary | ICD-10-CM

## 2013-12-14 DIAGNOSIS — K5900 Constipation, unspecified: Secondary | ICD-10-CM

## 2013-12-14 DIAGNOSIS — K219 Gastro-esophageal reflux disease without esophagitis: Secondary | ICD-10-CM

## 2013-12-14 DIAGNOSIS — R0781 Pleurodynia: Secondary | ICD-10-CM

## 2013-12-14 DIAGNOSIS — M25552 Pain in left hip: Secondary | ICD-10-CM

## 2013-12-14 DIAGNOSIS — J189 Pneumonia, unspecified organism: Secondary | ICD-10-CM

## 2013-12-14 DIAGNOSIS — F3289 Other specified depressive episodes: Secondary | ICD-10-CM

## 2013-12-14 DIAGNOSIS — G589 Mononeuropathy, unspecified: Secondary | ICD-10-CM

## 2013-12-14 DIAGNOSIS — G629 Polyneuropathy, unspecified: Secondary | ICD-10-CM

## 2013-12-14 DIAGNOSIS — R609 Edema, unspecified: Secondary | ICD-10-CM

## 2013-12-14 DIAGNOSIS — F0391 Unspecified dementia with behavioral disturbance: Secondary | ICD-10-CM

## 2013-12-14 DIAGNOSIS — J42 Unspecified chronic bronchitis: Secondary | ICD-10-CM

## 2013-12-14 DIAGNOSIS — I872 Venous insufficiency (chronic) (peripheral): Secondary | ICD-10-CM

## 2013-12-14 DIAGNOSIS — R079 Chest pain, unspecified: Secondary | ICD-10-CM

## 2013-12-14 NOTE — Assessment & Plan Note (Signed)
Hx GI bleed, stable, Hgb11.9 07/14/11-11.6 09/29/39, off  Carafate 12/14/12 and  continue Omeprazole 40mg bid.   

## 2013-12-14 NOTE — Assessment & Plan Note (Signed)
Chronic cough  better since Furosemide. Continue Spiriva and Advair.    

## 2013-12-14 NOTE — Assessment & Plan Note (Signed)
Stable on Imdur 30mg. Last chest pain 06/30/13 relived with NTG and MaaLox.    

## 2013-12-14 NOTE — Assessment & Plan Note (Signed)
Elevated Sbp, continue Bystolic 10mg  and Losartan 50mg  bid. Adding amlodipine

## 2013-12-14 NOTE — Assessment & Plan Note (Signed)
takes Levothyroxine 25mcg daily, TSH 2.741 05/20/13. 2.772 10/21/13 

## 2013-12-14 NOTE — Assessment & Plan Note (Signed)
chronic since the left hip fx surgical repair 06/2010-pain is managed with Tramadol 50mg qid,Tylenol 650mg qid, Celebrex 200mg daily. W/c for mobility when out of bed. X-ray 01/13/13 showed no new acute fracture, subluxation, or dislocation. Also showed old healed fracture deformity at the left femoral head, neck, and intertrochanteric region with orthopedic rod, plate and screws spanning form superolateral to the femoral head to the proximal shaft. Potion of the orthopedic screw appear outside the confines of the femoral head. s/p Orthopedic consultation.    

## 2013-12-14 NOTE — Progress Notes (Signed)
Patient ID: [name] [name], female   DOB: [birth year], 80 y.o.   MRN: [id number]   Code Status: DNR  No Known Allergies  Chief Complaint  Patient presents with  . Medical Management of Chronic Issues  . Acute Visit    R sided rib pain.     HPI: Patient is a 80 y.o. female seen in the SNF at [facility] today for evaluation of right sided rib cage pain  and chronic medical conditions.     Hospitalized 12/04/2013 -12/05/2013. 12/04/13 presented to ED following a fall at nursing home with associated left hip pain. Unable to obtain significant history from patient secondary to advanced dementia but she states that she fell and denies LOC. The patient was recently given a walker for ambulating and when she got out of bed early.he did not use her walker and fell hitting her head and the left hip to the floor. Orthopedics who have reviewed imaging and feel that it is a chronic problem with progressive erosion of the femoral head and protrusion of hardware and plan nonsurgical management.   Problem List Items Addressed This Visit   Unspecified hypothyroidism (Chronic)     takes Levothyroxine daily, TSH 2.741 05/20/13. 2.772 10/21/13      GERD (gastroesophageal reflux disease)     Hx GI bleed, stable, Hgb11.9 07/14/11-11.6 09/29/39, off  Carafate 12/14/12 and  continue Omeprazole 40mg  bid.      Dementia     Gradual decline, takes Namenda and lives in [place].        Neuropathy     Stable on Gabapentin 100mg  tid and Tylenol 650mg  qid      Depression     takes Cymbalta 30mg  bid-stable.        Unspecified chronic bronchitis     Chronic cough  better since Furosemide. Continue Spiriva and Advair.       HTN (hypertension)     Elevated Sbp, continue Bystolic 10mg  and Losartan 50mg  bid. Adding amlodipine       Pain in left hip     chronic since the left hip fx surgical repair 06/2010-pain is managed with Tramadol 50mg  qid,Tylenol 650mg  qid, Celebrex 200mg  daily. W/c for mobility when  out of bed. X-ray 01/13/13 showed no new acute fracture, subluxation, or dislocation. Also showed old healed fracture deformity at the left femoral head, neck, and intertrochanteric region with orthopedic rod, plate and screws spanning form superolateral to the femoral head to the proximal shaft. Potion of the orthopedic screw appear outside the confines of the femoral head. s/p Orthopedic consultation.        Unspecified constipation     10/18/13 Senokot S bid. Observe.       Edema     takes Furosemide 20mg  and Kcl daily. BLE edema: staff reported 1+ edema R>L. Will weight the daily-then re-evaluate. Observe for s/s of developing CHF.     Rib pain on right side     12/14/13 CXR cardiomegaly and pulmonary hypertension w/o other overt CHF findings. Shallow inflation study with basilar atelectatic changes, but also suspect superimposed right basilar pneumonia. Treating PNA with Levaquin 500mg  qd x 10 days. Update X-ray Rib series.     Pneumonia involving right lung - Primary     12/14/13 CXR cardiomegaly and pulmonary hypertension w/o other overt CHF findings. Shallow inflation study with basilar atelectatic changes, but also suspect superimposed right basilar pneumonia. Treating PNA with Levaquin 500mg  qd x 10 days. Update X-ray Rib series.  Chronic venous insufficiency     Chronic edema BLE R>L, mild erythema noted. Presently taking ABT for PNA-observe for cellulitis.        Review of Systems:  Review of Systems  Constitutional: Negative for fever, chills, weight loss, malaise/fatigue and diaphoresis.  HENT: Positive for hearing loss. Negative for congestion, ear pain and sore throat.   Eyes: Negative for pain, discharge and redness.  Respiratory: Positive for cough. Negative for sputum production, shortness of breath and wheezing.        Chronic cough.   Cardiovascular: Positive for leg swelling (trace in ankle, chronic, venous insufficiency). Negative for chest pain, orthopnea,  claudication and PND.       Reported chest pain x1 06/30/13. 1+edema BLE  Gastrointestinal: Negative for heartburn, nausea, vomiting, abdominal pain, diarrhea, constipation and blood in stool.          Genitourinary: Positive for frequency. Negative for dysuria, urgency and flank pain.  Musculoskeletal: Positive for back pain, joint pain (left hip pain. ), myalgias and neck pain. Negative for falls.       C/o right rib pain.   Skin: Negative for itching and rash.       BLE chronic venous insufficiency with mild erythema lower 1/3 of the BLE. Lipo dermatosclerosis. R eyebrow sutures intact. Left wrist bruise. Multiple skin tears R arm/hand.   Neurological: Negative for dizziness, tingling, tremors, speech change, focal weakness, seizures, loss of consciousness and weakness. Sensory change: periphearl neuropathy.  Endo/Heme/Allergies: Negative for environmental allergies and polydipsia. Does not bruise/bleed easily.  Psychiatric/Behavioral: Positive for memory loss. Negative for depression and hallucinations. The patient is not nervous/anxious and does not have insomnia.      Past Medical History  Diagnosis Date  . Hypertension   . COPD (chronic obstructive pulmonary disease)   . Chronic diastolic CHF (congestive heart failure)   . Coronary artery disease   . Renal disorder   . GERD (gastroesophageal reflux disease)   . Osteoporosis   . Syncope   . Dementia   . Anemia   . Parkinson disease   . Neuropathy   . Thyroid disease   . Hyperlipidemia   . Anxiety   . Depression   . 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 UTI (lower urinary tract infection) 02/07/2013  . Unspecified hypothyroidism 08/25/2012    10/21/13 TSH 2.772   . Unspecified constipation 10/19/2013    10/18/13 Senokot S bid.    Marland Kitchen. Unspecified chronic bronchitis 10/16/2012  . Herpes genitalis in women 10/16/2012   Past Surgical History  Procedure Laterality Date  . Fracture  surgery    . Esophagogastroduodenoscopy  05/05/2012    Procedure: ESOPHAGOGASTRODUODENOSCOPY (EGD);  Surgeon: Florencia Reasonsobert V Buccini, MD;  [facility];  Service: Endoscopy;  Laterality: N/A;  Pediatric upper endoscope   Social History:   reports that she has never smoked. She does not have any smokeless tobacco history on file. She reports that she does not drink alcohol or use illicit drugs.  Medications: Patient's Medications  New Prescriptions   No medications on file  Previous Medications   ACETAMINOPHEN (TYLENOL) 325 MG TABLET    Take 650 mg by mouth 4 (four) times daily.    ACYCLOVIR (ZOVIRAX) 200 MG CAPSULE    Take 200 mg by mouth daily.   AMOXICILLIN (AMOXIL) 500 MG CAPSULE    Take 4 capsules by mouth prior to dental procedure as directed by the access  dental care staff   ARTIFICIAL TEAR OP    Place 1-2 drops into both eyes 4 (four) times daily.   CALCIUM-VITAMIN D (OSCAL WITH D) 500-200 MG-UNIT PER TABLET    Take 1 tablet by mouth daily.   CELECOXIB (CELEBREX) 200 MG CAPSULE    Take 200 mg by mouth daily.   DULOXETINE (CYMBALTA) 30 MG CAPSULE    Take 30 mg by mouth 2 (two) times daily.   FLUTICASONE-SALMETEROL (ADVAIR) 100-50 MCG/DOSE AEPB    Inhale 1 puff into the lungs every 12 (twelve) hours.   FUROSEMIDE (LASIX) 20 MG TABLET    Take 20 mg by mouth daily.   GABAPENTIN (NEURONTIN) 100 MG CAPSULE    Take 100 mg by mouth 3 (three) times daily.   IPRATROPIUM-ALBUTEROL (DUONEB) 0.5-2.5 (3) MG/3ML SOLN    Take 3 mLs by nebulization every 6 (six) hours as needed (shortness of breath).   ISOSORBIDE MONONITRATE (IMDUR) 30 MG 24 HR TABLET    Take 30 mg by mouth daily.   LEVOTHYROXINE (SYNTHROID, LEVOTHROID) 25 MCG TABLET    Take 25 mcg by mouth daily.    LOSARTAN (COZAAR) 50 MG TABLET    Take 50 mg by mouth 2 (two) times daily.   MEMANTINE HCL ER (NAMENDA XR) 7 MG CP24    Take 14 mg by mouth daily.   NEBIVOLOL (BYSTOLIC) 10 MG TABLET    Take 1 tablet (10 mg total) by mouth daily.    OMEPRAZOLE (PRILOSEC) 40 MG CAPSULE    Take 40 mg by mouth 2 (two) times daily.   POTASSIUM CHLORIDE (MICRO-K) 10 MEQ CR CAPSULE    Take 10 mEq by mouth 2 (two) times daily.   SENNA-DOCUSATE (SENOKOT S) 8.6-50 MG PER TABLET    Take 1 tablet by mouth 2 (two) times daily.   TIOTROPIUM (SPIRIVA) 18 MCG INHALATION CAPSULE    Place 18 mcg into inhaler and inhale daily.   TRAMADOL (ULTRAM) 50 MG TABLET    Take 1 tablet (50 mg total) by mouth every 6 (six) hours. 1 by mouth three times daily (6 am, 12pm, 6 pm, 12 midnight)  Modified Medications   No medications on file  Discontinued Medications   No medications on file     Physical Exam: Physical Exam  Constitutional: She is oriented to person, place, and time. She appears well-developed and well-nourished.  HENT:  Head: Normocephalic and atraumatic.  Eyes: Conjunctivae and EOM are normal. Pupils are equal, round, and reactive to light.  Neck: Normal range of motion. No JVD present. No thyromegaly present.  Chronic decreased lateral ROM of the neck   Cardiovascular: Regular rhythm and normal heart sounds.   No murmur heard. HR 50-60s  Pulmonary/Chest: Effort normal. She has no wheezes. She has no rales.  Abdominal: Soft. Bowel sounds are normal.  Musculoskeletal: She exhibits edema and tenderness.  Mainly pain in neck and left hip. 1+ edema BLE. Lipo dermatosclerosis BLE from knee down. R sided rib cage tenderness when palpated, but not with deep breath.   Lymphadenopathy:    She has no cervical adenopathy.  Neurological: She is alert and oriented to person, place, and time. She has normal reflexes. No cranial nerve deficit. She exhibits normal muscle tone. Coordination normal.  Skin: Skin is warm and dry. No lesion and no rash noted. No erythema.  New skin lesion at bridge of her nose, new, scaly, superficial, non healing--11/17/12 Dermatology consultation: Bowen's disease-R leg. Probable BCC nose-hold tx for now Bilateral lower  legs chronic  venous dermatitis. BLE chronic venous insufficiency with mild erythema lower 1/3 of the BLE.  BLE chronic venous insufficiency with mild erythema lower 1/3 of the BLE. Lipo dermatosclerosis. R eyebrow sutures intact. Left wrist bruise. Multiple skin tears R arm/hand.    Psychiatric: Cognition and memory are impaired. She exhibits abnormal recent memory.    Filed Vitals:   12/14/13 1119  BP: 186/84  Pulse: 60  Temp: 99 F (37.2 C)  TempSrc: Tympanic  Resp: 18      Labs reviewed: Basic Metabolic Panel:  Recent Labs  16/10/96  05/20/13  10/21/13 11/29/13 12/04/13 0631 12/05/13 0416  NA  --   < >  --   < >  --  140 142 143  K  --   < >  --   < >  --  4.3 3.9 4.2  CL  --   --   --   --   --   --  104 106  CO2  --   --   --   --   --   --  25 24  GLUCOSE  --   --   --   --   --   --  94 77  BUN  --   < >  --   < >  --  14 16 17   CREATININE  --   < >  --   < >  --  0.9 0.96 0.92  CALCIUM  --   --   --   --   --   --  8.9 8.6  TSH 5.47  --  2.74  --  2.77  --   --   --   < > = values in this interval not displayed. CBC:  Recent Labs  06/12/13 12/04/13 0631 12/05/13 0416  WBC 6.8 14.6* 10.2  NEUTROABS  --  11.5*  --   HGB 14.0 12.9 11.6*  HCT 42 38.8 36.4  MCV  --  96.3 98.9  PLT 271 331 308    Past procedures:  06/07/13 CXR borderline cardiomegaly unchanged with new minimal pulmonary vascular congestion, patchy bibasilar atelectasis or pneumonitis appears new.   12/04/13 CT head and cervical spine w/o contrast:  IMPRESSION:  1. Negative for bleed or other acute intracranial process.  2. Negative for cervical fracture or dislocation.  3. Stable parenchymal atrophy and nonspecific white matter changes.  4. Mild anterolisthesis C4-5 may be secondary to facet disease.  Flexion/extension radiographs would be required to exclude dynamic  instability.  12/04/13 X-ray R wrist:  IMPRESSION: 1. Osteopenia and degenerative change without fracture or other acute  abnormality.   12/04/13 X-ray left femur:  IMPRESSION: 1. Fragmentation/lysis of the left femoral head and neck with migration and erosion of previously placed fixation hardware into the supra-acetabular region.  12/04/13 X-ray L hip  IMPRESSION: 1. Interval lysis/fragmentation of femoral head and resultant migration of fixation hardware into the supra-acetabular region.   12/04/13 CXR  IMPRESSION: 1. Mild cardiomegaly and perihilar interstitial edema/infiltrates. 2. Interval increase pleural effusions.  Assessment/Plan Problem List Items Addressed This Visit   Unspecified hypothyroidism (Chronic)     takes Levothyroxine daily, TSH 2.741 05/20/13. Update TSH      Unspecified constipation     10/18/13 Senokot S bid. Observe.     Unspecified chronic bronchitis     Chronic cough. Recent developing CHF aggravated her chronic cough. It should be better since Furosemide.  Pain in left hip     chronic since the left hip fx surgical repair 06/2010-pain is managed with Tramadol 50mg  qid,Tylenol 650mg  qid, Celebrex 200mg  daily. W/c for mobility when out of bed. X-ray 01/13/13 showed no new acute fracture, subluxation, or dislocation. Also showed old healed fracture deformity at the left femoral head, neck, and intertrochanteric region with orthopedic rod, plate and screws spanning form superolateral to the femoral head to the proximal shaft. Potion of the orthopedic screw appear outside the confines of the femoral head. s/p Orthopedic consultation.         Neuropathy     Stable on Gabapentin 100mg  tid and Tylenol 650mg  qid       Nausea with vomiting - Primary     Resolved.     HTN (hypertension)     Elevated Sbp, denied chest pain, headaches, vision changes, or dizziness. Continue Bystolic 10mg  and Losartan 50mg  bid. Added Furosemide should help blood pressure. Continue to observe.      GERD (gastroesophageal reflux disease)     Hx GI bleed, stable, Hgb11.9 07/14/11-11.6  09/29/39, off  Carafate 12/14/12 and  continue Omeprazole 40mg  bid.     Dementia     Gradual decline, takes Namenda and lives in [place].         CHF (congestive heart failure)     Improved O2 desaturation, cough, BLE edema. CXR 10/14/13 mild to moderate congestive heart failure. 10/18/13 BNP 502.8. Continue Furosemide 10mg  Kcl daily. Obtain Echocardiogram to evaluate further.      CAD (coronary artery disease) of artery bypass graft     Stable on Imdur 30mg . Last chest pain 06/30/13 relived with NTG and MaaLox.       Anxiety     takes Cymbalta 30mg  bid-stable.           Family/ Staff Communication: observe the patient.   Goals of Care: SNF  Labs/tests ordered: CXR done 12/14/13. Rib series R ribs pending.

## 2013-12-14 NOTE — Assessment & Plan Note (Signed)
10/18/13 Senokot S bid. Observe.   

## 2013-12-14 NOTE — Assessment & Plan Note (Signed)
12/14/13 CXR cardiomegaly and pulmonary hypertension w/o other overt CHF findings. Shallow inflation study with basilar atelectatic changes, but also suspect superimposed right basilar pneumonia. Treating PNA with Levaquin 500mg qd x 10 days. Update X-ray Rib series.   

## 2013-12-14 NOTE — Assessment & Plan Note (Signed)
takes Cymbalta 30mg bid-stable.    

## 2013-12-14 NOTE — Assessment & Plan Note (Signed)
12/14/13 CXR cardiomegaly and pulmonary hypertension w/o other overt CHF findings. Shallow inflation study with basilar atelectatic changes, but also suspect superimposed right basilar pneumonia. Treating PNA with Levaquin 500mg  qd x 10 days. Update X-ray Rib series.

## 2013-12-14 NOTE — Assessment & Plan Note (Signed)
Stable on Gabapentin 100mg tid and Tylenol 650mg qid  

## 2013-12-14 NOTE — Assessment & Plan Note (Signed)
Gradual decline, takes Namenda and lives in SNF.  

## 2013-12-14 NOTE — Assessment & Plan Note (Addendum)
takes Furosemide 20mg  and Kcl <MEASUREMENRoy92m3[place] EndRoLinwood DibbWylineLinford [id number] Kendrick NWG:956GaBed Bath & Beyo70Deforest Hoyl242 Roy166m0[place] RoLinwood DibbWylineLinfor095Kendrick F3NWG:956GaBed Bath & BeyoDeforest Hoyl858 546DDalbert 44 Roy84m8Peters[place] RegionRoLinwood DibbWylineLinford 5151-82Achill0987026Kendrick F(9NWG:956GaBed Bath & Beyo45 [place] Hoy6 Roy36m5[facility] DavRoLinwood DibbWylineLinford 42464-27Ac0949414 Kendrick F3NWG:956GaBed Bath & Deforest Hoyl752 RoyRoLinwood Di09Kendrick F6NWG:956GaBed Bath & BeDeforest Hoyl249 Roy101m7[place] CoaRoLinwood DibbWylineLinford 9036-72A5097753Kendrick F7NWG:956GaBed Bath & BeyoDeforest Hoyl566 Roy28m1UniRoLinwood DibbWylineLinfo093Kendrick F(4NWG:956GaBed Bath & Beyo9128 SDeforest H56 Roy60m5[facility] OutpatieRoLinwood DibbWylineLinford [id number] F(9NWG:956GaBed Bath & Beyo9Deforest Hoyl7839<MEASUREMENSoutheast A71R38 2734Kendric884 Don23 Roy38m3Up HealRoLinwood DibbWylineLinford 096976 Kendrick F(7NWG:956GaBed Bath & BeDefor24 R8JerseyRoLinwoo097Kendrick F3NWG:9Bed Bath & Beyo2Deforest Hoyl(6DDalbert GarMichaelle WilHattiesburg Surgery Center 34Dona9583 Roy69m5VisioRoLinwood DibbWylineLinford 81759-02Achi36ll0967Kendrick F4NWG:956GaBed Bath & Beyo9480 EasDeforest Hoyl228-74 Roy59m2[place] EndRoLinwood DibbWylineLinfo0968168Kendrick F(9NWG:956GaBed Bath & BeyoDeforest Hoyl874 Roy53m3NacogdochRoLinwood DibbWylineLinford ([phone])3929AchKendrick F2NWG:956GaBed Bath & Beyo67 [place] Hoyl87 Roy36m0Eye Care SurgeryRoLinwood DibbWylineLinford Kendrick F5NWG:956GaBed Bath & Beyo7390Deforest Hoyl228 46616(DDalbert GarMichaelle WilleZ6X12372 Roy34m5KindreRoLinwood DibbWylineLinf0Kendrick F8NWG:956GaBed Bath & Beyo64Deforest Hoy86 Roy26m7JeRoLinwood DibbWylin091910Kendrick F2NWG:956GaBed Bath & Beyo78Deforest [phone](DDal30 Roy5m8AtlantaRoLinwood DibbWylineLinfordKendrick F9NWG:956GaBed Bath & Beyo2Deforest Hoyl([phone] Roy37m1WarrRoLinwood DibbWylineLinford ([phone])80947Kendrick NWG:956GaBed Bath & BeDeforest Roy78m1Mckenzie RoLinwood DibbWylineLinford (41921)37Ach48il09737Kendrick F8NWG:956GaBed Bath & BeyoDefo35 Roy72m4[facility] HospitRoLinwood DibbWylineLinford 09Kendrick F7NWG:956GaBed Bath & Beyo49Deforest Hoyl971-26DDalbert G56 Roy59m9KindredRoLinwood DibbWylineLinford 60609372Kendrick NWG:956GaBed Bath & Beyo9411Deforest 38 Roy84mRoLinwood DibbWylineLinford 77093799Kendrick F(40NWG:956GaBed Bath & BDeforest Hoyl(760) 23986DDalbe20 Roy66m9Campus SRoLinwood DibbWylineLinford [phone] 2732Ac095834Kendrick F3NWG:956GaBed Bath & BeyDeforest H46 Roy90m1North MetRoLinwood DibbWylineLinford [id number] SouthKendrick F2NWG:956GaBed Bath & Beyo6Deforest Hoyl([phone] Roy45m0[facility] HosRoLinwood DibbWylineLinford ([phone])16Ac4092546C South HoKendrick F8NWG:956GaBed Bath & Beyo9878 SDeforest H44 Roy22m6Acuity SpecRoLinwood DibbWylineLinford (909254Kendrick NWG:956GaBed Bath & BeDeforest65 R6WRoLinwood DiLinford 24417AchilleKendrick F(5NWG:9Bed Bath & BeyoDeforest Hoyl(215)17 Roy20m1[facility] Va RoLinwood DibbWylineLinford 31329 09Kendrick F4NWG:956GaBed Bath & BeyoDef67 Roy2m9[facility] SRoLinwood DibbWylineLinford (31727)0Kendrick F6NWG:956GaBed Bath & Beyo5Deforest Hoyl318-271DDalbert GarM50 Roy57m2[facility] Ambulatory EndoscoRoLinwood DibbWylineLinford 095926 Kendrick F(8NWG:956GaBed Bath & BDeforest [phone] Roy6m3[facility] RoLinwood DibbWylineLinford 9094932 BKendrick F(6NWG:956GaBed Bath & BeyDeforest Hoyl769-21746(DDalbert GarMich35 Roy79m8[facility] CenRoLinwood DibbWylineLinford ([phone] OlKendrick F(71NWG:956GaBed Bath & Beyo456 NDeforest Hoyl([phone] GarMichaelle Willey 7Z6X13 Roy5m9[facility] SRoLinwood DibbWylineLinford [id number] SilKendrick F7NWG:956GaBed Bath & Beyo60Deforest Hoyl530-28851 Roy7m0[facility] SurgeRoLinwood DibbWylineLinford 5834-62Achille91 RC9799 NW.095539 Kendrick F6NWG:956GaBed Bath & BeyoDeforest Hoyl319-475 Ro48m1[facility] ARoLinwood DibbWylineLinford 61468-54A409762 EastKendrick F7NWG:956GaBed Bath & BeyoDeforest 5 Roy40m4[facility] RoLinwood DibbWylineLinford ([phone])06A0971Kendrick F(4NWG:956GaBed Bath & Beyo7Deforest Hoyl62 Roy63m4Maui MemoriRoLinwood DibbWylineLinford (09679Kendrick F5NWG:956GaBed Bath & Beyo8Deforest Hoyl303 0144DDalbert GarM1 Roy17m0[facility] RoLinwood DibbWylineLinford 94923-82096531 NortKendrick F4NWG:956GaBed Bath & BeyDeforest Hoyl269-63444(DDalb78 Roy29m4[facility] SoRoLinwood DibbWylineLinford (55922)2Kendrick NWG:956GaBed Bath & Beyo479 [place] 76 Roy86m1[place] RoLinwood DibbWylineLinford [phone] 43A09Kendrick F3NWG:956GaBed Bath & BDeforest [phone] GarMi31 Roy39m4MarRoLinwood DibbWylineLinford 97337-68Ac32h0Kendrick F2NWG:956GaBed Bath & BDeforest Hoyl323-632DDalbert GarMi55 Roy32m2[facility] RoLinwood DibbWylineLinford 70731-10Achi40091Kendrick F7NWG:956GaBed Bath & BeDeforest Hoyl575 6755DDalbert 59 Roy51m0[facility] RoLinwood DibbWylineLinford 9490942Kendrick F(44NWG:956GaBed Bath & Beyo9060Deforest [phone] Roy35m9[facility] RoLinwood DibbWylineLinford 0947Kendrick F2NWG:956GaBed Bath & BeyoDeforest Hoy72 Roy68m9[facility] EndRoLinwood DibbWylineLinfoKendrick F2NWG:956GaBed Bath & Beyo9Deforest Hoyl985-4271 Roy36m3[facility] CenRoLinwood DibbWylineLinford 133Kendrick F(8NWG:956GaBed Bath & Beyo2Deforest Hoyl60 Roy9m1[facility] MemoriaRoLinwood DibbWylineLinford (84897)78A37chi8509665Kendrick F7NWG:956GaBed Bath & Beyo631 Roy36m4[facility] SRoLinwood DibbWyl09547 WalKendrick F7NWG:956GaBed Bath &Deforest Hoyl781-8232 Roy35m9[facility] - RoLinwood DibbWylineLinford [phone] 38Achil1091Kendrick F(2NWG:956GaBed Bath & BeyoDeforest Hoy19 Roy39m0[facility] PsyRoLinwood DibbWylineLinford094494Kendrick F5NWG:956GaBed Bath & BeyDeforest Hoy40 Roy82m5[facility] SRoLinwood DibbWylineLinford 25067 30912 Kendrick F7NWG:956GaBed Bath & BeyDeforest Hoyl544 Roy3RoLinwood DibbWylineLinford [phone] 63Ac52hil557 As6309351 Kendrick F(3NWG:956GaBed Bath & BeyDeforest Hoyl(956)56 Roy64m8[facility] Star HospitaRoLinwood DibbWylineLinford 7240922 E.Kendrick F3NWG:956GaBed Bath & BeyDeforest 44 Roy78m5[address] Anthony NoRoLinwood DibbWylineLinford 76250-0Kendrick F8NWG:956GaBed Bath & Beyo8579Defo49 Roy162m5[facility] A BaptiRoLinwood DibbWylineLinford [phone] 15A09683Kendrick F(9NWG:956GaBed Bath & Beyo438 [place]57 Roy66m5[place] BehavioRoLinwood DibbWylineLinf097982Kendrick F9NWG:956GaBed Bath & Beyo326Deforest H43 Ro88m3BrooksiRoLinwood DibbWylineLinf09930Kendrick F(4NWG:956GaBed Bath & Beyo7288Deforest Hoyl608 76DDa92w3Roy45aION60475-GaBed Bath & BeyondDefor Roy64m0OakbeRoLinwood DibbWylineLinford09Kendrick F7NWG:956GaBed Bath & BeDeforest Hoyl559-5749 Roy31m1[facility] HealtRoLinwood DibbWylineLinford ([phone] 52Ac0966 BeaKendrick F7NWG:956GaBed Bath & Beyo93Deforest Hoy52w3dEwing Schleint GarMichaelle Willey 9 South SouZ6X123456SiThe Menninger Cli35niDona95621 r s/s of developing CHF.

## 2013-12-17 DIAGNOSIS — I872 Venous insufficiency (chronic) (peripheral): Secondary | ICD-10-CM | POA: Insufficient documentation

## 2013-12-17 NOTE — Assessment & Plan Note (Signed)
Chronic edema BLE R>L, mild erythema noted. Presently taking ABT for PNA-observe for cellulitis.

## 2013-12-21 ENCOUNTER — Encounter: Payer: Self-pay | Admitting: Nurse Practitioner

## 2013-12-21 ENCOUNTER — Non-Acute Institutional Stay (SKILLED_NURSING_FACILITY): Payer: Medicare Other | Admitting: Nurse Practitioner

## 2013-12-21 DIAGNOSIS — E039 Hypothyroidism, unspecified: Secondary | ICD-10-CM

## 2013-12-21 DIAGNOSIS — F419 Anxiety disorder, unspecified: Secondary | ICD-10-CM

## 2013-12-21 DIAGNOSIS — K5900 Constipation, unspecified: Secondary | ICD-10-CM

## 2013-12-21 DIAGNOSIS — F0390 Unspecified dementia without behavioral disturbance: Secondary | ICD-10-CM

## 2013-12-21 DIAGNOSIS — M25552 Pain in left hip: Secondary | ICD-10-CM

## 2013-12-21 DIAGNOSIS — G589 Mononeuropathy, unspecified: Secondary | ICD-10-CM

## 2013-12-21 DIAGNOSIS — K219 Gastro-esophageal reflux disease without esophagitis: Secondary | ICD-10-CM

## 2013-12-21 DIAGNOSIS — I10 Essential (primary) hypertension: Secondary | ICD-10-CM

## 2013-12-21 DIAGNOSIS — F411 Generalized anxiety disorder: Secondary | ICD-10-CM

## 2013-12-21 DIAGNOSIS — M25559 Pain in unspecified hip: Secondary | ICD-10-CM

## 2013-12-21 DIAGNOSIS — G629 Polyneuropathy, unspecified: Secondary | ICD-10-CM

## 2013-12-21 DIAGNOSIS — R609 Edema, unspecified: Secondary | ICD-10-CM

## 2013-12-21 NOTE — Assessment & Plan Note (Signed)
takes Furosemide 20mg  and Kcl 10meq daily. BLE edema: staff reported 1+ edema R>L. Observe for s/s of developing CHF. Weights stable: # 150, #150, #152, #150 in the past 4 days.

## 2013-12-21 NOTE — Assessment & Plan Note (Signed)
Stable on Gabapentin 100mg  tid and Tylenol 650mg  q8hr

## 2013-12-21 NOTE — Assessment & Plan Note (Signed)
Gradual decline, takes Namenda and lives in SNF.  

## 2013-12-21 NOTE — Assessment & Plan Note (Signed)
12/04/13 Orthopedics who have reviewed imaging and feel that it is a chronic problem with progressive erosion of the femoral head and protrusion of hardware and plan nonsurgical management. 12/21/13 reduced Tramadol from 50mg q6hr to q8hr and Tylenol 650mg from q6hr to q8hr due to sleeping more during day.  Continue Gabapentin and Celebrex.    

## 2013-12-21 NOTE — Assessment & Plan Note (Signed)
12/21/13 decreased Cymbalta from 30mg bid to daily due to more sleepiness during day. Stable.   

## 2013-12-21 NOTE — Assessment & Plan Note (Signed)
Hx GI bleed, stable, Hgb11.9 07/14/11-11.6 09/29/39, off  Carafate 12/14/12 and  continue Omeprazole 40mg bid.   

## 2013-12-21 NOTE — Assessment & Plan Note (Signed)
takes Levothyroxine 25mcg daily, TSH 2.741 05/20/13. 2.772 10/21/13 

## 2013-12-21 NOTE — Assessment & Plan Note (Signed)
Stable on Imdur 30mg. Last chest pain 06/30/13 relived with NTG and MaaLox.    

## 2013-12-21 NOTE — Progress Notes (Signed)
Patient ID: [name] [name], female   DOB: 08-04-1917, 80 y.o.   MRN: [id number]   Code Status: DNR  No Known Allergies  Chief Complaint  Patient presents with  . Medical Management of Chronic Issues  . Acute Visit    edema, chronic L hip pain, depression.     HPI: Patient is a 80 y.o. female seen in the SNF at [facility] today for evaluation of left hip pain, depression,  and chronic medical conditions.     Hospitalized 12/04/2013 -12/05/2013. 12/04/13 presented to ED following a fall at nursing home with associated left hip pain. Unable to obtain significant history from patient secondary to advanced dementia but she states that she fell and denies LOC. The patient was recently given a walker for ambulating and when she got out of bed early.he did not use her walker and fell hitting her head and the left hip to the floor. Orthopedics who have reviewed imaging and feel that it is a chronic problem with progressive erosion of the femoral head and protrusion of hardware and plan nonsurgical management.   Problem List Items Addressed This Visit   Unspecified hypothyroidism (Chronic)     takes Levothyroxine daily, TSH 2.741 05/20/13. 2.772 10/21/13     GERD (gastroesophageal reflux disease)     Hx GI bleed, stable, Hgb11.9 07/14/11-11.6 09/29/39, off  Carafate 12/14/12 and  continue Omeprazole 40mg  bid.      Dementia     Gradual decline, takes Namenda and lives in [place].       Neuropathy     Stable on Gabapentin 100mg  tid and Tylenol 650mg  q8hr      Anxiety     12/21/13 decreased Cymbalta from 30mg  bid to daily due to more sleepiness during day. Stable.     HTN (hypertension)     Controlled, takes Bystolic 10mg , Losartan 50mg  bid, and amlodipine 10mg       Pain in left hip - Primary     12/04/13 Orthopedics who have reviewed imaging and feel that it is a chronic problem with progressive erosion of the femoral head and protrusion of hardware and plan nonsurgical  management. 12/21/13 reduced Tramadol from 50mg  q6hr to q8hr and Tylenol 650mg  from q6hr to q8hr due to sleeping more during day.  Continue Gabapentin and Celebrex.      Unspecified constipation     10/18/13 Senokot S bid. Observe.        Edema     takes Furosemide 20mg  and Kcl daily. BLE edema: staff reported 1+ edema R>L. Observe for s/s of developing CHF. Weights stable: # 150, #150, #152, #150 in the past 4 days.         Review of Systems:  Review of Systems  Constitutional: Negative for fever, chills, weight loss, malaise/fatigue and diaphoresis.  HENT: Positive for hearing loss. Negative for congestion, ear pain and sore throat.   Eyes: Negative for pain, discharge and redness.  Respiratory: Positive for cough. Negative for sputum production, shortness of breath and wheezing.        Chronic cough.   Cardiovascular: Positive for leg swelling (trace in ankle, chronic, venous insufficiency). Negative for chest pain, orthopnea, claudication and PND.       Reported chest pain x1 06/30/13. 1+edema BLE  Gastrointestinal: Negative for heartburn, nausea, vomiting, abdominal pain, diarrhea, constipation and blood in stool.          Genitourinary: Positive for frequency. Negative for dysuria, urgency and flank pain.  Musculoskeletal:  Positive for back pain, joint pain (left hip pain. ), myalgias and neck pain. Negative for falls.       C/o right rib pain.   Skin: Negative for itching and rash.       BLE chronic venous insufficiency with mild erythema lower 1/3 of the BLE. Lipo dermatosclerosis. R eyebrow sutures intact. Left wrist bruise. Multiple skin tears R arm/hand.   Neurological: Negative for dizziness, tingling, tremors, speech change, focal weakness, seizures, loss of consciousness and weakness. Sensory change: periphearl neuropathy.  Endo/Heme/Allergies: Negative for environmental allergies and polydipsia. Does not bruise/bleed easily.  Psychiatric/Behavioral: Positive for  memory loss. Negative for depression and hallucinations. The patient is not nervous/anxious and does not have insomnia.      Past Medical History  Diagnosis Date  . Hypertension   . COPD (chronic obstructive pulmonary disease)   . Chronic diastolic CHF (congestive heart failure)   . Coronary artery disease   . Renal disorder   . GERD (gastroesophageal reflux disease)   . Osteoporosis   . Syncope   . Dementia   . Anemia   . Parkinson disease   . Neuropathy   . Thyroid disease   . Hyperlipidemia   . Anxiety   . Depression   . 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 UTI (lower urinary tract infection) 02/07/2013  . Unspecified hypothyroidism 08/25/2012    10/21/13 TSH 2.772   . Unspecified constipation 10/19/2013    10/18/13 Senokot S bid.    Marland Kitchen Unspecified chronic bronchitis 10/16/2012  . Herpes genitalis in women 10/16/2012   Past Surgical History  Procedure Laterality Date  . Fracture surgery    . Esophagogastroduodenoscopy  05/05/2012    Procedure: ESOPHAGOGASTRODUODENOSCOPY (EGD);  Surgeon: [name], MD;  Location: [facility] ENDOSCOPY;  Service: Endoscopy;  Laterality: N/A;  Pediatric upper endoscope   Social History:   reports that she has never smoked. She does not have any smokeless tobacco history on file. She reports that she does not drink alcohol or use illicit drugs.  Medications: Patient's Medications  New Prescriptions   No medications on file  Previous Medications   ACETAMINOPHEN (TYLENOL) 325 MG TABLET    Take 650 mg by mouth every 8 (eight) hours.    ACYCLOVIR (ZOVIRAX) 200 MG CAPSULE    Take 200 mg by mouth daily.   AMOXICILLIN (AMOXIL) 500 MG CAPSULE    Take 4 capsules by mouth prior to dental procedure as directed by the access dental care staff   ARTIFICIAL TEAR OP    Place 1-2 drops into both eyes 4 (four) times daily.   CALCIUM-VITAMIN D (OSCAL WITH D) 500-200 MG-UNIT PER TABLET    Take 1 tablet by mouth  daily.   CELECOXIB (CELEBREX) 200 MG CAPSULE    Take 200 mg by mouth daily.   DULOXETINE (CYMBALTA) 30 MG CAPSULE    Take 30 mg by mouth daily.    FLUTICASONE-SALMETEROL (ADVAIR) 100-50 MCG/DOSE AEPB    Inhale 1 puff into the lungs every 12 (twelve) hours.   FUROSEMIDE (LASIX) 20 MG TABLET    Take 20 mg by mouth daily.   GABAPENTIN (NEURONTIN) 100 MG CAPSULE    Take 100 mg by mouth 3 (three) times daily.   IPRATROPIUM-ALBUTEROL (DUONEB) 0.5-2.5 (3) MG/3ML SOLN    Take 3 mLs by nebulization every 6 (six) hours as needed (shortness of breath).   ISOSORBIDE MONONITRATE (IMDUR) 30 MG 24 HR  TABLET    Take 30 mg by mouth daily.   LEVOTHYROXINE (SYNTHROID, LEVOTHROID) 25 MCG TABLET    Take 25 mcg by mouth daily.    LOSARTAN (COZAAR) 50 MG TABLET    Take 50 mg by mouth 2 (two) times daily.   MEMANTINE HCL ER (NAMENDA XR) 7 MG CP24    Take 14 mg by mouth daily.   NEBIVOLOL (BYSTOLIC) 10 MG TABLET    Take 1 tablet (10 mg total) by mouth daily.   OMEPRAZOLE (PRILOSEC) 40 MG CAPSULE    Take 40 mg by mouth 2 (two) times daily.   POTASSIUM CHLORIDE (MICRO-K) 10 MEQ CR CAPSULE    Take 10 mEq by mouth 2 (two) times daily.   SENNA-DOCUSATE (SENOKOT S) 8.6-50 MG PER TABLET    Take 1 tablet by mouth 2 (two) times daily.   TIOTROPIUM (SPIRIVA) 18 MCG INHALATION CAPSULE    Place 18 mcg into inhaler and inhale daily.  Modified Medications   Modified Medication Previous Medication   TRAMADOL (ULTRAM) 50 MG TABLET traMADol (ULTRAM) 50 MG tablet      Take 50 mg by mouth every 8 (eight) hours. 1 by mouth three times daily (6 am, 12pm, 6 pm, 12 midnight)    Take 1 tablet (50 mg total) by mouth every 6 (six) hours. 1 by mouth three times daily (6 am, 12pm, 6 pm, 12 midnight)  Discontinued Medications   No medications on file     Physical Exam: Physical Exam  Constitutional: She is oriented to person, place, and time. She appears well-developed and well-nourished.  HENT:  Head: Normocephalic and atraumatic.  Eyes:  Conjunctivae and EOM are normal. Pupils are equal, round, and reactive to light.  Neck: Normal range of motion. No JVD present. No thyromegaly present.  Chronic decreased lateral ROM of the neck   Cardiovascular: Regular rhythm and normal heart sounds.   No murmur heard. HR 50-60s  Pulmonary/Chest: Effort normal. She has no wheezes. She has no rales.  Abdominal: Soft. Bowel sounds are normal.  Musculoskeletal: She exhibits edema and tenderness.  Mainly pain in neck and left hip. 1+ edema BLE. Lipo dermatosclerosis BLE from knee down. R sided rib cage tenderness when palpated, but not with deep breath.   Lymphadenopathy:    She has no cervical adenopathy.  Neurological: She is alert and oriented to person, place, and time. She has normal reflexes. No cranial nerve deficit. She exhibits normal muscle tone. Coordination normal.  Skin: Skin is warm and dry. No lesion and no rash noted. No erythema.  New skin lesion at bridge of her nose, new, scaly, superficial, non healing--11/17/12 Dermatology consultation: Bowen's disease-R leg. 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 BLE chronic venous insufficiency with mild erythema lower 1/3 of the BLE.  BLE chronic venous insufficiency with mild erythema lower 1/3 of the BLE. Lipo dermatosclerosis. R eyebrow sutures intact. Left wrist bruise. Multiple skin tears R arm/hand.    Psychiatric: Cognition and memory are impaired. She exhibits abnormal recent memory.    Filed Vitals:   12/21/13 1410  BP: 132/80  Pulse: 58  Temp: 97 F (36.1 C)  TempSrc: Tympanic  Resp: 16      Labs reviewed: Basic Metabolic Panel:  Recent Labs  16/10/96  05/20/13  10/21/13 11/29/13 12/04/13 0631 12/05/13 0416  NA  --   < >  --   < >  --  140 142 143  K  --   < >  --   < >  --  4.3 3.9 4.2  CL  --   --   --   --   --   --  104 106  CO2  --   --   --   --   --   --  25 24  GLUCOSE  --   --   --   --   --   --  94 77    BUN  --   < >  --   < >  --  14 16 17   CREATININE  --   < >  --   < >  --  0.9 0.96 0.92  CALCIUM  --   --   --   --   --   --  8.9 8.6  TSH 5.47  --  2.74  --  2.77  --   --   --   < > = values in this interval not displayed. CBC:  Recent Labs  06/12/13 12/04/13 0631 12/05/13 0416  WBC 6.8 14.6* 10.2  NEUTROABS  --  11.5*  --   HGB 14.0 12.9 11.6*  HCT 42 38.8 36.4  MCV  --  96.3 98.9  PLT 271 331 308    Past procedures:  06/07/13 CXR borderline cardiomegaly unchanged with new minimal pulmonary vascular congestion, patchy bibasilar atelectasis or pneumonitis appears new.   12/04/13 CT head and cervical spine w/o contrast:  IMPRESSION:  1. Negative for bleed or other acute intracranial process.  2. Negative for cervical fracture or dislocation.  3. Stable parenchymal atrophy and nonspecific white matter changes.  4. Mild anterolisthesis C4-5 may be secondary to facet disease.  Flexion/extension radiographs would be required to exclude dynamic  instability.  12/04/13 X-ray R wrist:  IMPRESSION: 1. Osteopenia and degenerative change without fracture or other acute abnormality.   12/04/13 X-ray left femur:  IMPRESSION: 1. Fragmentation/lysis of the left femoral head and neck with migration and erosion of previously placed fixation hardware into the supra-acetabular region.  12/04/13 X-ray L hip  IMPRESSION: 1. Interval lysis/fragmentation of femoral head and resultant migration of fixation hardware into the supra-acetabular region.   12/04/13 CXR  IMPRESSION: 1. Mild cardiomegaly and perihilar interstitial edema/infiltrates. 2. Interval increase pleural effusions.  Assessment/Plan Problem List Items Addressed This Visit   Unspecified hypothyroidism (Chronic)     takes Levothyroxine 25mcg daily, TSH 2.741 05/20/13. Update TSH      Unspecified constipation     10/18/13 Senokot S bid. Observe.     Unspecified chronic bronchitis     Chronic cough. Recent  developing CHF aggravated her chronic cough. It should be better since Furosemide.      Pain in left hip     chronic since the left hip fx surgical repair 06/2010-pain is managed with Tramadol 50mg  qid,Tylenol 650mg  qid, Celebrex 200mg  daily. W/c for mobility when out of bed. X-ray 01/13/13 showed no new acute fracture, subluxation, or dislocation. Also showed old healed fracture deformity at the left femoral head, neck, and intertrochanteric region with orthopedic rod, plate and screws spanning form superolateral to the femoral head to the proximal shaft. Potion of the orthopedic screw appear outside the confines of the femoral head. s/p Orthopedic consultation.         Neuropathy     Stable on Gabapentin 100mg  tid and Tylenol 650mg  qid       Nausea with vomiting - Primary     Resolved.     HTN (hypertension)     Elevated  Sbp, denied chest pain, headaches, vision changes, or dizziness. Continue Bystolic 10mg  and Losartan 50mg  bid. Added Furosemide should help blood pressure. Continue to observe.      GERD (gastroesophageal reflux disease)     Hx GI bleed, stable, Hgb11.9 07/14/11-11.6 09/29/39, off  Carafate 12/14/12 and  continue Omeprazole 40mg  bid.     Dementia     Gradual decline, takes Namenda and lives in [place].         CHF (congestive heart failure)     Improved O2 desaturation, cough, BLE edema. CXR 10/14/13 mild to moderate congestive heart failure. 10/18/13 BNP 502.8. Continue Furosemide 10mg  Kcl daily. Obtain Echocardiogram to evaluate further.      CAD (coronary artery disease) of artery bypass graft     Stable on Imdur 30mg . Last chest pain 06/30/13 relived with NTG and MaaLox.       Anxiety     takes Cymbalta 30mg  bid-stable.           Family/ Staff Communication: observe the patient.   Goals of Care: SNF  Labs/tests ordered: none.

## 2013-12-21 NOTE — Assessment & Plan Note (Signed)
Controlled, takes Bystolic 10mg , Losartan 50mg  bid, and amlodipine 10mg 

## 2013-12-21 NOTE — Assessment & Plan Note (Signed)
10/18/13 Senokot S bid. Observe.   

## 2013-12-22 ENCOUNTER — Emergency Department (HOSPITAL_COMMUNITY): Payer: Medicare Other

## 2013-12-22 ENCOUNTER — Encounter (HOSPITAL_COMMUNITY): Payer: Self-pay | Admitting: Emergency Medicine

## 2013-12-22 ENCOUNTER — Inpatient Hospital Stay (HOSPITAL_COMMUNITY)
Admission: EM | Admit: 2013-12-22 | Discharge: 2013-12-28 | DRG: 388 | Disposition: A | Discharge status: Skilled Nursing Facility | Payer: Medicare Other | Attending: Internal Medicine | Admitting: Internal Medicine

## 2013-12-22 DIAGNOSIS — I5033 Acute on chronic diastolic (congestive) heart failure: Secondary | ICD-10-CM

## 2013-12-22 DIAGNOSIS — G3183 Dementia with Lewy bodies: Secondary | ICD-10-CM

## 2013-12-22 DIAGNOSIS — E785 Hyperlipidemia, unspecified: Secondary | ICD-10-CM | POA: Diagnosis present

## 2013-12-22 DIAGNOSIS — I10 Essential (primary) hypertension: Secondary | ICD-10-CM

## 2013-12-22 DIAGNOSIS — B3749 Other urogenital candidiasis: Secondary | ICD-10-CM | POA: Diagnosis present

## 2013-12-22 DIAGNOSIS — K56609 Unspecified intestinal obstruction, unspecified as to partial versus complete obstruction: Principal | ICD-10-CM

## 2013-12-22 DIAGNOSIS — N179 Acute kidney failure, unspecified: Secondary | ICD-10-CM | POA: Diagnosis present

## 2013-12-22 DIAGNOSIS — J690 Pneumonitis due to inhalation of food and vomit: Secondary | ICD-10-CM | POA: Diagnosis present

## 2013-12-22 DIAGNOSIS — N289 Disorder of kidney and ureter, unspecified: Secondary | ICD-10-CM

## 2013-12-22 DIAGNOSIS — D72829 Elevated white blood cell count, unspecified: Secondary | ICD-10-CM | POA: Diagnosis present

## 2013-12-22 DIAGNOSIS — Z79899 Other long term (current) drug therapy: Secondary | ICD-10-CM

## 2013-12-22 DIAGNOSIS — G8929 Other chronic pain: Secondary | ICD-10-CM | POA: Diagnosis present

## 2013-12-22 DIAGNOSIS — I2510 Atherosclerotic heart disease of native coronary artery without angina pectoris: Secondary | ICD-10-CM | POA: Diagnosis present

## 2013-12-22 DIAGNOSIS — J90 Pleural effusion, not elsewhere classified: Secondary | ICD-10-CM | POA: Diagnosis present

## 2013-12-22 DIAGNOSIS — A6000 Herpesviral infection of urogenital system, unspecified: Secondary | ICD-10-CM | POA: Diagnosis present

## 2013-12-22 DIAGNOSIS — J189 Pneumonia, unspecified organism: Secondary | ICD-10-CM

## 2013-12-22 DIAGNOSIS — M810 Age-related osteoporosis without current pathological fracture: Secondary | ICD-10-CM | POA: Diagnosis present

## 2013-12-22 DIAGNOSIS — F3289 Other specified depressive episodes: Secondary | ICD-10-CM | POA: Diagnosis present

## 2013-12-22 DIAGNOSIS — I129 Hypertensive chronic kidney disease with stage 1 through stage 4 chronic kidney disease, or unspecified chronic kidney disease: Secondary | ICD-10-CM | POA: Diagnosis present

## 2013-12-22 DIAGNOSIS — R609 Edema, unspecified: Secondary | ICD-10-CM

## 2013-12-22 DIAGNOSIS — R4182 Altered mental status, unspecified: Secondary | ICD-10-CM | POA: Diagnosis present

## 2013-12-22 DIAGNOSIS — N183 Chronic kidney disease, stage 3 unspecified: Secondary | ICD-10-CM | POA: Diagnosis present

## 2013-12-22 DIAGNOSIS — J449 Chronic obstructive pulmonary disease, unspecified: Secondary | ICD-10-CM | POA: Diagnosis present

## 2013-12-22 DIAGNOSIS — F0280 Dementia in other diseases classified elsewhere without behavioral disturbance: Secondary | ICD-10-CM | POA: Diagnosis present

## 2013-12-22 DIAGNOSIS — N189 Chronic kidney disease, unspecified: Secondary | ICD-10-CM

## 2013-12-22 DIAGNOSIS — I5030 Unspecified diastolic (congestive) heart failure: Secondary | ICD-10-CM

## 2013-12-22 DIAGNOSIS — J4489 Other specified chronic obstructive pulmonary disease: Secondary | ICD-10-CM | POA: Diagnosis present

## 2013-12-22 DIAGNOSIS — Z8701 Personal history of pneumonia (recurrent): Secondary | ICD-10-CM

## 2013-12-22 DIAGNOSIS — R0600 Dyspnea, unspecified: Secondary | ICD-10-CM

## 2013-12-22 DIAGNOSIS — E039 Hypothyroidism, unspecified: Secondary | ICD-10-CM | POA: Diagnosis present

## 2013-12-22 DIAGNOSIS — Z9089 Acquired absence of other organs: Secondary | ICD-10-CM

## 2013-12-22 DIAGNOSIS — I509 Heart failure, unspecified: Secondary | ICD-10-CM | POA: Diagnosis present

## 2013-12-22 DIAGNOSIS — Z87440 Personal history of urinary (tract) infections: Secondary | ICD-10-CM | POA: Diagnosis not present

## 2013-12-22 DIAGNOSIS — F329 Major depressive disorder, single episode, unspecified: Secondary | ICD-10-CM | POA: Diagnosis present

## 2013-12-22 DIAGNOSIS — K219 Gastro-esophageal reflux disease without esophagitis: Secondary | ICD-10-CM | POA: Diagnosis present

## 2013-12-22 DIAGNOSIS — K560 Paralytic ileus: Secondary | ICD-10-CM | POA: Diagnosis present

## 2013-12-22 DIAGNOSIS — K567 Ileus, unspecified: Secondary | ICD-10-CM | POA: Diagnosis present

## 2013-12-22 DIAGNOSIS — G9340 Encephalopathy, unspecified: Secondary | ICD-10-CM | POA: Diagnosis present

## 2013-12-22 DIAGNOSIS — Z66 Do not resuscitate: Secondary | ICD-10-CM | POA: Diagnosis present

## 2013-12-22 DIAGNOSIS — F0390 Unspecified dementia without behavioral disturbance: Secondary | ICD-10-CM

## 2013-12-22 DIAGNOSIS — E876 Hypokalemia: Secondary | ICD-10-CM | POA: Diagnosis not present

## 2013-12-22 DIAGNOSIS — I5032 Chronic diastolic (congestive) heart failure: Secondary | ICD-10-CM | POA: Diagnosis present

## 2013-12-22 HISTORY — DX: Unspecified intestinal obstruction, unspecified as to partial versus complete obstruction: K56.609

## 2013-12-22 LAB — CBC WITH DIFFERENTIAL/PLATELET
Basophils Absolute: 0 10*3/uL (ref 0.0–0.1)
Basophils Relative: 0 % (ref 0–1)
EOS ABS: 0.1 10*3/uL (ref 0.0–0.7)
Eosinophils Relative: 0 % (ref 0–5)
HCT: 38.9 % (ref 36.0–46.0)
HEMOGLOBIN: 13 g/dL (ref 12.0–15.0)
Lymphocytes Relative: 8 % — ABNORMAL LOW (ref 12–46)
Lymphs Abs: 1.4 10*3/uL (ref 0.7–4.0)
MCH: 31.6 pg (ref 26.0–34.0)
MCHC: 33.4 g/dL (ref 30.0–36.0)
MCV: 94.4 fL (ref 78.0–100.0)
Monocytes Absolute: 1.5 10*3/uL — ABNORMAL HIGH (ref 0.1–1.0)
Monocytes Relative: 8 % (ref 3–12)
Neutro Abs: 14.6 10*3/uL — ABNORMAL HIGH (ref 1.7–7.7)
Neutrophils Relative %: 84 % — ABNORMAL HIGH (ref 43–77)
Platelets: 542 10*3/uL — ABNORMAL HIGH (ref 150–400)
RBC: 4.12 MIL/uL (ref 3.87–5.11)
RDW: 14.8 % (ref 11.5–15.5)
WBC: 17.5 10*3/uL — ABNORMAL HIGH (ref 4.0–10.5)

## 2013-12-22 LAB — COMPREHENSIVE METABOLIC PANEL
ALT: 12 U/L (ref 0–35)
AST: 16 U/L (ref 0–37)
Albumin: 2.6 g/dL — ABNORMAL LOW (ref 3.5–5.2)
Alkaline Phosphatase: 272 U/L — ABNORMAL HIGH (ref 39–117)
Anion gap: 15 (ref 5–15)
BUN: 29 mg/dL — AB (ref 6–23)
CALCIUM: 9.2 mg/dL (ref 8.4–10.5)
CHLORIDE: 96 meq/L (ref 96–112)
CO2: 23 meq/L (ref 19–32)
CREATININE: 1.41 mg/dL — AB (ref 0.50–1.10)
GFR, EST AFRICAN AMERICAN: 36 mL/min — AB (ref >90)
GFR, EST NON AFRICAN AMERICAN: 31 mL/min — AB (ref >90)
GLUCOSE: 130 mg/dL — AB (ref 70–99)
Potassium: 4 mEq/L (ref 3.7–5.3)
Sodium: 134 mEq/L — ABNORMAL LOW (ref 137–147)
Total Bilirubin: 0.5 mg/dL (ref 0.3–1.2)
Total Protein: 7 g/dL (ref 6.0–8.3)

## 2013-12-22 LAB — URINE MICROSCOPIC-ADD ON

## 2013-12-22 LAB — URINALYSIS, ROUTINE W REFLEX MICROSCOPIC
Bilirubin Urine: NEGATIVE
GLUCOSE, UA: NEGATIVE mg/dL
Hgb urine dipstick: NEGATIVE
Ketones, ur: NEGATIVE mg/dL
Nitrite: NEGATIVE
PH: 5 (ref 5.0–8.0)
Protein, ur: NEGATIVE mg/dL
SPECIFIC GRAVITY, URINE: 1.017 (ref 1.005–1.030)
Urobilinogen, UA: 0.2 mg/dL (ref 0.0–1.0)

## 2013-12-22 LAB — I-STAT TROPONIN, ED: Troponin i, poc: 0.02 ng/mL (ref 0.00–0.08)

## 2013-12-22 LAB — PRO B NATRIURETIC PEPTIDE: PRO B NATRI PEPTIDE: 4519 pg/mL — AB (ref 0–450)

## 2013-12-22 LAB — I-STAT CHEM 8, ED
BUN: 26 mg/dL — ABNORMAL HIGH (ref 6–23)
CHLORIDE: 105 meq/L (ref 96–112)
Calcium, Ion: 1.12 mmol/L — ABNORMAL LOW (ref 1.13–1.30)
Creatinine, Ser: 1.5 mg/dL — ABNORMAL HIGH (ref 0.50–1.10)
GLUCOSE: 116 mg/dL — AB (ref 70–99)
HCT: 43 % (ref 36.0–46.0)
HEMOGLOBIN: 14.6 g/dL (ref 12.0–15.0)
Potassium: 4.2 mEq/L (ref 3.7–5.3)
Sodium: 137 mEq/L (ref 137–147)
TCO2: 21 mmol/L (ref 0–100)

## 2013-12-22 LAB — LIPASE, BLOOD: LIPASE: 43 U/L (ref 11–59)

## 2013-12-22 MED ORDER — IPRATROPIUM-ALBUTEROL 0.5-2.5 (3) MG/3ML IN SOLN: 3.0000 mL | Freq: Four times a day (QID) | RESPIRATORY_TRACT | Status: DC | PRN

## 2013-12-22 MED ORDER — PANTOPRAZOLE SODIUM 40 MG PO TBEC
80.0000 mg | DELAYED_RELEASE_TABLET | Freq: Every day | ORAL | Status: DC
  Administered 2013-12-23: 80 mg via ORAL
  Filled 2013-12-22: qty 2

## 2013-12-22 MED ORDER — AMLODIPINE BESYLATE 10 MG PO TABS
10.0000 mg | ORAL_TABLET | Freq: Every day | ORAL | Status: DC
  Administered 2013-12-23: 10 mg via ORAL
  Filled 2013-12-22: qty 1

## 2013-12-22 MED ORDER — PIPERACILLIN-TAZOBACTAM 3.375 G IVPB 30 MIN
3.3750 g | Freq: Three times a day (TID) | INTRAVENOUS | Status: DC
  Administered 2013-12-23 – 2013-12-26 (×10): 3.375 g via INTRAVENOUS
  Filled 2013-12-22 (×11): qty 50

## 2013-12-22 MED ORDER — TRAMADOL HCL 50 MG PO TABS
50.0000 mg | ORAL_TABLET | Freq: Three times a day (TID) | ORAL | Status: DC
  Administered 2013-12-23: 50 mg via ORAL
  Filled 2013-12-22: qty 1

## 2013-12-22 MED ORDER — ACETAMINOPHEN 650 MG RE SUPP: 650.0000 mg | Freq: Four times a day (QID) | RECTAL | Status: DC | PRN

## 2013-12-22 MED ORDER — GABAPENTIN 100 MG PO CAPS
100.0000 mg | ORAL_CAPSULE | Freq: Three times a day (TID) | ORAL | Status: DC
  Administered 2013-12-23 – 2013-12-28 (×16): 100 mg via ORAL
  Filled 2013-12-22 (×19): qty 1

## 2013-12-22 MED ORDER — ACETAMINOPHEN 325 MG PO TABS
650.0000 mg | ORAL_TABLET | Freq: Four times a day (QID) | ORAL | Status: DC | PRN
  Administered 2013-12-24: 650 mg via ORAL
  Filled 2013-12-22: qty 2

## 2013-12-22 MED ORDER — ONDANSETRON HCL 4 MG PO TABS: 4.0000 mg | ORAL_TABLET | Freq: Four times a day (QID) | ORAL | Status: DC | PRN

## 2013-12-22 MED ORDER — ISOSORBIDE MONONITRATE ER 30 MG PO TB24
30.0000 mg | ORAL_TABLET | Freq: Every day | ORAL | Status: DC
  Administered 2013-12-23 – 2013-12-28 (×6): 30 mg via ORAL
  Filled 2013-12-22 (×6): qty 1

## 2013-12-22 MED ORDER — ONDANSETRON HCL 4 MG/2ML IJ SOLN: 4.0000 mg | Freq: Four times a day (QID) | INTRAMUSCULAR | Status: DC | PRN

## 2013-12-22 MED ORDER — ACYCLOVIR 200 MG PO CAPS
200.0000 mg | ORAL_CAPSULE | Freq: Every day | ORAL | Status: DC
  Administered 2013-12-23 – 2013-12-28 (×6): 200 mg via ORAL
  Filled 2013-12-22 (×6): qty 1

## 2013-12-22 MED ORDER — HEPARIN SODIUM (PORCINE) 5000 UNIT/ML IJ SOLN
5000.0000 [IU] | Freq: Three times a day (TID) | INTRAMUSCULAR | Status: DC
  Administered 2013-12-23 – 2013-12-28 (×15): 5000 [IU] via SUBCUTANEOUS
  Filled 2013-12-22 (×19): qty 1

## 2013-12-22 MED ORDER — VANCOMYCIN HCL IN DEXTROSE 1-5 GM/200ML-% IV SOLN
1000.0000 mg | Freq: Once | INTRAVENOUS | Status: AC
  Administered 2013-12-22: 1000 mg via INTRAVENOUS
  Filled 2013-12-22: qty 200

## 2013-12-22 MED ORDER — CETYLPYRIDINIUM CHLORIDE 0.05 % MT LIQD
7.0000 mL | Freq: Two times a day (BID) | OROMUCOSAL | Status: DC
  Administered 2013-12-23 – 2013-12-27 (×7): 7 mL via OROMUCOSAL

## 2013-12-22 MED ORDER — FUROSEMIDE 10 MG/ML IJ SOLN
40.0000 mg | Freq: Once | INTRAMUSCULAR | Status: AC
  Administered 2013-12-22: 40 mg via INTRAVENOUS
  Filled 2013-12-22: qty 4

## 2013-12-22 MED ORDER — DULOXETINE HCL 30 MG PO CPEP
30.0000 mg | ORAL_CAPSULE | Freq: Every day | ORAL | Status: DC
  Administered 2013-12-23 – 2013-12-28 (×6): 30 mg via ORAL
  Filled 2013-12-22 (×6): qty 1

## 2013-12-22 MED ORDER — TIOTROPIUM BROMIDE MONOHYDRATE 18 MCG IN CAPS
18.0000 ug | ORAL_CAPSULE | Freq: Every day | RESPIRATORY_TRACT | Status: DC
  Administered 2013-12-23 – 2013-12-28 (×6): 18 ug via RESPIRATORY_TRACT
  Filled 2013-12-22 (×2): qty 5

## 2013-12-22 MED ORDER — DEXTROSE 5 % IV SOLN
1.0000 g | Freq: Once | INTRAVENOUS | Status: AC
  Administered 2013-12-22: 1 g via INTRAVENOUS
  Filled 2013-12-22: qty 1

## 2013-12-22 MED ORDER — SENNOSIDES-DOCUSATE SODIUM 8.6-50 MG PO TABS
1.0000 | ORAL_TABLET | Freq: Two times a day (BID) | ORAL | Status: DC
  Administered 2013-12-23 – 2013-12-28 (×11): 1 via ORAL
  Filled 2013-12-22 (×14): qty 1

## 2013-12-22 MED ORDER — VANCOMYCIN HCL IN DEXTROSE 750-5 MG/150ML-% IV SOLN
750.0000 mg | INTRAVENOUS | Status: DC
  Filled 2013-12-22: qty 150

## 2013-12-22 MED ORDER — LEVOTHYROXINE SODIUM 25 MCG PO TABS
25.0000 ug | ORAL_TABLET | Freq: Every day | ORAL | Status: DC
  Administered 2013-12-23: 25 ug via ORAL
  Filled 2013-12-22 (×2): qty 1

## 2013-12-22 MED ORDER — NEBIVOLOL HCL 10 MG PO TABS
10.0000 mg | ORAL_TABLET | Freq: Every day | ORAL | Status: DC
  Administered 2013-12-23: 10 mg via ORAL
  Filled 2013-12-22: qty 1

## 2013-12-22 MED ORDER — MEMANTINE HCL ER 7 MG PO CP24
14.0000 mg | ORAL_CAPSULE | Freq: Every day | ORAL | Status: DC
  Administered 2013-12-23: 14 mg via ORAL
  Filled 2013-12-22: qty 2

## 2013-12-22 MED ORDER — CALCIUM CARBONATE-VITAMIN D 500-200 MG-UNIT PO TABS
1.0000 | ORAL_TABLET | Freq: Every day | ORAL | Status: DC
  Administered 2013-12-23 – 2013-12-28 (×6): 1 via ORAL
  Filled 2013-12-22 (×6): qty 1

## 2013-12-22 MED ORDER — CHLORHEXIDINE GLUCONATE 0.12 % MT SOLN
15.0000 mL | Freq: Two times a day (BID) | OROMUCOSAL | Status: DC
  Administered 2013-12-23 – 2013-12-27 (×11): 15 mL via OROMUCOSAL
  Filled 2013-12-22 (×14): qty 15

## 2013-12-22 MED ORDER — MOMETASONE FURO-FORMOTEROL FUM 100-5 MCG/ACT IN AERO
2.0000 | INHALATION_SPRAY | Freq: Two times a day (BID) | RESPIRATORY_TRACT | Status: DC
  Administered 2013-12-23 – 2013-12-28 (×10): 2 via RESPIRATORY_TRACT
  Filled 2013-12-22: qty 8.8

## 2013-12-22 NOTE — H&P (Signed)
History and Physical  [name] [name] ZOX:[id number] DOB: 21-Oct-1917 DOA: 12/22/2013   PCP: Kimber Relic, MD   Chief Complaint: Lethargy, altered mental status  HPI:  80 year old female with a history of dementia, chronic diastolic CHF, COPD, hypothyroidism, hypertension, CAD presents with reported lethargy. Due to the patient's dementia, the patient is unable to provide any significant history. All of this history is obtained from review of the medical records and speaking with Fayrene Helper, PA-C. Apparently, the patient was recently treated for pneumonia at her nursing facility with Levaquin. In emergency department, the patient to a pain that "my stomach feels terrible. I can't keep anything in my stomach." She denied any headache, chest pain, He complained of some shortness of breath. She is unable to, however she has been short of breath or provide any other significant history.  The patient had 2 episodes of emesis in the emergency department. Attempt was made to contact the patient's family without success.  In emergency department, WBC was noted to be 17.5. Urinalysis showed 7-10 WBCs. ProBNP was 4519. EKG shows sinus rhythm with nonspecific T-wave changes. Chest x-ray showed left lobe opacity suggestive of left pleural effusion. The patient was hemodynamically stable without any respiratory distress. Oxygen saturation was 96% on room air. Point of care creatinine was 1.50 Assessment/Plan: Dyspnea with left pleural effusion -Clinical picture is not totally clear at this time -The patient does not appear to be floridly fluid overloaded despite proBNP of 4519. -Her elevated proBNP may be due to her worsening renal dysfunction -There is no JVD on exam, and the patient does not appear to be fluid clinically overloaded -Given the patient's vomiting and leukocytosis--I will initiate IV vanco and zosyn for tx of HCAP/aspiration pneumonitis -she is at risk for aspiration given her  vomiting -CT of chest to clarify CXR findings -Patient received one dose of intravenous furosemide in the emergency department, but I will hold on ordering any further furosemide at this time pending clinical reevaluation in the next 24 hours Nausea and vomiting -SNF notes pt with hx of vomiting recently -LFTs -lipase -2view abdominal xray Hypertension -Continue amlodipine, bystolic -hold losartan 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 Hypertension   . COPD (chronic obstructive pulmonary disease)   . Chronic diastolic CHF (congestive heart failure)   . Coronary artery disease   . Renal disorder   . GERD (gastroesophageal reflux disease)   . Osteoporosis   . Syncope   . Dementia   . Anemia   . Parkinson disease   . Neuropathy   . Thyroid disease   . Hyperlipidemia   . Anxiety   . Depression   . 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 UTI (lower urinary tract infection) 02/07/2013  . Unspecified hypothyroidism 08/25/2012    10/21/13 TSH 2.772   . Unspecified constipation 10/19/2013    10/18/13 Senokot S bid.    Marland Kitchen Unspecified chronic bronchitis 10/16/2012  . Herpes genitalis in women 10/16/2012  Past Surgical History  Procedure Laterality Date  . Fracture surgery    . Esophagogastroduodenoscopy  05/05/2012    Procedure: ESOPHAGOGASTRODUODENOSCOPY (EGD);  Surgeon: [name], MD;  Location: [facility] ENDOSCOPY;  Service:  Endoscopy;  Laterality: N/A;  Pediatric upper endoscope   Social History:  reports that she has never smoked. She does not have any smokeless tobacco history on file. She reports that she does not drink alcohol or use illicit drugs.   History reviewed. No pertinent family history.   No Known Allergies    Prior to Admission medications   Medication Sig Start Date End Date Taking? Authorizing Provider  acetaminophen (TYLENOL) 325 MG tablet Take 650 mg by mouth every 8 (eight) hours.    Yes Historical Provider, MD  acyclovir (ZOVIRAX) 400 MG tablet Take 200 mg by mouth daily.   Yes Historical Provider, MD  amLODipine (NORVASC) 10 MG tablet Take 10 mg by mouth daily.   Yes Historical Provider, MD  amoxicillin (AMOXIL) 500 MG capsule Take 4 capsules by mouth prior to dental procedure as directed by the access dental care staff   Yes Historical Provider, MD  ARTIFICIAL TEAR OP Place 1-2 drops into both eyes 4 (four) times daily.   Yes Historical Provider, MD  calcium-vitamin D (OSCAL WITH D) 500-200 MG-UNIT per tablet Take 1 tablet by mouth daily.   Yes Historical Provider, MD  celecoxib (CELEBREX) 200 MG capsule Take 200 mg by mouth daily.   Yes Historical Provider, MD  DULoxetine (CYMBALTA) 30 MG capsule Take 30 mg by mouth daily.    Yes Historical Provider, MD  Fluticasone-Salmeterol (ADVAIR) 100-50 MCG/DOSE AEPB Inhale 1 puff into the lungs every 12 (twelve) hours.   Yes Historical Provider, MD  furosemide (LASIX) 20 MG tablet Take 20 mg by mouth daily.   Yes Historical Provider, MD  gabapentin (NEURONTIN) 100 MG capsule Take 100 mg by mouth 3 (three) times daily.   Yes Historical Provider, MD  ipratropium-albuterol (DUONEB) 0.5-2.5 (3) MG/3ML SOLN Take 3 mLs by nebulization every 6 (six) hours as needed (shortness of breath).   Yes Historical Provider, MD  isosorbide mononitrate (IMDUR) 30 MG 24 hr tablet Take 30 mg by mouth daily.   Yes Historical Provider, MD  levofloxacin (LEVAQUIN) 500  MG tablet Take 500 mg by mouth daily. At 5 pm.   Yes Historical Provider, MD  levothyroxine (SYNTHROID, LEVOTHROID) 25 MCG tablet Take 25 mcg by mouth daily.    Yes Historical Provider, MD  losartan (COZAAR) 50 MG tablet Take 50 mg by mouth 2 (two) times daily.   Yes Historical Provider, MD  Memantine HCl ER (NAMENDA XR) 7 MG CP24 Take 14 mg by mouth daily.   Yes Historical Provider, MD  nebivolol (BYSTOLIC) 10 MG tablet Take 1 tablet (10 mg total) by mouth daily. 09/28/13  Yes Man Mast X, NP  omeprazole (PRILOSEC) 40 MG capsule Take 40 mg by mouth 2 (two) times daily.   Yes Historical Provider, MD  potassium chloride (MICRO-K) 10 MEQ CR capsule Take 10 mEq by mouth daily.    Yes Historical Provider, MD  promethazine (PHENERGAN) 12.5 MG suppository Place 12.5 mg rectally every 4 (four) hours as needed for nausea or vomiting.   Yes Historical Provider, MD  saccharomyces boulardii (FLORASTOR) 250 MG capsule Take 250 mg by mouth 2 (two) times daily. At 8 am and 5 pm. 12/14/13 12/24/13 Yes Historical Provider, MD  senna-docusate (SENOKOT S) 8.6-50 MG per tablet Take 1 tablet by mouth 2 (  two) times daily. 10/19/13  Yes Man Mast X, NP  tiotropium (SPIRIVA) 18 MCG inhalation capsule Place 18 mcg into inhaler and inhale daily.   Yes Historical Provider, MD  traMADol (ULTRAM) 50 MG tablet Take 50 mg by mouth every 8 (eight) hours. 1 by mouth three times daily (6 am, 2 pm, 10 pm) 12/05/13  Yes Adeline C Viyuoh, MD    Review of Systems:  Unreliable and unobtainable secondary to patient's mental status  Physical Exam: Filed Vitals:   12/22/13 1711 12/22/13 1755  BP:  159/72  Pulse:  63  Temp:  98.4 F (36.9 C)  TempSrc:  Oral  Resp:  20  SpO2: 96% 95%   General:  Awake and alert, NAD, nontoxic, pleasant/cooperative Head/Eye: No conjunctival hemorrhage, no icterus, [place]/AT, No nystagmus ENT:  No icterus,  No thrush, no pharyngeal exudate Neck:  No masses, no lymphadenpathy CV:  RRR, no rub, no gallop, no  S3 Lung:  Bibasilar crackles, left greater than right. No wheezing. Abdomen: soft/NT, +BS, nondistended, no peritoneal signs Ext: No cyanosis, No rashes, No petechiae, No lymphangitis, trace LE edema   Labs on Admission:  Basic Metabolic Panel:  Recent Labs Lab 12/22/13 1759  NA 137  K 4.2  CL 105  GLUCOSE 116*  BUN 26*  CREATININE 1.50*   Liver Function Tests: No results found for this basename: AST, ALT, ALKPHOS, BILITOT, PROT, ALBUMIN,  in the last 168 hours No results found for this basename: LIPASE, AMYLASE,  in the last 168 hours No results found for this basename: AMMONIA,  in the last 168 hours CBC:  Recent Labs Lab 12/22/13 1745 12/22/13 1759  WBC 17.5*  --   NEUTROABS 14.6*  --   HGB 13.0 14.6  HCT 38.9 43.0  MCV 94.4  --   PLT 542*  --    Cardiac Enzymes: No results found for this basename: CKTOTAL, CKMB, CKMBINDEX, TROPONINI,  in the last 168 hours BNP: No components found with this basename: POCBNP,  CBG: No results found for this basename: GLUCAP,  in the last 168 hours  Radiological Exams on Admission: Dg Chest 2 View  12/22/2013   CLINICAL DATA:  Short of breath, fever, recent pneumonia  EXAM: CHEST  2 VIEW  COMPARISON:  Chest radiograph 12/04/2013  FINDINGS: There cardiac silhouette is enlarged. There is left basilar atelectasis and a moderate left effusion which is increased compared to prior. There is smaller right effusion. No clear infiltrate is identified.  IMPRESSION: Increasing left basilar atelectasis and effusion.   Electronically Signed   By: Genevive Bi M.D.   On: 12/22/2013 18:19   Ct Head Wo Contrast  12/22/2013   CLINICAL DATA:  Progressive weakness and fatigue. Dementia. Anemia. Parkinson's disease.  EXAM: CT HEAD WITHOUT CONTRAST  TECHNIQUE: Contiguous axial images were obtained from the base of the skull through the vertex without intravenous contrast.  COMPARISON:  12/04/2013  FINDINGS: Sinuses/Soft tissues: Surgical changes of  both globes. Fluid in the sphenoid sinus is decreased. Clear mastoid air cells.  Intracranial: Expected cerebral atrophy. Moderate to marked low density in the periventricular white matter likely related to small vessel disease. Bilateral vertebral atherosclerosis. Scattered lacunar infarcts within the basal ganglia and thalami, likely chronic.  No mass lesion, hemorrhage, hydrocephalus, acute infarct, intra-axial, or extra-axial fluid collection.  IMPRESSION: 1.  No acute intracranial abnormality. 2.  Cerebral atrophy and small vessel ischemic change. 3. Sinus disease.   Electronically Signed   By: Hosie Spangle.D.  On: 12/22/2013 18:27    EKG: Independently reviewed.     Time spent:60 minutes Code Status:   DNR carried over from admission July 2015 Family Communication:  No Family at bedside   Adolf Ormiston, DO  [facility] Pager [phone]  If 7PM-7AM, please contact night-coverage [email or web address] Password [email or web address] 12/22/2013, 8:53 PM

## 2013-12-22 NOTE — ED Provider Notes (Signed)
CSN: [id number]     Arrival date & time 12/22/13  1710 History   First MD Initiated Contact with Patient 12/22/13 1720     Chief Complaint  Patient presents with  . Fatigue     (Consider location/radiation/quality/duration/timing/severity/associated sxs/prior Treatment) HPI  80 year old female with hx of dementia, anemia, CHF, CAD, recurrent UTI, COPD, depression brought here via EMS from skilled nursing facility for evaluation of lethargy.  Hx difficult to obtain due to dementia.  Pt report she was diagnosed with pneumonia recently, endorse occasional cough and trouble breathing.  Pt also report having a broken hip and is having difficulty moving, even in wheel chair.  Endorsed chronic She does not know why she is here in the hospital.  Level V caveat applies.   Past Medical History  Diagnosis Date  . Hypertension   . COPD (chronic obstructive pulmonary disease)   . Chronic diastolic CHF (congestive heart failure)   . Coronary artery disease   . Renal disorder   . GERD (gastroesophageal reflux disease)   . Osteoporosis   . Syncope   . Dementia   . Anemia   . Parkinson disease   . Neuropathy   . Thyroid disease   . Hyperlipidemia   . Anxiety   . Depression   . 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 UTI (lower urinary tract infection) 02/07/2013  . Unspecified hypothyroidism 08/25/2012    10/21/13 TSH 2.772   . Unspecified constipation 10/19/2013    10/18/13 Senokot S bid.    Marland Kitchen Unspecified chronic bronchitis 10/16/2012  . Herpes genitalis in women 10/16/2012   Past Surgical History  Procedure Laterality Date  . Fracture surgery    . Esophagogastroduodenoscopy  05/05/2012    Procedure: ESOPHAGOGASTRODUODENOSCOPY (EGD);  Surgeon: [name], MD;  Location: [facility] ENDOSCOPY;  Service: Endoscopy;  Laterality: N/A;  Pediatric upper endoscope   No family history on file. History  Substance Use Topics  . Smoking status: Never  Smoker   . Smokeless tobacco: Not on file  . Alcohol Use: No   OB History   Grav Para Term Preterm Abortions TAB SAB Ect Mult Living                 Review of Systems  Unable to perform ROS: Dementia      Allergies  Review of patient's allergies indicates no known allergies.  Home Medications   Prior to Admission medications   Medication Sig Start Date End Date Taking? Authorizing Provider  acetaminophen (TYLENOL) 325 MG tablet Take 650 mg by mouth every 8 (eight) hours.     Historical Provider, MD  acyclovir (ZOVIRAX) 200 MG capsule Take 200 mg by mouth daily.    Historical Provider, MD  amoxicillin (AMOXIL) 500 MG capsule Take 4 capsules by mouth prior to dental procedure as directed by the access dental care staff    Historical Provider, MD  ARTIFICIAL TEAR OP Place 1-2 drops into both eyes 4 (four) times daily.    Historical Provider, MD  calcium-vitamin D (OSCAL WITH D) 500-200 MG-UNIT per tablet Take 1 tablet by mouth daily.    Historical Provider, MD  celecoxib (CELEBREX) 200 MG capsule Take 200 mg by mouth daily.    Historical Provider, MD  DULoxetine (CYMBALTA) 30 MG capsule Take 30 mg by mouth daily.     Historical Provider, MD  Fluticasone-Salmeterol (ADVAIR) 100-50 MCG/DOSE AEPB Inhale 1 puff into the  lungs every 12 (twelve) hours.    Historical Provider, MD  furosemide (LASIX) 20 MG tablet Take 20 mg by mouth daily.    Historical Provider, MD  gabapentin (NEURONTIN) 100 MG capsule Take 100 mg by mouth 3 (three) times daily.    Historical Provider, MD  ipratropium-albuterol (DUONEB) 0.5-2.5 (3) MG/3ML SOLN Take 3 mLs by nebulization every 6 (six) hours as needed (shortness of breath).    Historical Provider, MD  isosorbide mononitrate (IMDUR) 30 MG 24 hr tablet Take 30 mg by mouth daily.    Historical Provider, MD  levothyroxine (SYNTHROID, LEVOTHROID) 25 MCG tablet Take 25 mcg by mouth daily.     Historical Provider, MD  losartan (COZAAR) 50 MG tablet Take 50 mg by  mouth 2 (two) times daily.    Historical Provider, MD  Memantine HCl ER (NAMENDA XR) 7 MG CP24 Take 14 mg by mouth daily.    Historical Provider, MD  nebivolol (BYSTOLIC) 10 MG tablet Take 1 tablet (10 mg total) by mouth daily. 09/28/13   Man Mast X, NP  omeprazole (PRILOSEC) 40 MG capsule Take 40 mg by mouth 2 (two) times daily.    Historical Provider, MD  potassium chloride (MICRO-K) 10 MEQ CR capsule Take 10 mEq by mouth 2 (two) times daily.    Historical Provider, MD  senna-docusate (SENOKOT S) 8.6-50 MG per tablet Take 1 tablet by mouth 2 (two) times daily. 10/19/13   Man Mast X, NP  tiotropium (SPIRIVA) 18 MCG inhalation capsule Place 18 mcg into inhaler and inhale daily.    Historical Provider, MD  traMADol (ULTRAM) 50 MG tablet Take 50 mg by mouth every 8 (eight) hours. 1 by mouth three times daily (6 am, 12pm, 6 pm, 12 midnight) 12/05/13   Kela Millin, MD   SpO2 96% Physical Exam  Nursing note and vitals reviewed. Constitutional: She appears well-developed and well-nourished. No distress.  HENT:  Head: Atraumatic.  Thrush noted on tongue  Eyes: Conjunctivae and EOM are normal. Pupils are equal, round, and reactive to light.  Neck: Neck supple. No JVD present.  Cardiovascular: Normal rate, regular rhythm and intact distal pulses.   Pulmonary/Chest:  Decreased lung sounds, without obvious W/R/R  Abdominal: Soft. There is no tenderness.  Musculoskeletal:  Difficulty moving lower extremities due to hip pain, but able to move feet on command.    Neurological: She is alert. GCS eye subscore is 4. GCS verbal subscore is 5. GCS motor subscore is 6.  Alert to self and place only.  Speech is slow, slurred, difficult to understand.  Hearing aid in place.    Skin: No rash noted.  Psychiatric: She has a normal mood and affect.    ED Course  Procedures (including critical care time)  5:37 PM Pt sent here due to lethargy and recent diagosed pna.  Work up initiated.    7:25 PM Chest  x-ray demonstrated increasing left basal atelectasis and effusion. No clear infiltrate is identified. She does have an elevated WBC of 17.5 with a left shift. Evidence of renal insufficiency with and 1.5 which is new. Given her increased shortness of breath, cough, and questionable chest x-ray, plan to treat patient for hospital-acquired pneumonia with vancomycin and cefepime.  Pt was taking Levaquin for the past 8 days.  She is currently afebrile, VSS.    Pt had 2 episodes of projectile vomiting while in the ER.  She however denies abd pain or headache. Head CT without acute intracranial abnormalities.  No signs of head trauma on exam.    8:01 PM Pt's BNP is 4519, which is elevated from prior, she has pleral effusion on CXR.  She takes lasix 20mg  PO at home, will give Lasix 40mg  IV here.  CHF exacerbation.  ECG without acute ischemic changes.    8:17 PM I have consulted with [facility], Dr. Arbutus Leasat who agrees to see and admitted pt for further care.    Labs Review Labs Reviewed  CBC WITH DIFFERENTIAL - Abnormal; Notable for the following:    WBC 17.5 (*)    Platelets 542 (*)    Neutrophils Relative % 84 (*)    Neutro Abs 14.6 (*)    Lymphocytes Relative 8 (*)    Monocytes Absolute 1.5 (*)    All other components within normal limits  URINALYSIS, ROUTINE W REFLEX MICROSCOPIC - Abnormal; Notable for the following:    APPearance CLOUDY (*)    Leukocytes, UA MODERATE (*)    All other components within normal limits  PRO B NATRIURETIC PEPTIDE - Abnormal; Notable for the following:    Pro B Natriuretic peptide (BNP) 4519.0 (*)    All other components within normal limits  URINE MICROSCOPIC-ADD ON - Abnormal; Notable for the following:    Bacteria, UA FEW (*)    Casts HYALINE CASTS (*)    All other components within normal limits  I-STAT CHEM 8, ED - Abnormal; Notable for the following:    BUN 26 (*)    Creatinine, Ser 1.50 (*)    Glucose, Bld 116 (*)    Calcium, Ion 1.12 (*)    All  other components within normal limits  OCCULT BLOOD GASTRIC / DUODENUM (SPECIMEN CUP)  Rosezena SensorI-STAT TROPOININ, ED    Imaging Review Dg Chest 2 View  12/22/2013   CLINICAL DATA:  Short of breath, fever, recent pneumonia  EXAM: CHEST  2 VIEW  COMPARISON:  Chest radiograph 12/04/2013  FINDINGS: There cardiac silhouette is enlarged. There is left basilar atelectasis and a moderate left effusion which is increased compared to prior. There is smaller right effusion. No clear infiltrate is identified.  IMPRESSION: Increasing left basilar atelectasis and effusion.   Electronically Signed   By: Genevive BiStewart  Edmunds M.D.   On: 12/22/2013 18:19   Ct Head Wo Contrast  12/22/2013   CLINICAL DATA:  Progressive weakness and fatigue. Dementia. Anemia. Parkinson's disease.  EXAM: CT HEAD WITHOUT CONTRAST  TECHNIQUE: Contiguous axial images were obtained from the base of the skull through the vertex without intravenous contrast.  COMPARISON:  12/04/2013  FINDINGS: Sinuses/Soft tissues: Surgical changes of both globes. Fluid in the sphenoid sinus is decreased. Clear mastoid air cells.  Intracranial: Expected cerebral atrophy. Moderate to marked low density in the periventricular white matter likely related to small vessel disease. Bilateral vertebral atherosclerosis. Scattered lacunar infarcts within the basal ganglia and thalami, likely chronic.  No mass lesion, hemorrhage, hydrocephalus, acute infarct, intra-axial, or extra-axial fluid collection.  IMPRESSION: 1.  No acute intracranial abnormality. 2.  Cerebral atrophy and small vessel ischemic change. 3. Sinus disease.   Electronically Signed   By: Jeronimo GreavesKyle  Talbot M.D.   On: 12/22/2013 18:27     EKG Interpretation   Date/Time:  Wednesday December 22 2013 17:53:58 EDT Ventricular Rate:  62 PR Interval:  137 QRS Duration: 77 QT Interval:  438 QTC Calculation: 445 R Axis:   14 Text Interpretation:  Sinus rhythm Atrial premature complexes Low voltage,  precordial leads  Borderline T abnormalities, lateral leads  Confirmed by  DOCHERTY  MD, MEGAN [phone]) on 12/22/2013 5:59:53 PM      MDM   Final diagnoses:  HCAP (healthcare-associated pneumonia)  Renal insufficiency  Diastolic congestive heart failure, unspecified congestive heart failure chronicity    BP 159/72  Pulse 63  Temp(Src) 98.4 F (36.9 C) (Oral)  Resp 20  SpO2 95%  I have reviewed nursing notes and vital signs. I personally reviewed the imaging tests through PACS system  I reviewed available ER/hospitalization records thought the EMR     Fayrene Helper, [place] 12/22/13 2034

## 2013-12-22 NOTE — ED Notes (Signed)
Per EMS: Pt from [facility].  Staff reports that she is more lethargic than normal.  Pt was dx w/ pna 2 days ago.  Pt denies any complaint.  Pt has dementia but is alert and oriented.

## 2013-12-22 NOTE — Progress Notes (Addendum)
ANTIBIOTIC CONSULT NOTE - INITIAL  Pharmacy Consult for Vancomycin  Indication: pneumonia  No Known Allergies  Patient Measurements:   Total body weight 71.7 kg  Vital Signs: Temp: 98.4 F (36.9 C) (08/12 1755) Temp src: Oral (08/12 1755) BP: 159/72 mmHg (08/12 1755) Pulse Rate: 63 (08/12 1755) Intake/Output from previous day:   Intake/Output from this shift:    Labs:  Recent Labs  12/22/13 1745 12/22/13 1759  WBC 17.5*  --   HGB 13.0 14.6  PLT 542*  --   CREATININE  --  1.50*   The CrCl is unknown because both a height and weight (above a minimum accepted value) are required for this calculation. No results found for this basename: VANCOTROUGH, VANCOPEAK, VANCORANDOM, GENTTROUGH, GENTPEAK, GENTRANDOM, TOBRATROUGH, TOBRAPEAK, TOBRARND, AMIKACINPEAK, AMIKACINTROU, AMIKACIN,  in the last 72 hours   Microbiology: No results found for this or any previous visit (from the past 720 hour(s)).  Medical History: Past Medical History  Diagnosis Date  . Hypertension   . COPD (chronic obstructive pulmonary disease)   . Chronic diastolic CHF (congestive heart failure)   . Coronary artery disease   . Renal disorder   . GERD (gastroesophageal reflux disease)   . Osteoporosis   . Syncope   . Dementia   . Anemia   . Parkinson disease   . Neuropathy   . Thyroid disease   . Hyperlipidemia   . Anxiety   . Depression   . 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 UTI (lower urinary tract infection) 02/07/2013  . Unspecified hypothyroidism 08/25/2012    10/21/13 TSH 2.772   . Unspecified constipation 10/19/2013    10/18/13 Senokot S bid.    Marland Kitchen. Unspecified chronic bronchitis 10/16/2012  . Herpes genitalis in women 10/16/2012   Medications:  Scheduled:   Anti-infectives   Start     Dose/Rate Route Frequency Ordered Stop   12/22/13 1930  ceFEPIme (MAXIPIME) 1 g in dextrose 5 % 50 mL IVPB     1 g 100 mL/hr over 30 Minutes Intravenous   Once 12/22/13 1901       Assessment: 89 yoF admit 8/12 from SNF, diagnosed with PNA, treated with Levaquin at facility, Day 9/10 today. CXray L base effusion, treat with Vancomycin per pharmacy/Cefepime x 1 dose in ED for HCAP.  Recent admission 7/25 to [facility] for hip pain after fall at SNF  Will need Cefepime or Zosyn concurrently with Vancomycin for HCAP treatment  No cultures ordered  Clearance < 30 ml/min  Goal of Therapy:  Vancomycin trough level 15-20 mcg/ml  Plan:   Vancomycin 1000mg  x1, then 750mg  q24hr  Anticipate continued Cefepime or Zosyn per hospitalist  Otho BellowsGreen, Quadir Muns L PharmD Pager [phone] 12/22/2013, 7:46 PM

## 2013-12-22 NOTE — ED Notes (Signed)
Patient can stand up but only take a few steps. Patient has difficulty with the few steps she take.

## 2013-12-22 NOTE — ED Notes (Signed)
Patient transported to X-ray 

## 2013-12-22 NOTE — ED Notes (Addendum)
[facility] to check on [facility]. Aurther Lofterry was told patient is being admitted.

## 2013-12-22 NOTE — ED Notes (Signed)
Patient transported to CT 

## 2013-12-22 NOTE — ED Notes (Signed)
Bed: WA08 Expected date:  Expected time:  Means of arrival:  Comments: EMS 

## 2013-12-23 ENCOUNTER — Inpatient Hospital Stay (HOSPITAL_COMMUNITY): Payer: Medicare Other

## 2013-12-23 DIAGNOSIS — K56609 Unspecified intestinal obstruction, unspecified as to partial versus complete obstruction: Secondary | ICD-10-CM | POA: Diagnosis present

## 2013-12-23 DIAGNOSIS — I5033 Acute on chronic diastolic (congestive) heart failure: Secondary | ICD-10-CM

## 2013-12-23 DIAGNOSIS — G9340 Encephalopathy, unspecified: Secondary | ICD-10-CM | POA: Diagnosis present

## 2013-12-23 DIAGNOSIS — R0609 Other forms of dyspnea: Secondary | ICD-10-CM

## 2013-12-23 DIAGNOSIS — M7989 Other specified soft tissue disorders: Secondary | ICD-10-CM

## 2013-12-23 DIAGNOSIS — B3749 Other urogenital candidiasis: Secondary | ICD-10-CM | POA: Diagnosis present

## 2013-12-23 DIAGNOSIS — M79609 Pain in unspecified limb: Secondary | ICD-10-CM

## 2013-12-23 DIAGNOSIS — R0989 Other specified symptoms and signs involving the circulatory and respiratory systems: Secondary | ICD-10-CM

## 2013-12-23 LAB — BASIC METABOLIC PANEL
ANION GAP: 17 — AB (ref 5–15)
BUN: 30 mg/dL — ABNORMAL HIGH (ref 6–23)
CO2: 23 mEq/L (ref 19–32)
CREATININE: 1.43 mg/dL — AB (ref 0.50–1.10)
Calcium: 9 mg/dL (ref 8.4–10.5)
Chloride: 95 mEq/L — ABNORMAL LOW (ref 96–112)
GFR, EST AFRICAN AMERICAN: 35 mL/min — AB (ref >90)
GFR, EST NON AFRICAN AMERICAN: 30 mL/min — AB (ref >90)
Glucose, Bld: 88 mg/dL (ref 70–99)
Potassium: 3.6 mEq/L — ABNORMAL LOW (ref 3.7–5.3)
SODIUM: 135 meq/L — AB (ref 137–147)

## 2013-12-23 LAB — CBC
HCT: 35.7 % — ABNORMAL LOW (ref 36.0–46.0)
Hemoglobin: 11.9 g/dL — ABNORMAL LOW (ref 12.0–15.0)
MCH: 31.4 pg (ref 26.0–34.0)
MCHC: 33.3 g/dL (ref 30.0–36.0)
MCV: 94.2 fL (ref 78.0–100.0)
PLATELETS: 490 10*3/uL — AB (ref 150–400)
RBC: 3.79 MIL/uL — ABNORMAL LOW (ref 3.87–5.11)
RDW: 14.8 % (ref 11.5–15.5)
WBC: 16.7 10*3/uL — AB (ref 4.0–10.5)

## 2013-12-23 LAB — TROPONIN I
Troponin I: <0.3 ng/mL (ref <0.30)
Troponin I: <0.3 ng/mL (ref <0.30)

## 2013-12-23 MED ORDER — MORPHINE SULFATE 2 MG/ML IJ SOLN
1.0000 mg | INTRAMUSCULAR | Status: DC | PRN
  Administered 2013-12-24: 2 mg via INTRAVENOUS
  Filled 2013-12-23: qty 1

## 2013-12-23 MED ORDER — METOPROLOL TARTRATE 1 MG/ML IV SOLN
2.5000 mg | Freq: Three times a day (TID) | INTRAVENOUS | Status: DC
  Administered 2013-12-23 – 2013-12-28 (×14): 2.5 mg via INTRAVENOUS
  Filled 2013-12-23 (×19): qty 5

## 2013-12-23 MED ORDER — LEVOTHYROXINE SODIUM 100 MCG IV SOLR
12.5000 ug | Freq: Every day | INTRAVENOUS | Status: DC
  Administered 2013-12-24 – 2013-12-26 (×3): 12.5 ug via INTRAVENOUS
  Filled 2013-12-23 (×3): qty 5

## 2013-12-23 MED ORDER — METOPROLOL TARTRATE 1 MG/ML IV SOLN: 5.0000 mg | Freq: Three times a day (TID) | INTRAVENOUS | Status: DC

## 2013-12-23 MED ORDER — SODIUM CHLORIDE 0.9 % IV SOLN
80.0000 mg | INTRAVENOUS | Status: DC
  Administered 2013-12-23 – 2013-12-27 (×5): 80 mg via INTRAVENOUS
  Filled 2013-12-23 (×8): qty 80

## 2013-12-23 MED ORDER — FUROSEMIDE 10 MG/ML IJ SOLN
20.0000 mg | Freq: Two times a day (BID) | INTRAMUSCULAR | Status: DC
  Administered 2013-12-24 – 2013-12-25 (×4): 20 mg via INTRAVENOUS
  Filled 2013-12-23 (×8): qty 2

## 2013-12-23 MED ORDER — KETOROLAC TROMETHAMINE 15 MG/ML IJ SOLN: 15.0000 mg | Freq: Four times a day (QID) | INTRAMUSCULAR | Status: DC | PRN

## 2013-12-23 NOTE — Progress Notes (Signed)
[facility] PROGRESS NOTE  Donica O Codd [id number] DOB: [birth year] DOA: 12/22/2013 PCP: Kimber RelicGREEN, ARTHUR G, MD  Assessment/Plan: #1 probable partial small bowel obstruction Abdominal x-rays. Patient had presented with complaints of abdominal pain and inability to keep food down. Patient denies any nausea or emesis. No bowel movement noted. Will place patient on bowel rest, supportive care,antiemetics, pain management. Serial abdominal films. Mobilize.  #2 dyspnea with left pleural effusion/probable acute on chronic Diastolic heart failure. Patient was noted to have elevated proBNP of 4519 on admission. Patient noted with some crackles on examination. Cardiac enzymes negative x3. 2-D echo pending. CT of the chest with bilateral effusions and associated bibasilar atelectasis. Patchy groundglass opacities in both lungs suggesting mild edema. No consultation no suspicious pulmonary nodule. Mild cardiomegaly and atherosclerosis. Patient likely does not have a pneumonia. Will discontinue IV vancomycin. Place on IV diuretics. Strict is and os. Daily weights.  #3 UTI Check a urine culture. Continue IV Zosyn.  #4 leukocytosis Likely secondary to problem #3. Urine cultures pending. Continue empiric IV Zosyn. Follow.  #5 dementia Was tolerating oral intake will resume home regimen of Namenda.  #6 acute on chronic kidney disease stage II to III Baseline creatinine was 0.9-1.0. Check a urine sodium. Check a urine creatinine. Will place on IV diuretics and follow.  #7 nausea and emesis Likely secondary to problem number of 1.  #8 COPD Stable. Continue Spiriva and alert.  #9 hypertension Will place on IV Lopressor as of the place patient on bowel rest.  #10 coronary artery disease Stable. Continue him to. Cardiac enzymes negative x3. 2-D echo pending. Will place on IV Lopressor.  #11 depression Continue Cymbalta.  #12 acute encephalopathy Likely secondary to urinary tract infection in  the setting of dementia. Urine cultures are pending. Continue empiric antibiotics. Follow.  #13 prophylaxis Protonix for GI prophylaxis. Heparin for DVT prophylaxis.  Code Status: DO NOT RESUSCITATE Family Communication: Updated patient and sister at bedside. Disposition Plan: Back to skilled nursing facility when medically stable.   Consultants:  None  Procedures:  Abdominal series 12/23/2013  CT chest 12/23/2013  Antibiotics:  IV vancomycin 12/22/2013>>>> 12/23/2013  IV Zosyn 12/22/2013  HPI/Subjective: Patient alert and following commands. Patient pleasantly confused.  Objective: Filed Vitals:   12/23/13 1400  BP: 138/86  Pulse: 60  Temp: 97.4 F (36.3 C)  Resp: 18   No intake or output data in the 24 hours ending 12/23/13 1639 Filed Weights   12/22/13 2224  Weight: 68.1 kg (150 lb 2.1 oz)    Exam:   General:  NAD  Cardiovascular: RRR  Respiratory: Some diffuse crackles.  Abdomen: Soft, nontender, nondistended, positive bowel sounds.  Musculoskeletal: No clubbing cyanosis or edema  Data Reviewed: Basic Metabolic Panel:  Recent Labs Lab 12/22/13 1759 12/22/13 2022 12/23/13 0528  NA 137 134* 135*  K 4.2 4.0 3.6*  CL 105 96 95*  CO2  --  23 23  GLUCOSE 116* 130* 88  BUN 26* 29* 30*  CREATININE 1.50* 1.41* 1.43*  CALCIUM  --  9.2 9.0   Liver Function Tests:  Recent Labs Lab 12/22/13 2022  AST 16  ALT 12  ALKPHOS 272*  BILITOT 0.5  PROT 7.0  ALBUMIN 2.6*    Recent Labs Lab 12/22/13 1858  LIPASE 43   No results found for this basename: AMMONIA,  in the last 168 hours CBC:  Recent Labs Lab 12/22/13 1745 12/22/13 1759 12/23/13 0528  WBC 17.5*  --  16.7*  NEUTROABS 14.6*  --   --  HGB 13.0 14.6 11.9*  HCT 38.9 43.0 35.7*  MCV 94.4  --  94.2  PLT 542*  --  490*   Cardiac Enzymes:  Recent Labs Lab 12/22/13 2337 12/23/13 0525 12/23/13 1120  TROPONINI <0.30 <0.30 <0.30   BNP (last 3 results)  Recent Labs   12/22/13 1858  PROBNP 4519.0*   CBG: No results found for this basename: GLUCAP,  in the last 168 hours  No results found for this or any previous visit (from the past 240 hour(s)).   Studies: Dg Chest 2 View  12/22/2013   CLINICAL DATA:  Short of breath, fever, recent pneumonia  EXAM: CHEST  2 VIEW  COMPARISON:  Chest radiograph 12/04/2013  FINDINGS: There cardiac silhouette is enlarged. There is left basilar atelectasis and a moderate left effusion which is increased compared to prior. There is smaller right effusion. No clear infiltrate is identified.  IMPRESSION: Increasing left basilar atelectasis and effusion.   Electronically Signed   By: Genevive Bi M.D.   On: 12/22/2013 18:19   Ct Head Wo Contrast  12/22/2013   CLINICAL DATA:  Progressive weakness and fatigue. Dementia. Anemia. Parkinson's disease.  EXAM: CT HEAD WITHOUT CONTRAST  TECHNIQUE: Contiguous axial images were obtained from the base of the skull through the vertex without intravenous contrast.  COMPARISON:  12/04/2013  FINDINGS: Sinuses/Soft tissues: Surgical changes of both globes. Fluid in the sphenoid sinus is decreased. Clear mastoid air cells.  Intracranial: Expected cerebral atrophy. Moderate to marked low density in the periventricular white matter likely related to small vessel disease. Bilateral vertebral atherosclerosis. Scattered lacunar infarcts within the basal ganglia and thalami, likely chronic.  No mass lesion, hemorrhage, hydrocephalus, acute infarct, intra-axial, or extra-axial fluid collection.  IMPRESSION: 1.  No acute intracranial abnormality. 2.  Cerebral atrophy and small vessel ischemic change. 3. Sinus disease.   Electronically Signed   By: Jeronimo Greaves M.D.   On: 12/22/2013 18:27   Ct Chest Wo Contrast  12/23/2013   CLINICAL DATA:  80 year old with shortness of breath and pleural effusions. History of chronic diastolic congestive heart failure, COPD and hypothyroidism.  EXAM: CT CHEST WITHOUT  CONTRAST  TECHNIQUE: Multidetector CT imaging of the chest was performed following the standard protocol without IV contrast.  COMPARISON:  Radiographs 12/22/2013.  CT 03/13/2006.  FINDINGS: Mediastinum: There are no enlarged mediastinal, hilar or axillary lymph nodes. The thyroid gland appears normal. Tracheobronchial calcifications are noted. There is a small hiatal hernia. The heart is mildly enlarged.There is mild atherosclerosis of the aorta, great vessels and coronary arteries.  Lungs/Pleura: There are small dependent bilateral pleural effusions. There is no pericardial effusion. Dependent opacities at both lung bases are consistent with atelectasis. There are patchy ground-glass opacities with an upper lobe predominance, possibly reflecting edema. There is no consolidation or suspicious pulmonary nodule.  Upper abdomen: There is an enlarging cyst in the upper pole of the right kidney, measuring 4.4 cm on image 49. The gallbladder is surgically absent. There is no adrenal mass.  Musculoskeletal/Chest wall: No chest wall masses or suspicious osseous findings demonstrated.  IMPRESSION: 1. Bilateral pleural effusions with associated bibasilar atelectasis. 2. Patchy ground-glass opacities in both lungs suggesting mild edema. No consolidation or suspicious pulmonary nodule. 3. Mild cardiomegaly and atherosclerosis.   Electronically Signed   By: Roxy Horseman M.D.   On: 12/23/2013 09:49   Dg Abd 2 Views  12/23/2013   CLINICAL DATA:  Abdominal pain  EXAM: ABDOMEN - 2 VIEW  COMPARISON:  None.  FINDINGS: Scattered large and small bowel gas is noted. Differential air-fluid levels are seen with mild small bowel dilatation. No definitive free air is seen. Degenerative changes of the lumbar spine are noted. Postsurgical changes are seen in both hips. Chronic changes of proximal left femur are noted.  IMPRESSION: Mild small bowel dilatation likely indicating a partial small bowel obstruction. Correlation with physical  exam is recommended.   Electronically Signed   By: Alcide Clever M.D.   On: 12/23/2013 09:45    Scheduled Meds: . acyclovir  200 mg Oral Daily  . antiseptic oral rinse  7 mL Mouth Rinse q12n4p  . calcium-vitamin D  1 tablet Oral Daily  . chlorhexidine  15 mL Mouth Rinse BID  . DULoxetine  30 mg Oral Daily  . gabapentin  100 mg Oral TID  . heparin  5,000 Units Subcutaneous 3 times per day  . isosorbide mononitrate  30 mg Oral Daily  . [START ON 12/24/2013] levothyroxine  12.5 mcg Intravenous Daily  . metoprolol  2.5 mg Intravenous 3 times per day  . mometasone-formoterol  2 puff Inhalation BID  . pantoprazole (PROTONIX) IV  80 mg Intravenous Q24H  . piperacillin-tazobactam  3.375 g Intravenous Q8H  . senna-docusate  1 tablet Oral BID  . tiotropium  18 mcg Inhalation Daily  . vancomycin  750 mg Intravenous Q24H   Continuous Infusions:   Principal Problem:   SBO (small bowel obstruction) Active Problems:   Acute on chronic diastolic heart failure   Chronic diastolic CHF (congestive heart failure)   HCAP (healthcare-associated pneumonia)   UTI (urinary tract infection)   Acute encephalopathy    Time spent: 35 mins    [facility] MD [facility] Pager [phone]. If 7PM-7AM, please contact night-coverage at [email or web address], password [facility] 12/23/2013, 4:39 PM  LOS: 1 day

## 2013-12-23 NOTE — Progress Notes (Signed)
INITIAL NUTRITION ASSESSMENT  DOCUMENTATION CODES Per approved criteria  -Not Applicable   INTERVENTION: - Diet advancement per MD - RD to continue to monitor   NUTRITION DIAGNOSIS: Inadequate oral intake related to inability to eat as evidenced by NPO.   Goal: Advance diet as tolerated to regular diet  Monitor:  Weights, labs, diet advancement  Reason for Assessment: Malnutrition screening tool   80 y.o. female  Admitting Dx: SBO (small bowel obstruction)  ASSESSMENT: Pt with history of dementia, chronic diastolic CHF, COPD, hypothyroidism, hypertension, CAD presents with reported lethargy. In the ED pt c/o stomach pain. Pt alone in room, appears well nourished.   - Per telephone conversation with [facility] staff, pt was on a regular diet with 25-50% meal intake, diet consisted mostly of sandwiches and milk - Not on any nutritional supplements PTA - Staff said pt's weigh has been stable - Said that pt usually requires a lot of encouragement to eat, even more so when she's not feeling well   Potassium slightly low Alk phos elevated    Height: Ht Readings from Last 1 Encounters:  12/22/13 $RemoveB'5\' 2"'PFdmuzpB$  (1.575 m)    Weight: Wt Readings from Last 1 Encounters:  12/22/13 150 lb 2.1 oz (68.1 kg)    Ideal Body Weight: 110 lbs   % Ideal Body Weight: 136%  Wt Readings from Last 10 Encounters:  12/22/13 150 lb 2.1 oz (68.1 kg)  12/09/13 158 lb (71.668 kg)  12/04/13 158 lb (71.668 kg)  01/06/13 158 lb 8 oz (71.895 kg)  05/07/12 160 lb 7.9 oz (72.8 kg)  05/07/12 160 lb 7.9 oz (72.8 kg)  11/05/11 180 lb 8.9 oz (81.9 kg)    Usual Body Weight: 158 lbs last month  % Usual Body Weight: 95%  BMI:  Body mass index is 27.45 kg/(m^2).  Estimated Nutritional Needs: Kcal: 1200-1400 Protein: 60-70g Fluid: per MD  Skin: +1 RLE, LLE edema  Diet Order: NPO  EDUCATION NEEDS: -No education needs identified at this time  No intake or output data in the 24 hours  ending 12/23/13 1627  Last BM: PTA  Labs:   Recent Labs Lab 12/22/13 1759 12/22/13 2022 12/23/13 0528  NA 137 134* 135*  K 4.2 4.0 3.6*  CL 105 96 95*  CO2  --  23 23  BUN 26* 29* 30*  CREATININE 1.50* 1.41* 1.43*  CALCIUM  --  9.2 9.0  GLUCOSE 116* 130* 88    CBG (last 3)  No results found for this basename: GLUCAP,  in the last 72 hours  Scheduled Meds: . acyclovir  200 mg Oral Daily  . antiseptic oral rinse  7 mL Mouth Rinse q12n4p  . calcium-vitamin D  1 tablet Oral Daily  . chlorhexidine  15 mL Mouth Rinse BID  . DULoxetine  30 mg Oral Daily  . gabapentin  100 mg Oral TID  . heparin  5,000 Units Subcutaneous 3 times per day  . isosorbide mononitrate  30 mg Oral Daily  . [START ON 12/24/2013] levothyroxine  12.5 mcg Intravenous Daily  . metoprolol  2.5 mg Intravenous 3 times per day  . mometasone-formoterol  2 puff Inhalation BID  . pantoprazole (PROTONIX) IV  80 mg Intravenous Q24H  . piperacillin-tazobactam  3.375 g Intravenous Q8H  . senna-docusate  1 tablet Oral BID  . tiotropium  18 mcg Inhalation Daily  . vancomycin  750 mg Intravenous Q24H    Continuous Infusions:   Past Medical History  Diagnosis Date  .  Hypertension   . COPD (chronic obstructive pulmonary disease)   . Chronic diastolic CHF (congestive heart failure)   . Coronary artery disease   . Renal disorder   . GERD (gastroesophageal reflux disease)   . Osteoporosis   . Syncope   . Dementia   . Anemia   . Parkinson disease   . Neuropathy   . Thyroid disease   . Hyperlipidemia   . Anxiety   . Depression   . 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 UTI (lower urinary tract infection) 02/07/2013  . Unspecified hypothyroidism 08/25/2012    10/21/13 TSH 2.772   . Unspecified constipation 10/19/2013    10/18/13 Senokot S bid.    Marland Kitchen Unspecified chronic bronchitis 10/16/2012  . Herpes genitalis in women 10/16/2012    Past Surgical History   Procedure Laterality Date  . Fracture surgery    . Esophagogastroduodenoscopy  05/05/2012    Procedure: ESOPHAGOGASTRODUODENOSCOPY (EGD);  Surgeon: Cleotis Nipper, MD;  Location: [facility] ENDOSCOPY;  Service: Endoscopy;  Laterality: N/A;  Pediatric upper endoscope    Carlis Stable MS, [place], LDN [phone] Pager [phone] Weekend/After Hours Pager

## 2013-12-23 NOTE — ED Provider Notes (Signed)
Medical screening examination/treatment/procedure(s) were conducted as a shared visit with non-physician practitioner(s) and myself.  I personally evaluated the patient during the encounter. Pt from SNF with worsening fatigue since starting tx for PNA. Pt somewhat confused, sleepy appearing, tachypneic, but in NAD. CXR w/ worsening atelectasis & effusion. WBC 17.5. She also has AKI. Given she has been on 8d Levaquin, will start broad spectrum vanc/cefepime and admit to [facility].   EKG Interpretation   Date/Time:  Wednesday December 22 2013 17:53:58 EDT Ventricular Rate:  62 PR Interval:  137 QRS Duration: 77 QT Interval:  438 QTC Calculation: 445 R Axis:   14 Text Interpretation:  Sinus rhythm Atrial premature complexes Low voltage,  precordial leads Borderline T abnormalities, lateral leads Confirmed by  DOCHERTY  MD, MEGAN ([id number]) on 12/22/2013 5:59:53 PM        Toy CookeyMegan Docherty, MD 12/23/13 0005

## 2013-12-23 NOTE — Progress Notes (Signed)
Clinical Social Work Department BRIEF PSYCHOSOCIAL ASSESSMENT 12/23/2013  Patient:  [name] [name],[name] [name]     Account Number:  [id number]     Admit date:  12/22/2013  Clinical Social Worker:  Dennison BullaGERBER,Kaid Seeberger, LCSW  Date/Time:  12/23/2013 02:30 PM  Referred by:  Physician  Date Referred:  12/23/2013 Referred for  SNF Placement   Other Referral:   Interview type:  Family Other interview type:    PSYCHOSOCIAL DATA Living Status:  FACILITY Admitted from facility:  [facility] Level of care:  Skilled Nursing Facility Primary support name:  [name] [name] Primary support relationship to patient:  CHILD, ADULT Degree of support available:   [place]ron    CURRENT CONCERNS Current Concerns  Post-Acute Placement   Other Concerns:    SOCIAL WORK ASSESSMENT / PLAN CSW received referral in order to assist with DC planning. CSW reviewed chart and attempted to meet with patient but patient is unable to fully participate in assessment. CSW called and spoke with son [name] Oka(Dan) via phone.    Son reports that [facility] and plans to return at DC. Patient is in skilled nursing and needs that level at DC. Son asked CSW to call his brother [name] Romney(Doug) to explain plans as well. CSW called and left a message with [name] [name] with CSW contact information.    CSW completed FL2 and spoke with SNF who is agreeable to accept when medically stable.   Assessment/plan status:  Psychosocial Support/Ongoing Assessment of Needs Other assessment/ plan:   Information/referral to community resources:   Will return to SNF    PATIENT'S/FAMILY'S RESPONSE TO PLAN OF CARE: Patient unable to participate in assessment due to confusion. Patient's son is involved and reports he is happy that patient lives in a community that provides good care for her. Son reports he and brother care for patient and they are both involved in care. Son thanked CSW for call and appreciative of assistance.       [place] Rapids[place]olly Kaylise Blakeley, [place]  [phone]

## 2013-12-23 NOTE — Progress Notes (Signed)
*  Preliminary Results* Bilateral lower extremity venous duplex completed. Visualized veins of bilateral lower extremities are negative for deep vein thrombosis. There is no evidence of Baker's cyst bilaterally.  12/23/2013  Gertie FeyMichelle Hamda Klutts, RVT, RDCS, RDMS

## 2013-12-24 ENCOUNTER — Inpatient Hospital Stay (HOSPITAL_COMMUNITY): Payer: Medicare Other

## 2013-12-24 DIAGNOSIS — R143 Flatulence: Secondary | ICD-10-CM

## 2013-12-24 DIAGNOSIS — I369 Nonrheumatic tricuspid valve disorder, unspecified: Secondary | ICD-10-CM

## 2013-12-24 DIAGNOSIS — R112 Nausea with vomiting, unspecified: Secondary | ICD-10-CM

## 2013-12-24 DIAGNOSIS — R142 Eructation: Secondary | ICD-10-CM

## 2013-12-24 DIAGNOSIS — R141 Gas pain: Secondary | ICD-10-CM

## 2013-12-24 DIAGNOSIS — R109 Unspecified abdominal pain: Secondary | ICD-10-CM

## 2013-12-24 LAB — BASIC METABOLIC PANEL
ANION GAP: 17 — AB (ref 5–15)
BUN: 26 mg/dL — ABNORMAL HIGH (ref 6–23)
CALCIUM: 8.7 mg/dL (ref 8.4–10.5)
CO2: 24 meq/L (ref 19–32)
Chloride: 99 mEq/L (ref 96–112)
Creatinine, Ser: 1.2 mg/dL — ABNORMAL HIGH (ref 0.50–1.10)
GFR calc Af Amer: 43 mL/min — ABNORMAL LOW (ref >90)
GFR calc non Af Amer: 37 mL/min — ABNORMAL LOW (ref >90)
Glucose, Bld: 66 mg/dL — ABNORMAL LOW (ref 70–99)
POTASSIUM: 3.1 meq/L — AB (ref 3.7–5.3)
SODIUM: 140 meq/L (ref 137–147)

## 2013-12-24 LAB — CBC
HEMATOCRIT: 36.9 % (ref 36.0–46.0)
Hemoglobin: 11.9 g/dL — ABNORMAL LOW (ref 12.0–15.0)
MCH: 30.8 pg (ref 26.0–34.0)
MCHC: 32.2 g/dL (ref 30.0–36.0)
MCV: 95.6 fL (ref 78.0–100.0)
PLATELETS: 503 10*3/uL — AB (ref 150–400)
RBC: 3.86 MIL/uL — AB (ref 3.87–5.11)
RDW: 14.7 % (ref 11.5–15.5)
WBC: 12.4 10*3/uL — AB (ref 4.0–10.5)

## 2013-12-24 LAB — MAGNESIUM: Magnesium: 2 mg/dL (ref 1.5–2.5)

## 2013-12-24 LAB — PRO B NATRIURETIC PEPTIDE: Pro B Natriuretic peptide (BNP): 2064 pg/mL — ABNORMAL HIGH (ref 0–450)

## 2013-12-24 MED ORDER — POTASSIUM CHLORIDE 10 MEQ/100ML IV SOLN
10.0000 meq | INTRAVENOUS | Status: DC
  Administered 2013-12-24: 10 meq via INTRAVENOUS
  Filled 2013-12-24 (×5): qty 100

## 2013-12-24 MED ORDER — POTASSIUM CHLORIDE CRYS ER 20 MEQ PO TBCR
40.0000 meq | EXTENDED_RELEASE_TABLET | ORAL | Status: AC
  Administered 2013-12-24 (×2): 40 meq via ORAL
  Filled 2013-12-24 (×2): qty 2

## 2013-12-24 MED ORDER — POTASSIUM CHLORIDE CRYS ER 20 MEQ PO TBCR
40.0000 meq | EXTENDED_RELEASE_TABLET | ORAL | Status: DC
  Filled 2013-12-24 (×2): qty 2

## 2013-12-24 NOTE — Consult Note (Signed)
[facility] Surgery Consult Note  [name] [name] 09/03/1917  [id number].    Requesting MD: Dr. Grandville Silos Chief Complaint/Reason for Consult: SBO  HPI:  80 year old female with a history of dementia, chronic diastolic CHF, COPD, hypothyroidism, hypertension, CAD presents with reported lethargy. Due to the patient's dementia, the patient is unable to provide any significant history. All of this history is obtained from review of the medical records, multiple medical providers, and the patients brother Rush Landmark), sister Mechele Claude) and son Marden Noble). Apparently, the patient was recently treated for pneumonia at her nursing facility with Levaquin. In the ED she complained on nausea, vomiting, and abdominal pain.  Noted to have some diarrhea per report.  She complained of some shortness of breath. The patient had 2 episodes of emesis in the ED.  The family thinks this must have started sometime between Saturday and Thursday, but have not visited her recently.  She and the family are unable to give me further details in her history.  H/o cholecystectomy and hysterectomy.   ROS: All systems reviewed and otherwise negative except for as above  History reviewed. No pertinent family history.  Past Medical History  Diagnosis Date  . Hypertension   . COPD (chronic obstructive pulmonary disease)   . Chronic diastolic CHF (congestive heart failure)   . Coronary artery disease   . Renal disorder   . GERD (gastroesophageal reflux disease)   . Osteoporosis   . Syncope   . Dementia   . Anemia   . Parkinson disease   . Neuropathy   . Thyroid disease   . Hyperlipidemia   . Anxiety   . Depression   . 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 UTI (lower urinary tract infection) 02/07/2013  . Unspecified hypothyroidism 08/25/2012    10/21/13 TSH 2.772   . Unspecified constipation 10/19/2013    10/18/13 Senokot S bid.    Marland Kitchen Unspecified chronic bronchitis 10/16/2012   . Herpes genitalis in women 10/16/2012    Past Surgical History  Procedure Laterality Date  . Fracture surgery    . Esophagogastroduodenoscopy  05/05/2012    Procedure: ESOPHAGOGASTRODUODENOSCOPY (EGD);  Surgeon: Cleotis Nipper, MD;  Location: [facility] ENDOSCOPY;  Service: Endoscopy;  Laterality: N/A;  Pediatric upper endoscope    Social History:  reports that she has never smoked. She does not have any smokeless tobacco history on file. She reports that she does not drink alcohol or use illicit drugs.  Allergies: No Known Allergies  Medications Prior to Admission  Medication Sig Dispense Refill  . acetaminophen (TYLENOL) 325 MG tablet Take 650 mg by mouth every 8 (eight) hours.       Marland Kitchen acyclovir (ZOVIRAX) 400 MG tablet Take 200 mg by mouth daily.      Marland Kitchen amLODipine (NORVASC) 10 MG tablet Take 10 mg by mouth daily.      Marland Kitchen amoxicillin (AMOXIL) 500 MG capsule Take 4 capsules by mouth prior to dental procedure as directed by the access dental care staff      . ARTIFICIAL TEAR OP Place 1-2 drops into both eyes 4 (four) times daily.      . calcium-vitamin D (OSCAL WITH D) 500-200 MG-UNIT per tablet Take 1 tablet by mouth daily.      . celecoxib (CELEBREX) 200 MG capsule Take 200 mg by mouth daily.      . DULoxetine (CYMBALTA) 30 MG capsule Take 30 mg by mouth daily.       Marland Kitchen  Fluticasone-Salmeterol (ADVAIR) 100-50 MCG/DOSE AEPB Inhale 1 puff into the lungs every 12 (twelve) hours.      . furosemide (LASIX) 20 MG tablet Take 20 mg by mouth daily.      Marland Kitchen gabapentin (NEURONTIN) 100 MG capsule Take 100 mg by mouth 3 (three) times daily.      Marland Kitchen ipratropium-albuterol (DUONEB) 0.5-2.5 (3) MG/3ML SOLN Take 3 mLs by nebulization every 6 (six) hours as needed (shortness of breath).      . isosorbide mononitrate (IMDUR) 30 MG 24 hr tablet Take 30 mg by mouth daily.      Marland Kitchen levofloxacin (LEVAQUIN) 500 MG tablet Take 500 mg by mouth daily. At 5 pm.      . levothyroxine (SYNTHROID, LEVOTHROID) 25 MCG tablet Take  25 mcg by mouth daily.       Marland Kitchen losartan (COZAAR) 50 MG tablet Take 50 mg by mouth 2 (two) times daily.      . Memantine HCl ER (NAMENDA XR) 7 MG CP24 Take 14 mg by mouth daily.      . nebivolol (BYSTOLIC) 10 MG tablet Take 1 tablet (10 mg total) by mouth daily.  30 tablet  5  . omeprazole (PRILOSEC) 40 MG capsule Take 40 mg by mouth 2 (two) times daily.      . potassium chloride (MICRO-K) 10 MEQ CR capsule Take 10 mEq by mouth daily.       . promethazine (PHENERGAN) 12.5 MG suppository Place 12.5 mg rectally every 4 (four) hours as needed for nausea or vomiting.      . saccharomyces boulardii (FLORASTOR) 250 MG capsule Take 250 mg by mouth 2 (two) times daily. At 8 am and 5 pm.      . senna-docusate (SENOKOT S) 8.6-50 MG per tablet Take 1 tablet by mouth 2 (two) times daily.  60 tablet  5  . tiotropium (SPIRIVA) 18 MCG inhalation capsule Place 18 mcg into inhaler and inhale daily.      . traMADol (ULTRAM) 50 MG tablet Take 50 mg by mouth every 8 (eight) hours. 1 by mouth three times daily (6 am, 2 pm, 10 pm)        Blood pressure 141/71, pulse 66, temperature 98.2 F (36.8 C), temperature source Oral, resp. rate 16, height _0  (1.575 m), weight 144 lb 13.5 oz (65.7 kg), SpO2 93.00%. Physical Exam: General: pleasant, WD/WN white female who is laying in bed in NAD HEENT: head is normocephalic, atraumatic.  Sclera are noninjected.  PERRL.  Ears and nose without any masses or lesions.  Mouth is pink and moist Heart: regular, rate, and rhythm.  No obvious murmurs, gallops, or rubs noted.  Palpable pedal pulses bilaterally Lungs: CTAB, no wheezes, rhonchi, or rales noted.  Respiratory effort nonlabored Abd: soft, mild distension, tender throughout, no specific area more tender than other areas, +BS, no masses, hernias, or organomegaly, did not visualize any scars MS: all 4 extremities are symmetrical with no cyanosis, clubbing, or edema. Skin: warm and dry with no masses, lesions, or rashes Psych:  minimally arousable and oriented to her name and birthday only   Results for orders placed during the hospital encounter of 12/22/13 (from the past 48 hour(s))  CBC WITH DIFFERENTIAL     Status: Abnormal   Collection Time    12/22/13  5:45 PM      Result Value Ref Range   WBC 17.5 (*) 4.0 - 10.5 K/uL   RBC 4.12  3.87 - 5.11 MIL/uL   Hemoglobin  13.0  12.0 - 15.0 g/dL   HCT 38.9  36.0 - 46.0 %   MCV 94.4  78.0 - 100.0 fL   MCH 31.6  26.0 - 34.0 pg   MCHC 33.4  30.0 - 36.0 g/dL   RDW 14.8  11.5 - 15.5 %   Platelets 542 (*) 150 - 400 K/uL   Neutrophils Relative % 84 (*) 43 - 77 %   Neutro Abs 14.6 (*) 1.7 - 7.7 K/uL   Lymphocytes Relative 8 (*) 12 - 46 %   Lymphs Abs 1.4  0.7 - 4.0 K/uL   Monocytes Relative 8  3 - 12 %   Monocytes Absolute 1.5 (*) 0.1 - 1.0 K/uL   Eosinophils Relative 0  0 - 5 %   Eosinophils Absolute 0.1  0.0 - 0.7 K/uL   Basophils Relative 0  0 - 1 %   Basophils Absolute 0.0  0.0 - 0.1 K/uL  I-STAT TROPOININ, ED     Status: None   Collection Time    12/22/13  5:57 PM      Result Value Ref Range   Troponin i, poc 0.02  0.00 - 0.08 ng/mL   Comment 3            Comment: Due to the release kinetics of cTnI,     a negative result within the first hours     of the onset of symptoms does not rule out     myocardial infarction with certainty.     If myocardial infarction is still suspected,     repeat the test at appropriate intervals.  I-STAT CHEM 8, ED     Status: Abnormal   Collection Time    12/22/13  5:59 PM      Result Value Ref Range   Sodium 137  137 - 147 mEq/L   Potassium 4.2  3.7 - 5.3 mEq/L   Chloride 105  96 - 112 mEq/L   BUN 26 (*) 6 - 23 mg/dL   Creatinine, Ser 1.50 (*) 0.50 - 1.10 mg/dL   Glucose, Bld 116 (*) 70 - 99 mg/dL   Calcium, Ion 1.12 (*) 1.13 - 1.30 mmol/L   TCO2 21  0 - 100 mmol/L   Hemoglobin 14.6  12.0 - 15.0 g/dL   HCT 43.0  36.0 - 46.0 %  PRO B NATRIURETIC PEPTIDE     Status: Abnormal   Collection Time    12/22/13  6:58 PM       Result Value Ref Range   Pro B Natriuretic peptide (BNP) 4519.0 (*) 0 - 450 pg/mL  LIPASE, BLOOD     Status: None   Collection Time    12/22/13  6:58 PM      Result Value Ref Range   Lipase 43  11 - 59 U/L  URINALYSIS, ROUTINE W REFLEX MICROSCOPIC     Status: Abnormal   Collection Time    12/22/13  7:08 PM      Result Value Ref Range   Color, Urine YELLOW  YELLOW   APPearance CLOUDY (*) CLEAR   Specific Gravity, Urine 1.017  1.005 - 1.030   pH 5.0  5.0 - 8.0   Glucose, UA NEGATIVE  NEGATIVE mg/dL   Hgb urine dipstick NEGATIVE  NEGATIVE   Bilirubin Urine NEGATIVE  NEGATIVE   Ketones, ur NEGATIVE  NEGATIVE mg/dL   Protein, ur NEGATIVE  NEGATIVE mg/dL   Urobilinogen, UA 0.2  0.0 - 1.0 mg/dL   Nitrite NEGATIVE  NEGATIVE   Leukocytes, UA MODERATE (*) NEGATIVE  URINE MICROSCOPIC-ADD ON     Status: Abnormal   Collection Time    12/22/13  7:08 PM      Result Value Ref Range   Squamous Epithelial / LPF RARE  RARE   WBC, UA 7-10  <3 WBC/hpf   RBC / HPF 0-2  <3 RBC/hpf   Bacteria, UA FEW (*) RARE   Casts HYALINE CASTS (*) NEGATIVE   Urine-Other FEW YEAST    URINE CULTURE     Status: None   Collection Time    12/22/13  7:08 PM      Result Value Ref Range   Specimen Description URINE, CATHETERIZED     Special Requests NONE     Culture  Setup Time       Value: 12/23/2013 15:31     Performed at [facility] PENDING     Culture       Value: Culture reincubated for better growth     Performed at [facility]   Report Status PENDING    COMPREHENSIVE METABOLIC PANEL     Status: Abnormal   Collection Time    12/22/13  8:22 PM      Result Value Ref Range   Sodium 134 (*) 137 - 147 mEq/L   Potassium 4.0  3.7 - 5.3 mEq/L   Chloride 96  96 - 112 mEq/L   Comment: DELTA CHECK NOTED     REPEATED TO VERIFY   CO2 23  19 - 32 mEq/L   Glucose, Bld 130 (*) 70 - 99 mg/dL   BUN 29 (*) 6 - 23 mg/dL   Creatinine, Ser 1.41 (*) 0.50 - 1.10 mg/dL   Calcium 9.2   8.4 - 10.5 mg/dL   Total Protein 7.0  6.0 - 8.3 g/dL   Albumin 2.6 (*) 3.5 - 5.2 g/dL   AST 16  0 - 37 U/L   ALT 12  0 - 35 U/L   Alkaline Phosphatase 272 (*) 39 - 117 U/L   Total Bilirubin 0.5  0.3 - 1.2 mg/dL   GFR calc non Af Amer 31 (*) >90 mL/min   GFR calc Af Amer 36 (*) >90 mL/min   Comment: (NOTE)     The eGFR has been calculated using the CKD EPI equation.     This calculation has not been validated in all clinical situations.     eGFR's persistently <90 mL/min signify possible Chronic Kidney     Disease.   Anion gap 15  5 - 15  TROPONIN I     Status: None   Collection Time    12/22/13 11:37 PM      Result Value Ref Range   Troponin I <0.30  <0.30 ng/mL   Comment:            Due to the release kinetics of cTnI,     a negative result within the first hours     of the onset of symptoms does not rule out     myocardial infarction with certainty.     If myocardial infarction is still suspected,     repeat the test at appropriate intervals.  TROPONIN I     Status: None   Collection Time    12/23/13  5:25 AM      Result Value Ref Range   Troponin I <0.30  <0.30 ng/mL   Comment:  Due to the release kinetics of cTnI,     a negative result within the first hours     of the onset of symptoms does not rule out     myocardial infarction with certainty.     If myocardial infarction is still suspected,     repeat the test at appropriate intervals.  BASIC METABOLIC PANEL     Status: Abnormal   Collection Time    12/23/13  5:28 AM      Result Value Ref Range   Sodium 135 (*) 137 - 147 mEq/L   Potassium 3.6 (*) 3.7 - 5.3 mEq/L   Chloride 95 (*) 96 - 112 mEq/L   CO2 23  19 - 32 mEq/L   Glucose, Bld 88  70 - 99 mg/dL   BUN 30 (*) 6 - 23 mg/dL   Creatinine, Ser 1.43 (*) 0.50 - 1.10 mg/dL   Calcium 9.0  8.4 - 10.5 mg/dL   GFR calc non Af Amer 30 (*) >90 mL/min   GFR calc Af Amer 35 (*) >90 mL/min   Comment: (NOTE)     The eGFR has been calculated using the CKD EPI  equation.     This calculation has not been validated in all clinical situations.     eGFR's persistently <90 mL/min signify possible Chronic Kidney     Disease.   Anion gap 17 (*) 5 - 15  CBC     Status: Abnormal   Collection Time    12/23/13  5:28 AM      Result Value Ref Range   WBC 16.7 (*) 4.0 - 10.5 K/uL   RBC 3.79 (*) 3.87 - 5.11 MIL/uL   Hemoglobin 11.9 (*) 12.0 - 15.0 g/dL   Comment: DELTA CHECK NOTED     REPEATED TO VERIFY   HCT 35.7 (*) 36.0 - 46.0 %   MCV 94.2  78.0 - 100.0 fL   MCH 31.4  26.0 - 34.0 pg   MCHC 33.3  30.0 - 36.0 g/dL   RDW 14.8  11.5 - 15.5 %   Platelets 490 (*) 150 - 400 K/uL  TROPONIN I     Status: None   Collection Time    12/23/13 11:20 AM      Result Value Ref Range   Troponin I <0.30  <0.30 ng/mL   Comment:            Due to the release kinetics of cTnI,     a negative result within the first hours     of the onset of symptoms does not rule out     myocardial infarction with certainty.     If myocardial infarction is still suspected,     repeat the test at appropriate intervals.  BASIC METABOLIC PANEL     Status: Abnormal   Collection Time    12/24/13  5:00 AM      Result Value Ref Range   Sodium 140  137 - 147 mEq/L   Potassium 3.1 (*) 3.7 - 5.3 mEq/L   Chloride 99  96 - 112 mEq/L   CO2 24  19 - 32 mEq/L   Glucose, Bld 66 (*) 70 - 99 mg/dL   BUN 26 (*) 6 - 23 mg/dL   Creatinine, Ser 1.20 (*) 0.50 - 1.10 mg/dL   Calcium 8.7  8.4 - 10.5 mg/dL   GFR calc non Af Amer 37 (*) >90 mL/min   GFR calc Af Amer 43 (*) >90 mL/min  Comment: (NOTE)     The eGFR has been calculated using the CKD EPI equation.     This calculation has not been validated in all clinical situations.     eGFR's persistently <90 mL/min signify possible Chronic Kidney     Disease.   Anion gap 17 (*) 5 - 15  CBC     Status: Abnormal   Collection Time    12/24/13  5:00 AM      Result Value Ref Range   WBC 12.4 (*) 4.0 - 10.5 K/uL   RBC 3.86 (*) 3.87 - 5.11 MIL/uL    Hemoglobin 11.9 (*) 12.0 - 15.0 g/dL   HCT 36.9  36.0 - 46.0 %   MCV 95.6  78.0 - 100.0 fL   MCH 30.8  26.0 - 34.0 pg   MCHC 32.2  30.0 - 36.0 g/dL   RDW 14.7  11.5 - 15.5 %   Platelets 503 (*) 150 - 400 K/uL  PRO B NATRIURETIC PEPTIDE     Status: Abnormal   Collection Time    12/24/13  5:00 AM      Result Value Ref Range   Pro B Natriuretic peptide (BNP) 2064.0 (*) 0 - 450 pg/mL  MAGNESIUM     Status: None   Collection Time    12/24/13  5:52 AM      Result Value Ref Range   Magnesium 2.0  1.5 - 2.5 mg/dL   Dg Chest 2 View  12/22/2013   CLINICAL DATA:  Short of breath, fever, recent pneumonia  EXAM: CHEST  2 VIEW  COMPARISON:  Chest radiograph 12/04/2013  FINDINGS: There cardiac silhouette is enlarged. There is left basilar atelectasis and a moderate left effusion which is increased compared to prior. There is smaller right effusion. No clear infiltrate is identified.  IMPRESSION: Increasing left basilar atelectasis and effusion.   Electronically Signed   By: Suzy Bouchard M.D.   On: 12/22/2013 18:19   Ct Head Wo Contrast  12/22/2013   CLINICAL DATA:  Progressive weakness and fatigue. Dementia. Anemia. Parkinson's disease.  EXAM: CT HEAD WITHOUT CONTRAST  TECHNIQUE: Contiguous axial images were obtained from the base of the skull through the vertex without intravenous contrast.  COMPARISON:  12/04/2013  FINDINGS: Sinuses/Soft tissues: Surgical changes of both globes. Fluid in the sphenoid sinus is decreased. Clear mastoid air cells.  Intracranial: Expected cerebral atrophy. Moderate to marked low density in the periventricular white matter likely related to small vessel disease. Bilateral vertebral atherosclerosis. Scattered lacunar infarcts within the basal ganglia and thalami, likely chronic.  No mass lesion, hemorrhage, hydrocephalus, acute infarct, intra-axial, or extra-axial fluid collection.  IMPRESSION: 1.  No acute intracranial abnormality. 2.  Cerebral atrophy and small vessel  ischemic change. 3. Sinus disease.   Electronically Signed   By: Abigail Miyamoto M.D.   On: 12/22/2013 18:27   Ct Chest Wo Contrast  12/23/2013   CLINICAL DATA:  80 year old with shortness of breath and pleural effusions. History of chronic diastolic congestive heart failure, COPD and hypothyroidism.  EXAM: CT CHEST WITHOUT CONTRAST  TECHNIQUE: Multidetector CT imaging of the chest was performed following the standard protocol without IV contrast.  COMPARISON:  Radiographs 12/22/2013.  CT 03/13/2006.  FINDINGS: Mediastinum: There are no enlarged mediastinal, hilar or axillary lymph nodes. The thyroid gland appears normal. Tracheobronchial calcifications are noted. There is a small hiatal hernia. The heart is mildly enlarged.There is mild atherosclerosis of the aorta, great vessels and coronary arteries.  Lungs/Pleura: There are  small dependent bilateral pleural effusions. There is no pericardial effusion. Dependent opacities at both lung bases are consistent with atelectasis. There are patchy ground-glass opacities with an upper lobe predominance, possibly reflecting edema. There is no consolidation or suspicious pulmonary nodule.  Upper abdomen: There is an enlarging cyst in the upper pole of the right kidney, measuring 4.4 cm on image 49. The gallbladder is surgically absent. There is no adrenal mass.  Musculoskeletal/Chest wall: No chest wall masses or suspicious osseous findings demonstrated.  IMPRESSION: 1. Bilateral pleural effusions with associated bibasilar atelectasis. 2. Patchy ground-glass opacities in both lungs suggesting mild edema. No consolidation or suspicious pulmonary nodule. 3. Mild cardiomegaly and atherosclerosis.   Electronically Signed   By: Camie Patience M.D.   On: 12/23/2013 09:49   Dg Abd 2 Views  12/24/2013   CLINICAL DATA:  Bowel obstruction  EXAM: ABDOMEN - 2 VIEW  COMPARISON:  December 23, 2013  FINDINGS: Supine and left lateral decubitus images were obtained. Several loops of  borderline dilated small bowel remain with scattered air-fluid levels. No free air is appreciable.  There is a total hip prosthesis in the right. There is postoperative change in the left proximal femur with resorption of the left femoral head.  IMPRESSION: The bowel gas pattern is consistent with ileus or a degree of partial obstruction. There is very little change compared to 1 day prior. No free air seen.   Electronically Signed   By: Lowella Grip M.D.   On: 12/24/2013 08:46   Dg Abd 2 Views  12/23/2013   CLINICAL DATA:  Abdominal pain  EXAM: ABDOMEN - 2 VIEW  COMPARISON:  None.  FINDINGS: Scattered large and small bowel gas is noted. Differential air-fluid levels are seen with mild small bowel dilatation. No definitive free air is seen. Degenerative changes of the lumbar spine are noted. Postsurgical changes are seen in both hips. Chronic changes of proximal left femur are noted.  IMPRESSION: Mild small bowel dilatation likely indicating a partial small bowel obstruction. Correlation with physical exam is recommended.   Electronically Signed   By: Inez Catalina M.D.   On: 12/23/2013 09:45      Assessment/Plan Abdominal pain/distension Nausea/vomiting ?ileus vs SBO UTI  Plan: 1.  Conservative management for now, if not improved in 2-3 days may need surgical interventions.  Would be very hesitant to operate on her given her age and co-morbidities.  Would need cardiac clearance. 2.  NPO, bowel rest, IVF, pain control, antiemetics, low threshold for NG tube if vomiting returns 3.  She can not ambulate due to left leg/hip issues from her previous surgery 4.  Not following commands due to sedation from pain medication. 5.  Talked with brother Corrin Parker (home [phone], cell [phone]) , sister Mechele Claude [phone]), and son Mikisha Roseland [phone] cell or [phone] home).  Bill and Mechele Claude seem to be against surgery altogether, but the son would consider surgery if an emergency situation.  Marden Noble  (son) is financial POA she does not have a HCPOA.  He is understanding of the conservative management for now and will discuss with her extended family regarding their decision towards surgery in case this situation would arise.   6.  Consider CT scan - no personal h/o cancer of family h/o colon cancer 7.  Repeat KUB in am and labs   Coralie Keens, [facility] 12/24/2013, 2:58 PM Pager: [phone]  Agree with above. Certainly a non operative approach would be practical.  Alphonsa Overall, MD,  [facility] Pager: [phone] Office phone:  [phone]

## 2013-12-24 NOTE — Progress Notes (Signed)
[facility] PROGRESS NOTE  [name] [name] ZOX:[id number] DOB: 05-Mar-1918 DOA: 12/22/2013 PCP: Kimber Relic, MD  Assessment/Plan: #1 probable partial small bowel obstruction versus ileus Abdominal x-rays. Patient had presented with complaints of abdominal pain and inability to keep food down. Patient denies any nausea or emesis. No bowel movement noted. Abdominal x-ray today with no significant change. Patient currently on bowel rest. Continue supportive care,antiemetics, pain management. Serial abdominal films. Mobilize. Consult [facility] for further eval and rxcs.  #2 dyspnea with left pleural effusion/probable acute on chronic Diastolic heart failure. Patient was noted to have elevated proBNP of 4519 on admission. Patient noted with some crackles on examination. Cardiac enzymes negative x3. 2-D echo pending. CT of the chest with bilateral effusions and associated bibasilar atelectasis. Patchy groundglass opacities in both lungs suggesting mild edema. No consultation no suspicious pulmonary nodule. Mild cardiomegaly and atherosclerosis. Patient likely does not have a pneumonia. Continue IV diuretics. Strict is and os. Daily weights.  #3 UTI Urine cultures pending. Continue IV Zosyn.  #4 leukocytosis Likely secondary to problem #3. Urine cultures pending. WBC trending down. Continue empiric IV Zosyn. Follow.  #5 dementia When tolerating oral intake will resume home regimen of Namenda.  #6 acute on chronic kidney disease stage II to III Baseline creatinine was 0.9-1.0. Renal function improvement with diuretics.  #7 nausea and emesis Likely secondary to problem number of 1.  #8 COPD Stable. Continue Spiriva and alert.  #9 hypertension Continue IV Lopressor.   #10 hypokalemia Likely secondary to diureses. Replete.  #11 coronary artery disease Stable. Cardiac enzymes negative x3. 2-D echo pending. Continue IV Lopressor.  #12 depression Continue Cymbalta.  #13 acute  encephalopathy Likely secondary to urinary tract infection in the setting of dementia. Urine cultures are pending. Continue empiric antibiotics. Follow.  #14 prophylaxis Protonix for GI prophylaxis. Heparin for DVT prophylaxis.  Code Status: DO NOT RESUSCITATE Family Communication: Updated brother at bedside. Disposition Plan: Back to skilled nursing facility when medically stable.   Consultants:  None  Procedures:  Abdominal series 12/23/2013  CT chest 12/23/2013  2 d echo 12/24/13  Antibiotics:  IV vancomycin 12/22/2013>>>> 12/23/2013  IV Zosyn 12/22/2013  HPI/Subjective: Patient sleeping. Patient just received IV morphine. Per nursing no emesis. No bowel movement.  Objective: Filed Vitals:   12/24/13 0526  BP: 156/52  Pulse: 72  Temp: 98.4 F (36.9 C)  Resp: 16    Intake/Output Summary (Last 24 hours) at 12/24/13 1352 Last data filed at 12/24/13 0159  Gross per 24 hour  Intake    200 ml  Output      0 ml  Net    200 ml   Filed Weights   12/22/13 2224 12/24/13 0526  Weight: 68.1 kg (150 lb 2.1 oz) 65.7 kg (144 lb 13.5 oz)    Exam:   General:  NAD  Cardiovascular: RRR  Respiratory: CTAB anterior lung Weir.  Abdomen: Soft, nontender, nondistended, hypoactive bowel sounds.  Musculoskeletal: No clubbing cyanosis or edema  Data Reviewed: Basic Metabolic Panel:  Recent Labs Lab 12/22/13 1759 12/22/13 2022 12/23/13 0528 12/24/13 0500 12/24/13 0552  NA 137 134* 135* 140  --   K 4.2 4.0 3.6* 3.1*  --   CL 105 96 95* 99  --   CO2  --  23 23 24   --   GLUCOSE 116* 130* 88 66*  --   BUN 26* 29* 30* 26*  --   CREATININE 1.50* 1.41* 1.43* 1.20*  --   CALCIUM  --  9.2 9.0 8.7  --   MG  --   --   --   --  2.0   Liver Function Tests:  Recent Labs Lab 12/22/13 2022  AST 16  ALT 12  ALKPHOS 272*  BILITOT 0.5  PROT 7.0  ALBUMIN 2.6*    Recent Labs Lab 12/22/13 1858  LIPASE 43   No results found for this basename: AMMONIA,  in the  last 168 hours CBC:  Recent Labs Lab 12/22/13 1745 12/22/13 1759 12/23/13 0528 12/24/13 0500  WBC 17.5*  --  16.7* 12.4*  NEUTROABS 14.6*  --   --   --   HGB 13.0 14.6 11.9* 11.9*  HCT 38.9 43.0 35.7* 36.9  MCV 94.4  --  94.2 95.6  PLT 542*  --  490* 503*   Cardiac Enzymes:  Recent Labs Lab 12/22/13 2337 12/23/13 0525 12/23/13 1120  TROPONINI <0.30 <0.30 <0.30   BNP (last 3 results)  Recent Labs  12/22/13 1858 12/24/13 0500  PROBNP 4519.0* 2064.0*   CBG: No results found for this basename: GLUCAP,  in the last 168 hours  No results found for this or any previous visit (from the past 240 hour(s)).   Studies: Dg Chest 2 View  12/22/2013   CLINICAL DATA:  Short of breath, fever, recent pneumonia  EXAM: CHEST  2 VIEW  COMPARISON:  Chest radiograph 12/04/2013  FINDINGS: There cardiac silhouette is enlarged. There is left basilar atelectasis and a moderate left effusion which is increased compared to prior. There is smaller right effusion. No clear infiltrate is identified.  IMPRESSION: Increasing left basilar atelectasis and effusion.   Electronically Signed   By: Genevive BiStewart  Edmunds M.D.   On: 12/22/2013 18:19   Ct Head Wo Contrast  12/22/2013   CLINICAL DATA:  Progressive weakness and fatigue. Dementia. Anemia. Parkinson's disease.  EXAM: CT HEAD WITHOUT CONTRAST  TECHNIQUE: Contiguous axial images were obtained from the base of the skull through the vertex without intravenous contrast.  COMPARISON:  12/04/2013  FINDINGS: Sinuses/Soft tissues: Surgical changes of both globes. Fluid in the sphenoid sinus is decreased. Clear mastoid air cells.  Intracranial: Expected cerebral atrophy. Moderate to marked low density in the periventricular white matter likely related to small vessel disease. Bilateral vertebral atherosclerosis. Scattered lacunar infarcts within the basal ganglia and thalami, likely chronic.  No mass lesion, hemorrhage, hydrocephalus, acute infarct, intra-axial, or  extra-axial fluid collection.  IMPRESSION: 1.  No acute intracranial abnormality. 2.  Cerebral atrophy and small vessel ischemic change. 3. Sinus disease.   Electronically Signed   By: Jeronimo GreavesKyle  Talbot M.D.   On: 12/22/2013 18:27   Ct Chest Wo Contrast  12/23/2013   CLINICAL DATA:  80 year old with shortness of breath and pleural effusions. History of chronic diastolic congestive heart failure, COPD and hypothyroidism.  EXAM: CT CHEST WITHOUT CONTRAST  TECHNIQUE: Multidetector CT imaging of the chest was performed following the standard protocol without IV contrast.  COMPARISON:  Radiographs 12/22/2013.  CT 03/13/2006.  FINDINGS: Mediastinum: There are no enlarged mediastinal, hilar or axillary lymph nodes. The thyroid gland appears normal. Tracheobronchial calcifications are noted. There is a small hiatal hernia. The heart is mildly enlarged.There is mild atherosclerosis of the aorta, great vessels and coronary arteries.  Lungs/Pleura: There are small dependent bilateral pleural effusions. There is no pericardial effusion. Dependent opacities at both lung bases are consistent with atelectasis. There are patchy ground-glass opacities with an upper lobe predominance, possibly reflecting edema. There is no consolidation or suspicious pulmonary  nodule.  Upper abdomen: There is an enlarging cyst in the upper pole of the right kidney, measuring 4.4 cm on image 49. The gallbladder is surgically absent. There is no adrenal mass.  Musculoskeletal/Chest wall: No chest wall masses or suspicious osseous findings demonstrated.  IMPRESSION: 1. Bilateral pleural effusions with associated bibasilar atelectasis. 2. Patchy ground-glass opacities in both lungs suggesting mild edema. No consolidation or suspicious pulmonary nodule. 3. Mild cardiomegaly and atherosclerosis.   Electronically Signed   By: Roxy Horseman M.D.   On: 12/23/2013 09:49   Dg Abd 2 Views  12/24/2013   CLINICAL DATA:  Bowel obstruction  EXAM: ABDOMEN - 2 VIEW   COMPARISON:  December 23, 2013  FINDINGS: Supine and left lateral decubitus images were obtained. Several loops of borderline dilated small bowel remain with scattered air-fluid levels. No free air is appreciable.  There is a total hip prosthesis in the right. There is postoperative change in the left proximal femur with resorption of the left femoral head.  IMPRESSION: The bowel gas pattern is consistent with ileus or a degree of partial obstruction. There is very little change compared to 1 day prior. No free air seen.   Electronically Signed   By: Bretta Bang M.D.   On: 12/24/2013 08:46   Dg Abd 2 Views  12/23/2013   CLINICAL DATA:  Abdominal pain  EXAM: ABDOMEN - 2 VIEW  COMPARISON:  None.  FINDINGS: Scattered large and small bowel gas is noted. Differential air-fluid levels are seen with mild small bowel dilatation. No definitive free air is seen. Degenerative changes of the lumbar spine are noted. Postsurgical changes are seen in both hips. Chronic changes of proximal left femur are noted.  IMPRESSION: Mild small bowel dilatation likely indicating a partial small bowel obstruction. Correlation with physical exam is recommended.   Electronically Signed   By: Alcide Clever M.D.   On: 12/23/2013 09:45    Scheduled Meds: . acyclovir  200 mg Oral Daily  . antiseptic oral rinse  7 mL Mouth Rinse q12n4p  . calcium-vitamin D  1 tablet Oral Daily  . chlorhexidine  15 mL Mouth Rinse BID  . DULoxetine  30 mg Oral Daily  . furosemide  20 mg Intravenous BID  . gabapentin  100 mg Oral TID  . heparin  5,000 Units Subcutaneous 3 times per day  . isosorbide mononitrate  30 mg Oral Daily  . levothyroxine  12.5 mcg Intravenous Daily  . metoprolol  2.5 mg Intravenous 3 times per day  . mometasone-formoterol  2 puff Inhalation BID  . pantoprazole (PROTONIX) IV  80 mg Intravenous Q24H  . piperacillin-tazobactam  3.375 g Intravenous Q8H  . potassium chloride  10 mEq Intravenous Q1 Hr x 5  . senna-docusate  1  tablet Oral BID  . tiotropium  18 mcg Inhalation Daily   Continuous Infusions:   Principal Problem:   SBO (small bowel obstruction) Active Problems:   Acute on chronic diastolic heart failure   Chronic diastolic CHF (congestive heart failure)   HCAP (healthcare-associated pneumonia)   UTI (urinary tract infection)   Acute encephalopathy    Time spent: 35 mins    [facility] MD [facility] Pager [phone]. If 7PM-7AM, please contact night-coverage at [email or web address], password [facility] 12/24/2013, 1:52 PM  LOS: 2 days

## 2013-12-24 NOTE — Progress Notes (Signed)
Clinical Social Work  Patient was discussed during progression meeting and MD reports patient is not medically stable. CSW updated SNF who remains agreeable to accept. Patient's son Gala Romney(Doug) updated and agreeable for patient to return. CSW will continue to follow.  [place] Sandy[place]olly Wajiha Versteeg, [place] [phone]

## 2013-12-24 NOTE — Progress Notes (Signed)
  Echocardiogram 2D Echocardiogram has been performed.  [name], [name] [name] 12/24/2013, 2:18 PM

## 2013-12-25 ENCOUNTER — Inpatient Hospital Stay (HOSPITAL_COMMUNITY): Payer: Medicare Other

## 2013-12-25 DIAGNOSIS — K56609 Unspecified intestinal obstruction, unspecified as to partial versus complete obstruction: Secondary | ICD-10-CM

## 2013-12-25 DIAGNOSIS — K560 Paralytic ileus: Secondary | ICD-10-CM

## 2013-12-25 DIAGNOSIS — K567 Ileus, unspecified: Secondary | ICD-10-CM | POA: Diagnosis present

## 2013-12-25 LAB — CBC
HEMATOCRIT: 40.8 % (ref 36.0–46.0)
Hemoglobin: 13.7 g/dL (ref 12.0–15.0)
MCH: 32.2 pg (ref 26.0–34.0)
MCHC: 33.6 g/dL (ref 30.0–36.0)
MCV: 95.8 fL (ref 78.0–100.0)
Platelets: 541 10*3/uL — ABNORMAL HIGH (ref 150–400)
RBC: 4.26 MIL/uL (ref 3.87–5.11)
RDW: 14.8 % (ref 11.5–15.5)
WBC: 11.8 10*3/uL — ABNORMAL HIGH (ref 4.0–10.5)

## 2013-12-25 LAB — URINE CULTURE: Colony Count: >=100000

## 2013-12-25 LAB — BASIC METABOLIC PANEL
Anion gap: 17 — ABNORMAL HIGH (ref 5–15)
BUN: 19 mg/dL (ref 6–23)
CALCIUM: 9.1 mg/dL (ref 8.4–10.5)
CO2: 27 meq/L (ref 19–32)
Chloride: 99 mEq/L (ref 96–112)
Creatinine, Ser: 0.99 mg/dL (ref 0.50–1.10)
GFR calc Af Amer: 55 mL/min — ABNORMAL LOW (ref >90)
GFR, EST NON AFRICAN AMERICAN: 47 mL/min — AB (ref >90)
GLUCOSE: 87 mg/dL (ref 70–99)
Potassium: 4.1 mEq/L (ref 3.7–5.3)
SODIUM: 143 meq/L (ref 137–147)

## 2013-12-25 LAB — PRO B NATRIURETIC PEPTIDE: PRO B NATRI PEPTIDE: 3606 pg/mL — AB (ref 0–450)

## 2013-12-25 MED ORDER — MORPHINE SULFATE 2 MG/ML IJ SOLN: 0.5000 mg | INTRAMUSCULAR | Status: DC | PRN

## 2013-12-25 NOTE — Progress Notes (Signed)
PT Cancellation Note  Patient Details Name: [name] [name] MRN: [id number] DOB: 06/04/1917   Cancelled Treatment:    Reason Eval/Treat Not Completed: PT screened, no needs identified, will sign off; Spoke with pt family and they state that pt is total assist for bed to chair transfers at baseline, able to put very minimal wt on her LEs for sometime; she is non-amb; she is total care for ADL's other than she does feed herself after step up; Pt may benefit from PT eval at SNF to decrease burden of care;   [facility] SystemWILLIAMS,[name] Bovey 12/25/2013, 9:54 AM

## 2013-12-25 NOTE — Progress Notes (Signed)
Occupational Therapy Evaluation Patient Details Name: [name] [name] MRN: [id number] DOB: 1917-05-30 Today's Date: 12/25/2013    History of Present Illness 80 year old female with a history of dementia, chronic diastolic CHF, COPD, hypothyroidism, hypertension, CAD presents with nausea, vomiting, and abdominal pain.   Clinical Impression   Patient presents needing assistance with ADLs/non-ambulatory at baseline, history of dementia. All OT needs can be met in next venue of care [facility] SNF). No acute OT needed at this time.    Follow Up Recommendations  SNF;Supervision/Assistance - 24 hour    Equipment Recommendations  None recommended by OT    Recommendations for Other Services       Precautions / Restrictions Precautions Precautions: Fall      Mobility Bed Mobility                  Transfers                      Balance                                            ADL Overall ADL's : Needs assistance/impaired Eating/Feeding: NPO   Grooming: Wash/dry hands;Wash/dry face;Minimal assistance;Cueing for sequencing   Upper Body Bathing: Total assistance   Lower Body Bathing: Total assistance   Upper Body Dressing : Total assistance   Lower Body Dressing: Total assistance               Functional mobility during ADLs:  (chart reports non-ambulatory; performed bed level eval) General ADL Comments: Needs cues all tasks due to dementia. Needs A with all ADLs at this time.     Vision                     Perception     Praxis      Pertinent Vitals/Pain Pain Assessment: No/denies pain     Hand Dominance  (unknown and pt unable to state)   Extremity/Trunk Assessment Upper Extremity Assessment Upper Extremity Assessment: Generalized weakness   Lower Extremity Assessment Lower Extremity Assessment: Defer to PT evaluation       Communication Communication Communication: No difficulties   Cognition  Arousal/Alertness: Lethargic Behavior During Therapy: Flat affect Overall Cognitive Status: History of cognitive impairments - at baseline       Memory: Decreased short-term memory             General Comments       Exercises       Shoulder Instructions      Home Living Family/patient expects to be discharged to:: Skilled nursing facility                                        Prior Functioning/Environment Level of Independence: Needs assistance  Gait / Transfers Assistance Needed: chart reports non-ambulatory at baseline ADL's / Homemaking Assistance Needed: Staff at SNF A with ADLs as needed        OT Diagnosis: Generalized weakness;Cognitive deficits   OT Problem List: Decreased strength;Decreased activity tolerance;Decreased cognition;Decreased knowledge of use of DME or AE   OT Treatment/Interventions:      OT Goals(Current goals can be found in the care plan section)    OT Frequency:     Barriers to  D/C:            Co-evaluation              End of Session    Activity Tolerance: Patient limited by lethargy Patient left: in bed;with call bell/phone within reach   Time: 0845-0900 OT Time Calculation (min): 15 min Charges:  OT General Charges $OT Visit: 1 Procedure OT Evaluation $Initial OT Evaluation Tier I: 1 Procedure OT Treatments $Self Care/Home Management : 8-22 mins G-Codes:    Rowan Pollman A 01-17-14, 9:26 AM

## 2013-12-25 NOTE — Progress Notes (Signed)
Patient ID: [name] [name], female   DOB: 1917-10-13, 80 y.o.   MRN: [id number]    Subjective: Patient is very alert. Family at bedside. She denies abdominal pain or nausea. States she feels like she has to have a bowel movement.  Objective: Vital signs in last 24 hours: Temp:  [98.2 F (36.8 C)-98.7 F (37.1 C)] 98.7 F (37.1 C) (08/15 [id number]) Pulse Rate:  [61-75] 61 (08/15 1001) Resp:  [16] 16 (08/15 0632) BP: (141-209)/(71-94) 175/82 mmHg (08/15 1001) SpO2:  [92 %-96 %] 96 % (08/15 0932) Weight:  [143 lb 11.8 oz (65.2 kg)] 143 lb 11.8 oz (65.2 kg) (08/15 [id number]) Last BM Date:  (pta)  Intake/Output from previous day:   Intake/Output this shift:    General appearance: alert, no distress and pleasantly confused GI: no apparent distention.  Soft and nontender. No masses.  Lab Results:   Recent Labs  12/24/13 0500 12/25/13 0600  WBC 12.4* 11.8*  HGB 11.9* 13.7  HCT 36.9 40.8  PLT 503* 541*   BMET  Recent Labs  12/24/13 0500 12/25/13 0600  NA 140 143  K 3.1* 4.1  CL 99 99  CO2 24 27  GLUCOSE 66* 87  BUN 26* 19  CREATININE 1.20* 0.99  CALCIUM 8.7 9.1     Studies/Results: Dg Abd 1 View  12/25/2013   CLINICAL DATA:  Abdominal pain. Evaluate partial small bowel obstruction.  EXAM: ABDOMEN - 1 VIEW  COMPARISON:  12/24/2013.  FINDINGS: AP supine radiograph demonstrates normal caliber the visualized small large bowel. Distal colonic gas can be seen in the rectum. Little change compared with yesterday's examination. No decubitus exam to evaluate for air-fluid levels or free air. Stable BILATERAL hip instrumentation.  IMPRESSION: Grossly unchanged bowel gas pattern.  No gross bowel obstruction.   Electronically Signed   By: Davonna BellingJohn  Curnes M.D.   On: 12/25/2013 08:45   Dg Abd 2 Views  12/24/2013   CLINICAL DATA:  Bowel obstruction  EXAM: ABDOMEN - 2 VIEW  COMPARISON:  December 23, 2013  FINDINGS: Supine and left lateral decubitus images were obtained. Several loops of borderline  dilated small bowel remain with scattered air-fluid levels. No free air is appreciable.  There is a total hip prosthesis in the right. There is postoperative change in the left proximal femur with resorption of the left femoral head.  IMPRESSION: The bowel gas pattern is consistent with ileus or a degree of partial obstruction. There is very little change compared to 1 day prior. No free air seen.   Electronically Signed   By: Bretta BangWilliam  Woodruff M.D.   On: 12/24/2013 08:46    Anti-infectives: Anti-infectives   Start     Dose/Rate Route Frequency Ordered Stop   12/23/13 2000  vancomycin (VANCOCIN) IVPB 750 mg/150 ml premix  Status:  Discontinued     750 mg 150 mL/hr over 60 Minutes Intravenous Every 24 hours 12/22/13 1947 12/23/13 1640   12/23/13 1000  acyclovir (ZOVIRAX) 200 MG capsule 200 mg     200 mg Oral Daily 12/22/13 2336     12/23/13 0200  piperacillin-tazobactam (ZOSYN) IVPB 3.375 g     3.375 g 12.5 mL/hr over 240 Minutes Intravenous Every 8 hours 12/22/13 2336     12/22/13 2000  vancomycin (VANCOCIN) IVPB 1000 mg/200 mL premix     1,000 mg 200 mL/hr over 60 Minutes Intravenous  Once 12/22/13 1941 12/22/13 2105   12/22/13 1930  ceFEPIme (MAXIPIME) 1 g in dextrose 5 % 50 mL IVPB  1 g 100 mL/hr over 30 Minutes Intravenous  Once 12/22/13 1901 12/22/13 2004      Assessment/Plan: Possible partial small bowel obstruction versus ileus. X-rays today showed no significant bowel distention. Likely appears to be resolving. Would go slowly and continue bowel rest today. Repeat x-rays tomorrow and hopefully could begin liquid diet. Certainly no indication for surgery at this point.    LOS: 3 days    Yaneth Fairbairn T 12/25/2013

## 2013-12-25 NOTE — Progress Notes (Signed)
[facility] PROGRESS NOTE  [name] [name] ZOX:[id number] DOB: 1918-03-14 DOA: 12/22/2013 PCP: Kimber Relic, MD  Assessment/Plan: #1 probable partial small bowel obstruction versus ileus Abdominal x-rays. Patient had presented with complaints of abdominal pain and inability to keep food down. Patient denies any nausea or emesis. No bowel movement noted. Abdominal x-ray today with no significant change. Patient currently on bowel rest. Continue supportive care,antiemetics, pain management. Serial abdominal films. Mobilize. CCS ff.   #2 dyspnea with left pleural effusion/probable acute on chronic Diastolic heart failure. Patient was noted to have elevated proBNP of 4519 on admission. Patient noted with some crackles on examination. Cardiac enzymes negative x3. 2-D echo with EF of 55-60%. No wall motion abnormalities. CT of the chest with bilateral effusions and associated bibasilar atelectasis. Patchy groundglass opacities in both lungs suggesting mild edema. No consultation no suspicious pulmonary nodule. Mild cardiomegaly and atherosclerosis. Patient likely does not have a pneumonia. Decrease IV diuretics. Strict is and os. Daily weights.  #3 Candiduria/ ??UTI Urine cultures c/w yeast . D/C  IV Zosyn.  #4 leukocytosis Likely secondary to problem #3. Urine cultures with yeast. WBC trending down. D/C  IV Zosyn. Follow.  #5 dementia When tolerating oral intake will resume home regimen of Namenda.  #6 acute on chronic kidney disease stage II to III Baseline creatinine was 0.9-1.0. Renal function improved with diuretics.  #7 nausea and emesis Likely secondary to problem number of 1.  #8 COPD Stable. Continue Spiriva and alert.  #9 hypertension Continue IV Lopressor.   #10 hypokalemia Likely secondary to diureses. Replete.  #11 coronary artery disease Stable. Cardiac enzymes negative x3. 2-D echo with EF of 55-60% with no wall motion abnormalities. Continue IV Lopressor.  #12  depression Continue Cymbalta.  #13 acute encephalopathy Likely secondary to urinary tract infection in the setting of dementia. Urine cultures with yeast. D/C IV antibiotics. Follow.  #14 prophylaxis Protonix for GI prophylaxis. Heparin for DVT prophylaxis.  Code Status: DO NOT RESUSCITATE Family Communication: Updated brother at bedside. Disposition Plan: Back to skilled nursing facility when medically stable.   Consultants:  None  Procedures:  Abdominal series 12/23/2013,12/25/13  CT chest 12/23/2013  2 d echo 12/24/13  Antibiotics:  IV vancomycin 12/22/2013>>>> 12/23/2013  IV Zosyn 12/22/2013>>>>>12/25/13  HPI/Subjective: Patient denies any nausea, no emesis, no abdominal pain. No bowel movement.  Objective: Filed Vitals:   12/25/13 1331  BP: 173/72  Pulse: 79  Temp: 98.3 F (36.8 C)  Resp: 17   No intake or output data in the 24 hours ending 12/25/13 1645 Filed Weights   12/22/13 2224 12/24/13 0526 12/25/13 8119  Weight: 68.1 kg (150 lb 2.1 oz) 65.7 kg (144 lb 13.5 oz) 65.2 kg (143 lb 11.8 oz)    Exam:   General:  NAD  Cardiovascular: RRR  Respiratory: CTAB anterior lung [place].  Abdomen: Soft, nontender, nondistended, hypoactive bowel sounds.  Musculoskeletal: No clubbing cyanosis or edema  Data Reviewed: Basic Metabolic Panel:  Recent Labs Lab 12/22/13 1759 12/22/13 2022 12/23/13 0528 12/24/13 0500 12/24/13 0552 12/25/13 0600  NA 137 134* 135* 140  --  143  K 4.2 4.0 3.6* 3.1*  --  4.1  CL 105 96 95* 99  --  99  CO2  --  23 23 24   --  27  GLUCOSE 116* 130* 88 66*  --  87  BUN 26* 29* 30* 26*  --  19  CREATININE 1.50* 1.41* 1.43* 1.20*  --  0.99  CALCIUM  --  9.2 9.0 8.7  --  9.1  MG  --   --   --   --  2.0  --    Liver Function Tests:  Recent Labs Lab 12/22/13 2022  AST 16  ALT 12  ALKPHOS 272*  BILITOT 0.5  PROT 7.0  ALBUMIN 2.6*    Recent Labs Lab 12/22/13 1858  LIPASE 43   No results found for this basename:  AMMONIA,  in the last 168 hours CBC:  Recent Labs Lab 12/22/13 1745 12/22/13 1759 12/23/13 0528 12/24/13 0500 12/25/13 0600  WBC 17.5*  --  16.7* 12.4* 11.8*  NEUTROABS 14.6*  --   --   --   --   HGB 13.0 14.6 11.9* 11.9* 13.7  HCT 38.9 43.0 35.7* 36.9 40.8  MCV 94.4  --  94.2 95.6 95.8  PLT 542*  --  490* 503* 541*   Cardiac Enzymes:  Recent Labs Lab 12/22/13 2337 12/23/13 0525 12/23/13 1120  TROPONINI <0.30 <0.30 <0.30   BNP (last 3 results)  Recent Labs  12/22/13 1858 12/24/13 0500 12/25/13 0600  PROBNP 4519.0* 2064.0* 3606.0*   CBG: No results found for this basename: GLUCAP,  in the last 168 hours  Recent Results (from the past 240 hour(s))  URINE CULTURE     Status: None   Collection Time    12/22/13  7:08 PM      Result Value Ref Range Status   Specimen Description URINE, CATHETERIZED   Final   Special Requests NONE   Final   Culture  Setup Time     Final   Value: 12/23/2013 15:31     Performed at [facility] Count     Final   Value: >=100,000 COLONIES/ML     Performed at [facility]   Culture     Final   Value: YEAST     Performed at [facility]   Report Status 12/25/2013 FINAL   Final     Studies: Dg Abd 1 View  12/25/2013   CLINICAL DATA:  Abdominal pain. Evaluate partial small bowel obstruction.  EXAM: ABDOMEN - 1 VIEW  COMPARISON:  12/24/2013.  FINDINGS: AP supine radiograph demonstrates normal caliber the visualized small large bowel. Distal colonic gas can be seen in the rectum. Little change compared with yesterday's examination. No decubitus exam to evaluate for air-fluid levels or free air. Stable BILATERAL hip instrumentation.  IMPRESSION: Grossly unchanged bowel gas pattern.  No gross bowel obstruction.   Electronically Signed   By: Davonna Belling M.D.   On: 12/25/2013 08:45   Dg Abd 2 Views  12/24/2013   CLINICAL DATA:  Bowel obstruction  EXAM: ABDOMEN - 2 VIEW  COMPARISON:  December 23, 2013  FINDINGS:  Supine and left lateral decubitus images were obtained. Several loops of borderline dilated small bowel remain with scattered air-fluid levels. No free air is appreciable.  There is a total hip prosthesis in the right. There is postoperative change in the left proximal femur with resorption of the left femoral head.  IMPRESSION: The bowel gas pattern is consistent with ileus or a degree of partial obstruction. There is very little change compared to 1 day prior. No free air seen.   Electronically Signed   By: Bretta Bang M.D.   On: 12/24/2013 08:46    Scheduled Meds: . acyclovir  200 mg Oral Daily  . antiseptic oral rinse  7 mL Mouth Rinse q12n4p  . calcium-vitamin D  1 tablet Oral Daily  . chlorhexidine  15 mL Mouth Rinse BID  . DULoxetine  30 mg Oral Daily  . furosemide  20 mg Intravenous BID  . gabapentin  100 mg Oral TID  . heparin  5,000 Units Subcutaneous 3 times per day  . isosorbide mononitrate  30 mg Oral Daily  . levothyroxine  12.5 mcg Intravenous Daily  . metoprolol  2.5 mg Intravenous 3 times per day  . mometasone-formoterol  2 puff Inhalation BID  . pantoprazole (PROTONIX) IV  80 mg Intravenous Q24H  . piperacillin-tazobactam  3.375 g Intravenous Q8H  . senna-docusate  1 tablet Oral BID  . tiotropium  18 mcg Inhalation Daily   Continuous Infusions:   Principal Problem:   SBO (small bowel obstruction) Active Problems:   Acute on chronic diastolic heart failure   Chronic diastolic CHF (congestive heart failure)   HCAP (healthcare-associated pneumonia)   UTI (urinary tract infection)   Acute encephalopathy   Ileus of unspecified type    Time spent: 35 mins    [facility] Community HospitalHOMPSON,DANIEL MD [facility] Pager [phone]. If 7PM-7AM, please contact night-coverage at [email or web address], password [email or web address] 12/25/2013, 4:45 PM  LOS: 3 days

## 2013-12-26 ENCOUNTER — Inpatient Hospital Stay (HOSPITAL_COMMUNITY): Payer: Medicare Other

## 2013-12-26 LAB — CBC
HCT: 39.8 % (ref 36.0–46.0)
HEMOGLOBIN: 13.1 g/dL (ref 12.0–15.0)
MCH: 31.3 pg (ref 26.0–34.0)
MCHC: 32.9 g/dL (ref 30.0–36.0)
MCV: 95 fL (ref 78.0–100.0)
Platelets: 496 10*3/uL — ABNORMAL HIGH (ref 150–400)
RBC: 4.19 MIL/uL (ref 3.87–5.11)
RDW: 14.8 % (ref 11.5–15.5)
WBC: 14.1 10*3/uL — AB (ref 4.0–10.5)

## 2013-12-26 LAB — BASIC METABOLIC PANEL
ANION GAP: 20 — AB (ref 5–15)
BUN: 23 mg/dL (ref 6–23)
CHLORIDE: 96 meq/L (ref 96–112)
CO2: 27 mEq/L (ref 19–32)
Calcium: 9.3 mg/dL (ref 8.4–10.5)
Creatinine, Ser: 1.14 mg/dL — ABNORMAL HIGH (ref 0.50–1.10)
GFR calc Af Amer: 46 mL/min — ABNORMAL LOW (ref >90)
GFR calc non Af Amer: 40 mL/min — ABNORMAL LOW (ref >90)
Glucose, Bld: 87 mg/dL (ref 70–99)
Potassium: 3.2 mEq/L — ABNORMAL LOW (ref 3.7–5.3)
SODIUM: 143 meq/L (ref 137–147)

## 2013-12-26 MED ORDER — LEVOTHYROXINE SODIUM 25 MCG PO TABS
25.0000 ug | ORAL_TABLET | Freq: Every day | ORAL | Status: DC
Start: 2013-12-27 — End: 2013-12-28
  Administered 2013-12-27 – 2013-12-28 (×2): 25 ug via ORAL
  Filled 2013-12-26 (×3): qty 1

## 2013-12-26 MED ORDER — MEMANTINE HCL ER 7 MG PO CP24
14.0000 mg | ORAL_CAPSULE | Freq: Every day | ORAL | Status: DC
  Administered 2013-12-26 – 2013-12-28 (×3): 14 mg via ORAL
  Filled 2013-12-26 (×3): qty 2

## 2013-12-26 MED ORDER — POTASSIUM CHLORIDE 10 MEQ/100ML IV SOLN
10.0000 meq | INTRAVENOUS | Status: AC
  Administered 2013-12-26 (×4): 10 meq via INTRAVENOUS
  Filled 2013-12-26 (×4): qty 100

## 2013-12-26 MED ORDER — LEVOTHYROXINE SODIUM 25 MCG PO TABS: 25.0000 ug | ORAL_TABLET | Freq: Every day | ORAL | Status: DC

## 2013-12-26 NOTE — Progress Notes (Signed)
Patient ID: [name] [name], female   DOB: [birth year], 80 y.o.   MRN: [id number] [facility] Surgery Progress Note:   * No surgery found *  Subjective: Mental status is responsive and somewhat clear. No complaints of pain Objective: Vital signs in last 24 hours: Temp:  [97.5 F (36.4 C)-98.3 F (36.8 C)] 97.5 F (36.4 C) (08/16 0624) Pulse Rate:  [61-88] 85 (08/16 0624) Resp:  [16-17] 16 (08/16 0624) BP: (145-175)/(69-82) 145/69 mmHg (08/16 0624) SpO2:  [95 %-98 %] 98 % (08/16 0624)  Intake/Output from previous day:   Intake/Output this shift:    Physical Exam: Work of breathing is not labored.  Abdomen is nontender to palpation  Lab Results:  Results for orders placed during the hospital encounter of 12/22/13 (from the past 48 hour(s))  CBC     Status: Abnormal   Collection Time    12/25/13  6:00 AM      Result Value Ref Range   WBC 11.8 (*) 4.0 - 10.5 K/uL   RBC 4.26  3.87 - 5.11 MIL/uL   Hemoglobin 13.7  12.0 - 15.0 g/dL   HCT 34.5  34.5 - 17.2 %   MCV 95.8  78.0 - 100.0 fL   MCH 32.2  26.0 - 34.0 pg   MCHC 33.6  30.0 - 36.0 g/dL   RDW 88.3  61.8 - 41.4 %   Platelets 541 (*) 150 - 400 K/uL  BASIC METABOLIC PANEL     Status: Abnormal   Collection Time    12/25/13  6:00 AM      Result Value Ref Range   Sodium 143  137 - 147 mEq/L   Potassium 4.1  3.7 - 5.3 mEq/L   Comment: DELTA CHECK NOTED   Chloride 99  96 - 112 mEq/L   CO2 27  19 - 32 mEq/L   Glucose, Bld 87  70 - 99 mg/dL   BUN 19  6 - 23 mg/dL   Creatinine, Ser 2.06  0.50 - 1.10 mg/dL   Calcium 9.1  8.4 - 58.1 mg/dL   GFR calc non Af Amer 47 (*) >90 mL/min   GFR calc Af Amer 55 (*) >90 mL/min   Comment: (NOTE)     The eGFR has been calculated using the CKD EPI equation.     This calculation has not been validated in all clinical situations.     eGFR's persistently <90 mL/min signify possible Chronic Kidney     Disease.   Anion gap 17 (*) 5 - 15  PRO B NATRIURETIC PEPTIDE     Status: Abnormal   Collection Time    12/25/13  6:00 AM      Result Value Ref Range   Pro B Natriuretic peptide (BNP) 3606.0 (*) 0 - 450 pg/mL  BASIC METABOLIC PANEL     Status: Abnormal   Collection Time    12/26/13  6:00 AM      Result Value Ref Range   Sodium 143  137 - 147 mEq/L   Potassium 3.2 (*) 3.7 - 5.3 mEq/L   Comment: DELTA CHECK NOTED   Chloride 96  96 - 112 mEq/L   CO2 27  19 - 32 mEq/L   Glucose, Bld 87  70 - 99 mg/dL   BUN 23  6 - 23 mg/dL   Creatinine, Ser 9.29 (*) 0.50 - 1.10 mg/dL   Calcium 9.3  8.4 - 73.6 mg/dL   GFR calc non Af Amer 40 (*) >90 mL/min  GFR calc Af Amer 46 (*) >90 mL/min   Comment: (NOTE)     The eGFR has been calculated using the CKD EPI equation.     This calculation has not been validated in all clinical situations.     eGFR's persistently <90 mL/min signify possible Chronic Kidney     Disease.   Anion gap 20 (*) 5 - 15  CBC     Status: Abnormal   Collection Time    12/26/13  6:00 AM      Result Value Ref Range   WBC 14.1 (*) 4.0 - 10.5 K/uL   RBC 4.19  3.87 - 5.11 MIL/uL   Hemoglobin 13.1  12.0 - 15.0 g/dL   HCT 39.8  36.0 - 46.0 %   MCV 95.0  78.0 - 100.0 fL   MCH 31.3  26.0 - 34.0 pg   MCHC 32.9  30.0 - 36.0 g/dL   RDW 14.8  11.5 - 15.5 %   Platelets 496 (*) 150 - 400 K/uL    Radiology/Results: Dg Abd 1 View  12/25/2013   CLINICAL DATA:  Abdominal pain. Evaluate partial small bowel obstruction.  EXAM: ABDOMEN - 1 VIEW  COMPARISON:  12/24/2013.  FINDINGS: AP supine radiograph demonstrates normal caliber the visualized small large bowel. Distal colonic gas can be seen in the rectum. Little change compared with yesterday's examination. No decubitus exam to evaluate for air-fluid levels or free air. Stable BILATERAL hip instrumentation.  IMPRESSION: Grossly unchanged bowel gas pattern.  No gross bowel obstruction.   Electronically Signed   By: Rolla Flatten M.D.   On: 12/25/2013 08:45   Dg Abd 2 Views  12/24/2013   CLINICAL DATA:  Bowel obstruction   EXAM: ABDOMEN - 2 VIEW  COMPARISON:  December 23, 2013  FINDINGS: Supine and left lateral decubitus images were obtained. Several loops of borderline dilated small bowel remain with scattered air-fluid levels. No free air is appreciable.  There is a total hip prosthesis in the right. There is postoperative change in the left proximal femur with resorption of the left femoral head.  IMPRESSION: The bowel gas pattern is consistent with ileus or a degree of partial obstruction. There is very little change compared to 1 day prior. No free air seen.   Electronically Signed   By: Lowella Grip M.D.   On: 12/24/2013 08:46    Anti-infectives: Anti-infectives   Start     Dose/Rate Route Frequency Ordered Stop   12/23/13 2000  vancomycin (VANCOCIN) IVPB 750 mg/150 ml premix  Status:  Discontinued     750 mg 150 mL/hr over 60 Minutes Intravenous Every 24 hours 12/22/13 1947 12/23/13 1640   12/23/13 1000  acyclovir (ZOVIRAX) 200 MG capsule 200 mg     200 mg Oral Daily 12/22/13 2336     12/23/13 0200  piperacillin-tazobactam (ZOSYN) IVPB 3.375 g  Status:  Discontinued     3.375 g 12.5 mL/hr over 240 Minutes Intravenous Every 8 hours 12/22/13 2336 12/26/13 0736   12/22/13 2000  vancomycin (VANCOCIN) IVPB 1000 mg/200 mL premix     1,000 mg 200 mL/hr over 60 Minutes Intravenous  Once 12/22/13 1941 12/22/13 2105   12/22/13 1930  ceFEPIme (MAXIPIME) 1 g in dextrose 5 % 50 mL IVPB     1 g 100 mL/hr over 30 Minutes Intravenous  Once 12/22/13 1901 12/22/13 2004      Assessment/Plan: Problem List: Patient Active Problem List   Diagnosis Date Noted  . Ileus of unspecified  type 12/25/2013  . SBO (small bowel obstruction) 12/23/2013  . UTI (urinary tract infection) 12/23/2013  . Acute encephalopathy 12/23/2013  . Acute on chronic diastolic heart failure 57/49/3552  . HCAP (healthcare-associated pneumonia) 12/22/2013  . Chronic venous insufficiency 12/17/2013  . Rib pain on right side 12/14/2013  .  Pneumonia involving right lung 12/14/2013  . Laceration of right side of forehead with complication 17/47/1595  . Fall at nursing home 12/04/2013  . Edema 11/23/2013  . Rash and nonspecific skin eruption 11/16/2013  . Unspecified constipation 10/19/2013  . Chronic diastolic CHF (congestive heart failure) 10/15/2013  . Bradycardia 07/02/2013  . UTI (lower urinary tract infection) 02/07/2013  . Pain in left hip 12/25/2012  . HTN (hypertension) 12/15/2012  . DNR (do not resuscitate) 10/16/2012  . Herpes genitalis in women 10/16/2012  . Unspecified chronic bronchitis 10/16/2012  . Dermatitis 09/25/2012  . Unspecified hypothyroidism 08/25/2012  . CAD (coronary artery disease) of artery bypass graft 08/25/2012  . Skin lesion of face 07/31/2012  . UGI bleed 05/05/2012  . Renal failure (ARF), acute on chronic 11/01/2011  . Chronic pain 11/01/2011  . GERD (gastroesophageal reflux disease)   . Osteoporosis   . Syncope   . Dementia   . Anemia   . Parkinson disease   . Neuropathy   . Hyperlipidemia   . Anxiety   . Depression     Xrays reviewed and there is gas in the small bowel and throughout the colon consistent with a resolving ileus.   * No surgery found *    LOS: 4 days   Matt B. Hassell Done, MD, [facility] [phone] beeper [phone]  12/26/2013 8:35 AM

## 2013-12-26 NOTE — Progress Notes (Signed)
[facility] PROGRESS NOTE  [name] [name] NFA:[id number] DOB: 1917/08/21 DOA: 12/22/2013 PCP: Kimber RelicGREEN, ARTHUR G, MD  Assessment/Plan: #1 probable partial small bowel obstruction versus ileus Abdominal x-rays. Patient had presented with complaints of abdominal pain and inability to keep food down. Patient denies any nausea or emesis. Patient with 2 bowel movements yesterday, and bowel movement today. Abdominal x-ray today with resolution. Will place patient on clear liquids. Continue supportive care,antiemetics, pain management. Mobilize. CCS ff.   #2 dyspnea with left pleural effusion/probable acute on chronic Diastolic heart failure. Patient was noted to have elevated proBNP of 4519 on admission. Patient noted with some crackles on examination on admission, which has since resolved. Cardiac enzymes negative x3. 2-D echo with EF of 55-60%. No wall motion abnormalities. CT of the chest with bilateral effusions and associated bibasilar atelectasis. Patchy groundglass opacities in both lungs suggesting mild edema. No consolidation, no suspicious pulmonary nodule. Mild cardiomegaly and atherosclerosis. Patient likely does not have a pneumonia. D/C IV diuretics. Strict is and os. Daily weights.  #3 Candiduria/ ??UTI Urine cultures c/w yeast . D/C  IV Zosyn.  #4 leukocytosis Likely secondary to problem #3. Urine cultures with yeast. WBC trending down. D/C  IV Zosyn. Follow.  #5 dementia Resume home regimen of Namenda tommorrow.  #6 acute on chronic kidney disease stage II to III Baseline creatinine was 0.9-1.0. Renal function improved with diuretics. Hold IV Lasix today.   #7 nausea and emesis Likely secondary to problem number of 1. Resolved.  #8 COPD Stable. Continue Spiriva and duoneb.  #9 hypertension Continue IV Lopressor.   #10 hypokalemia Likely secondary to diureses. Replete.  #11 coronary artery disease Stable. Cardiac enzymes negative x3. 2-D echo with EF of 55-60% with no  wall motion abnormalities. Continue IV Lopressor.  #12 depression Continue Cymbalta.  #13 acute encephalopathy Likely secondary to urinary tract infection in the setting of dementia. Urine cultures with yeast. D/C IV antibiotics. Follow.  #14 prophylaxis Protonix for GI prophylaxis. Heparin for DVT prophylaxis.  Code Status: DO NOT RESUSCITATE Family Communication: Updated brother at bedside. Disposition Plan: Back to skilled nursing facility when tolerating oral intake and medically stable.   Consultants:  None  Procedures:  Abdominal series 12/23/2013,12/25/13, 12/26/2013   CT chest 12/23/2013  2 d echo 12/24/13  Antibiotics:  IV vancomycin 12/22/2013>>>> 12/23/2013  IV Zosyn 12/22/2013>>>>>12/25/13  HPI/Subjective: Patient denies any nausea, no emesis, no abdominal pain. Patient with 2 bowel movements yesterday and one bowel movement today. No complaints.  Objective: Filed Vitals:   12/26/13 0624  BP: 145/69  Pulse: 85  Temp: 97.5 F (36.4 C)  Resp: 16    Intake/Output Summary (Last 24 hours) at 12/26/13 1141 Last data filed at 12/25/13 1900  Gross per 24 hour  Intake      0 ml  Output      0 ml  Net      0 ml   Filed Weights   12/24/13 0526 12/25/13 0632 12/26/13 0857  Weight: 65.7 kg (144 lb 13.5 oz) 65.2 kg (143 lb 11.8 oz) 61.8 kg (136 lb 3.9 oz)    Exam:   General:  NAD  Cardiovascular: RRR  Respiratory: CTAB anterior lung [place].  Abdomen: Soft, nontender, nondistended, hypoactive bowel sounds.  Musculoskeletal: No clubbing cyanosis or edema  Data Reviewed: Basic Metabolic Panel:  Recent Labs Lab 12/22/13 2022 12/23/13 0528 12/24/13 0500 12/24/13 0552 12/25/13 0600 12/26/13 0600  NA 134* 135* 140  --  143 143  K 4.0 3.6*  3.1*  --  4.1 3.2*  CL 96 95* 99  --  99 96  CO2 23 23 24   --  27 27  GLUCOSE 130* 88 66*  --  87 87  BUN 29* 30* 26*  --  19 23  CREATININE 1.41* 1.43* 1.20*  --  0.99 1.14*  CALCIUM 9.2 9.0 8.7  --  9.1  9.3  MG  --   --   --  2.0  --   --    Liver Function Tests:  Recent Labs Lab 12/22/13 2022  AST 16  ALT 12  ALKPHOS 272*  BILITOT 0.5  PROT 7.0  ALBUMIN 2.6*    Recent Labs Lab 12/22/13 1858  LIPASE 43   No results found for this basename: AMMONIA,  in the last 168 hours CBC:  Recent Labs Lab 12/22/13 1745 12/22/13 1759 12/23/13 0528 12/24/13 0500 12/25/13 0600 12/26/13 0600  WBC 17.5*  --  16.7* 12.4* 11.8* 14.1*  NEUTROABS 14.6*  --   --   --   --   --   HGB 13.0 14.6 11.9* 11.9* 13.7 13.1  HCT 38.9 43.0 35.7* 36.9 40.8 39.8  MCV 94.4  --  94.2 95.6 95.8 95.0  PLT 542*  --  490* 503* 541* 496*   Cardiac Enzymes:  Recent Labs Lab 12/22/13 2337 12/23/13 0525 12/23/13 1120  TROPONINI <0.30 <0.30 <0.30   BNP (last 3 results)  Recent Labs  12/22/13 1858 12/24/13 0500 12/25/13 0600  PROBNP 4519.0* 2064.0* 3606.0*   CBG: No results found for this basename: GLUCAP,  in the last 168 hours  Recent Results (from the past 240 hour(s))  URINE CULTURE     Status: None   Collection Time    12/22/13  7:08 PM      Result Value Ref Range Status   Specimen Description URINE, CATHETERIZED   Final   Special Requests NONE   Final   Culture  Setup Time     Final   Value: 12/23/2013 15:31     Performed at [facility] Count     Final   Value: >=100,000 COLONIES/ML     Performed at [facility]   Culture     Final   Value: YEAST     Performed at [facility]   Report Status 12/25/2013 FINAL   Final     Studies: Dg Abd 1 View  12/25/2013   CLINICAL DATA:  Abdominal pain. Evaluate partial small bowel obstruction.  EXAM: ABDOMEN - 1 VIEW  COMPARISON:  12/24/2013.  FINDINGS: AP supine radiograph demonstrates normal caliber the visualized small large bowel. Distal colonic gas can be seen in the rectum. Little change compared with yesterday's examination. No decubitus exam to evaluate for air-fluid levels or free air. Stable  BILATERAL hip instrumentation.  IMPRESSION: Grossly unchanged bowel gas pattern.  No gross bowel obstruction.   Electronically Signed   By: Davonna Belling M.D.   On: 12/25/2013 08:45   Dg Abd 2 Views  12/26/2013   CLINICAL DATA:  Abdominal distension  EXAM: ABDOMEN - 2 VIEW  COMPARISON:  CT 12/25/2013  FINDINGS: Gas-filled loops of large and small bowel which are not pathologically dilated. There is gas in the rectum. No intraperitoneal free air on the lateral decubitus projection.  IMPRESSION: No evidence of bowel obstruction.  No free air.   Electronically Signed   By: Genevive Bi M.D.   On: 12/26/2013 09:05  Scheduled Meds: . acyclovir  200 mg Oral Daily  . antiseptic oral rinse  7 mL Mouth Rinse q12n4p  . calcium-vitamin D  1 tablet Oral Daily  . chlorhexidine  15 mL Mouth Rinse BID  . DULoxetine  30 mg Oral Daily  . gabapentin  100 mg Oral TID  . heparin  5,000 Units Subcutaneous 3 times per day  . isosorbide mononitrate  30 mg Oral Daily  . levothyroxine  12.5 mcg Intravenous Daily  . metoprolol  2.5 mg Intravenous 3 times per day  . mometasone-formoterol  2 puff Inhalation BID  . pantoprazole (PROTONIX) IV  80 mg Intravenous Q24H  . potassium chloride  10 mEq Intravenous Q1 Hr x 4  . senna-docusate  1 tablet Oral BID  . tiotropium  18 mcg Inhalation Daily   Continuous Infusions:   Principal Problem:   SBO (small bowel obstruction) Active Problems:   Acute on chronic diastolic heart failure   Chronic diastolic CHF (congestive heart failure)   HCAP (healthcare-associated pneumonia)   UTI (urinary tract infection)   Acute encephalopathy   Ileus of unspecified type    Time spent: 35 mins    [facility] MD [facility] Pager [phone]. If 7PM-7AM, please contact night-coverage at [email or web address], password [facility] 12/26/2013, 11:41 AM  LOS: 4 days

## 2013-12-27 LAB — BASIC METABOLIC PANEL
Anion gap: 13 (ref 5–15)
BUN: 23 mg/dL (ref 6–23)
CO2: 25 meq/L (ref 19–32)
Calcium: 8.9 mg/dL (ref 8.4–10.5)
Chloride: 103 mEq/L (ref 96–112)
Creatinine, Ser: 1 mg/dL (ref 0.50–1.10)
GFR calc Af Amer: 54 mL/min — ABNORMAL LOW (ref >90)
GFR calc non Af Amer: 47 mL/min — ABNORMAL LOW (ref >90)
GLUCOSE: 94 mg/dL (ref 70–99)
POTASSIUM: 3.2 meq/L — AB (ref 3.7–5.3)
Sodium: 141 mEq/L (ref 137–147)

## 2013-12-27 LAB — CBC
HEMATOCRIT: 37.5 % (ref 36.0–46.0)
HEMOGLOBIN: 12.5 g/dL (ref 12.0–15.0)
MCH: 31.4 pg (ref 26.0–34.0)
MCHC: 33.3 g/dL (ref 30.0–36.0)
MCV: 94.2 fL (ref 78.0–100.0)
Platelets: 470 10*3/uL — ABNORMAL HIGH (ref 150–400)
RBC: 3.98 MIL/uL (ref 3.87–5.11)
RDW: 15 % (ref 11.5–15.5)
WBC: 13.9 10*3/uL — ABNORMAL HIGH (ref 4.0–10.5)

## 2013-12-27 MED ORDER — FUROSEMIDE 20 MG PO TABS
20.0000 mg | ORAL_TABLET | Freq: Every day | ORAL | Status: DC
  Administered 2013-12-27 – 2013-12-28 (×2): 20 mg via ORAL
  Filled 2013-12-27 (×2): qty 1

## 2013-12-27 MED ORDER — AMLODIPINE BESYLATE 10 MG PO TABS
10.0000 mg | ORAL_TABLET | Freq: Every day | ORAL | Status: DC
  Administered 2013-12-28: 10 mg via ORAL
  Filled 2013-12-27: qty 1

## 2013-12-27 MED ORDER — POTASSIUM CHLORIDE CRYS ER 20 MEQ PO TBCR
40.0000 meq | EXTENDED_RELEASE_TABLET | ORAL | Status: AC
  Administered 2013-12-27 (×2): 40 meq via ORAL
  Filled 2013-12-27 (×3): qty 2

## 2013-12-27 NOTE — Progress Notes (Signed)
Clinical Social Work  Patient was discussed during progression meeting and MD reports patient possibly ready to DC tomorrow. CSW left a message with SNF and updated facility on patient's DC plans. CSW will continue to follow.  Tierra BonitaHolly Jaime [place], [place] [phone]

## 2013-12-27 NOTE — Progress Notes (Signed)
[facility] PROGRESS NOTE  [name] [name] ZOX:[id number] DOB: 1917/11/29 DOA: 12/22/2013 PCP: Kimber RelicGREEN, ARTHUR G, MD  Assessment/Plan: #1 probable partial small bowel obstruction versus ileus Abdominal x-rays. Patient had presented with complaints of abdominal pain and inability to keep food down. Patient denies any nausea or emesis. Patient with 2 bowel movements yesterday, and bowel movement today. Abdominal x-ray today with resolution. Patient tolerating full liquids. Advance to mechanical soft diet. Continue supportive care,antiemetics, pain management. Mobilize. CCS ff.   #2 dyspnea with left pleural effusion/probable acute on chronic Diastolic heart failure. Patient was noted to have elevated proBNP of 4519 on admission. Patient noted with some crackles on examination on admission, which has since resolved. Cardiac enzymes negative x3. 2-D echo with EF of 55-60%. No wall motion abnormalities. CT of the chest with bilateral effusions and associated bibasilar atelectasis. Patchy groundglass opacities in both lungs suggesting mild edema. No consolidation, no suspicious pulmonary nodule. Mild cardiomegaly and atherosclerosis. Resume home regimen of lasix. Continue imdur. Strict is and os. Daily weights.  #3 Candiduria/ ??UTI Urine cultures c/w yeast . D/C  IV Zosyn.  #4 leukocytosis Likely secondary to problem #3. Urine cultures with yeast. WBC trending down. D/C  IV Zosyn. Follow.  #5 dementia Resume home regimen of Namenda tommorrow.  #6 acute on chronic kidney disease stage II to III Baseline creatinine was 0.9-1.0. Renal function improved with diuretics. Resume home regimen lasix.  #7 nausea and emesis Likely secondary to problem number of 1. Resolved.  #8 COPD Stable. Continue Spiriva and duoneb.  #9 hypertension Change IV Lopressor to oral home regimen of norvasc tomorrow. Continue imdur.   #10 hypokalemia Likely secondary to diureses. Replete.  #11 coronary artery  disease Stable. Cardiac enzymes negative x3. 2-D echo with EF of 55-60% with no wall motion abnormalities. Change IV Lopressor to oral home regimen.  #12 depression Continue Cymbalta.  #13 acute encephalopathy Likely secondary to urinary tract infection in the setting of dementia. Urine cultures with yeast. D/C'd IV antibiotics. Follow.  #14 prophylaxis Protonix for GI prophylaxis. Heparin for DVT prophylaxis.  Code Status: DO NOT RESUSCITATE Family Communication: Updated brother at bedside. Disposition Plan: Back to skilled nursing facility when tolerating oral intake and medically stable, in 1-2 days.   Consultants:  None  Procedures:  Abdominal series 12/23/2013,12/25/13, 12/26/2013   CT chest 12/23/2013  2 d echo 12/24/13  Antibiotics:  IV vancomycin 12/22/2013>>>> 12/23/2013  IV Zosyn 12/22/2013>>>>>12/25/13  HPI/Subjective: Patient denies any nausea, no emesis, no abdominal pain. Patient with bowel movements. Patient tolerating current diet.  Objective: Filed Vitals:   12/27/13 1338  BP: 140/59  Pulse: 62  Temp: 98 F (36.7 C)  Resp: 18    Intake/Output Summary (Last 24 hours) at 12/27/13 1713 Last data filed at 12/27/13 1446  Gross per 24 hour  Intake    480 ml  Output      0 ml  Net    480 ml   Filed Weights   12/25/13 0632 12/26/13 0857 12/27/13 0507  Weight: 65.2 kg (143 lb 11.8 oz) 61.8 kg (136 lb 3.9 oz) 64.2 kg (141 lb 8.6 oz)    Exam:   General:  NAD  Cardiovascular: RRR  Respiratory: CTAB anterior lung Dobratz.  Abdomen: Soft, nontender, nondistended, hypoactive bowel sounds.  Musculoskeletal: No clubbing cyanosis or edema  Data Reviewed: Basic Metabolic Panel:  Recent Labs Lab 12/23/13 0528 12/24/13 0500 12/24/13 0552 12/25/13 0600 12/26/13 0600 12/27/13 0503  NA 135* 140  --  143  143 141  K 3.6* 3.1*  --  4.1 3.2* 3.2*  CL 95* 99  --  99 96 103  CO2 23 24  --  27 27 25   GLUCOSE 88 66*  --  87 87 94  BUN 30* 26*  --   19 23 23   CREATININE 1.43* 1.20*  --  0.99 1.14* 1.00  CALCIUM 9.0 8.7  --  9.1 9.3 8.9  MG  --   --  2.0  --   --   --    Liver Function Tests:  Recent Labs Lab 12/22/13 2022  AST 16  ALT 12  ALKPHOS 272*  BILITOT 0.5  PROT 7.0  ALBUMIN 2.6*    Recent Labs Lab 12/22/13 1858  LIPASE 43   No results found for this basename: AMMONIA,  in the last 168 hours CBC:  Recent Labs Lab 12/22/13 1745  12/23/13 0528 12/24/13 0500 12/25/13 0600 12/26/13 0600 12/27/13 0503  WBC 17.5*  --  16.7* 12.4* 11.8* 14.1* 13.9*  NEUTROABS 14.6*  --   --   --   --   --   --   HGB 13.0  < > 11.9* 11.9* 13.7 13.1 12.5  HCT 38.9  < > 35.7* 36.9 40.8 39.8 37.5  MCV 94.4  --  94.2 95.6 95.8 95.0 94.2  PLT 542*  --  490* 503* 541* 496* 470*  < > = values in this interval not displayed. Cardiac Enzymes:  Recent Labs Lab 12/22/13 2337 12/23/13 0525 12/23/13 1120  TROPONINI <0.30 <0.30 <0.30   BNP (last 3 results)  Recent Labs  12/22/13 1858 12/24/13 0500 12/25/13 0600  PROBNP 4519.0* 2064.0* 3606.0*   CBG: No results found for this basename: GLUCAP,  in the last 168 hours  Recent Results (from the past 240 hour(s))  URINE CULTURE     Status: None   Collection Time    12/22/13  7:08 PM      Result Value Ref Range Status   Specimen Description URINE, CATHETERIZED   Final   Special Requests NONE   Final   Culture  Setup Time     Final   Value: 12/23/2013 15:31     Performed at [facility] Count     Final   Value: >=100,000 COLONIES/ML     Performed at [facility]   Culture     Final   Value: YEAST     Performed at [facility]   Report Status 12/25/2013 FINAL   Final     Studies: Dg Abd 2 Views  12/26/2013   CLINICAL DATA:  Abdominal distension  EXAM: ABDOMEN - 2 VIEW  COMPARISON:  CT 12/25/2013  FINDINGS: Gas-filled loops of large and small bowel which are not pathologically dilated. There is gas in the rectum. No intraperitoneal free  air on the lateral decubitus projection.  IMPRESSION: No evidence of bowel obstruction.  No free air.   Electronically Signed   By: Genevive Bi M.D.   On: 12/26/2013 09:05    Scheduled Meds: . acyclovir  200 mg Oral Daily  . antiseptic oral rinse  7 mL Mouth Rinse q12n4p  . calcium-vitamin D  1 tablet Oral Daily  . chlorhexidine  15 mL Mouth Rinse BID  . DULoxetine  30 mg Oral Daily  . furosemide  20 mg Oral Daily  . gabapentin  100 mg Oral TID  . heparin  5,000 Units Subcutaneous 3 times per day  .  isosorbide mononitrate  30 mg Oral Daily  . levothyroxine  25 mcg Oral QAC breakfast  . Memantine HCl ER  14 mg Oral Daily  . metoprolol  2.5 mg Intravenous 3 times per day  . mometasone-formoterol  2 puff Inhalation BID  . pantoprazole (PROTONIX) IV  80 mg Intravenous Q24H  . senna-docusate  1 tablet Oral BID  . tiotropium  18 mcg Inhalation Daily   Continuous Infusions:   Principal Problem:   SBO (small bowel obstruction) Active Problems:   Acute on chronic diastolic heart failure   Chronic diastolic CHF (congestive heart failure)   HCAP (healthcare-associated pneumonia)   UTI (urinary tract infection)   Acute encephalopathy   Ileus of unspecified type    Time spent: 35 mins    [facility] Meriter MD [facility] Pager [phone]. If 7PM-7AM, please contact night-coverage at [email or web address], password [facility] 12/27/2013, 5:13 PM  LOS: 5 days

## 2013-12-27 NOTE — Progress Notes (Signed)
Subjective: No abdominal pain.  Bowels moving.  Objective: Vital signs in last 24 hours: Temp:  [98.1 F (36.7 C)-98.4 F (36.9 C)] 98.4 F (36.9 C) (08/17 0507) Pulse Rate:  [47-89] 47 (08/17 0507) Resp:  [16-20] 20 (08/17 0507) BP: (134-161)/(46-83) 161/53 mmHg (08/17 0507) SpO2:  [95 %-96 %] 95 % (08/17 0850) Weight:  [141 lb 8.6 oz (64.2 kg)] 141 lb 8.6 oz (64.2 kg) (08/17 0507) Last BM Date: 12/27/13  Intake/Output from previous day:   Intake/Output this shift: Total I/O In: 240 [P.O.:240] Out: -   PE: General- In NAD Abdomen-soft, non-tender, not distended  Lab Results:   Recent Labs  Jan 20, 2014 0600 12/27/13 0503  WBC 14.1* 13.9*  HGB 13.1 12.5  HCT 39.8 37.5  PLT 496* 470*   BMET  Recent Labs  2014/01/20 0600 12/27/13 0503  NA 143 141  K 3.2* 3.2*  CL 96 103  CO2 27 25  GLUCOSE 87 94  BUN 23 23  CREATININE 1.14* 1.00  CALCIUM 9.3 8.9   PT/INR No results found for this basename: LABPROT, INR,  in the last 72 hours Comprehensive Metabolic Panel:    Component Value Date/Time   NA 141 12/27/2013 0503   NA 143 Jan 20, 2014 0600   NA 140 11/29/2013   NA 138 10/18/2013   K 3.2* 12/27/2013 0503   K 3.2* 01/20/14 0600   CL 103 12/27/2013 0503   CL 96 20-Jan-2014 0600   CO2 25 12/27/2013 0503   CO2 27 01/20/2014 0600   BUN 23 12/27/2013 0503   BUN 23 01-20-14 0600   BUN 14 11/29/2013   BUN 25* 10/18/2013   CREATININE 1.00 12/27/2013 0503   CREATININE 1.14* January 20, 2014 0600   CREATININE 0.9 11/29/2013   CREATININE 1.0 10/18/2013   GLUCOSE 94 12/27/2013 0503   GLUCOSE 87 Jan 20, 2014 0600   CALCIUM 8.9 12/27/2013 0503   CALCIUM 9.3 20-Jan-2014 0600   AST 16 12/22/2013 2022   AST 20 12/04/2013 0631   ALT 12 12/22/2013 2022   ALT 13 12/04/2013 0631   ALKPHOS 272* 12/22/2013 2022   ALKPHOS 96 12/04/2013 0631   BILITOT 0.5 12/22/2013 2022   BILITOT 0.3 12/04/2013 0631   PROT 7.0 12/22/2013 2022   PROT 6.6 12/04/2013 0631   ALBUMIN 2.6* 12/22/2013 2022   ALBUMIN 2.8*  12/04/2013 0631     Studies/Results: Dg Abd 2 Views  20-Jan-2014   CLINICAL DATA:  Abdominal distension  EXAM: ABDOMEN - 2 VIEW  COMPARISON:  CT 12/25/2013  FINDINGS: Gas-filled loops of large and small bowel which are not pathologically dilated. There is gas in the rectum. No intraperitoneal free air on the lateral decubitus projection.  IMPRESSION: No evidence of bowel obstruction.  No free air.   Electronically Signed   By: Genevive Bi M.D.   On: January 20, 2014 09:05    Anti-infectives: Anti-infectives   Start     Dose/Rate Route Frequency Ordered Stop   12/23/13 2000  vancomycin (VANCOCIN) IVPB 750 mg/150 ml premix  Status:  Discontinued     750 mg 150 mL/hr over 60 Minutes Intravenous Every 24 hours 12/22/13 1947 12/23/13 1640   12/23/13 1000  acyclovir (ZOVIRAX) 200 MG capsule 200 mg     200 mg Oral Daily 12/22/13 2336     12/23/13 0200  piperacillin-tazobactam (ZOSYN) IVPB 3.375 g  Status:  Discontinued     3.375 g 12.5 mL/hr over 240 Minutes Intravenous Every 8 hours 12/22/13 2336 January 20, 2014 0736   12/22/13 2000  vancomycin (VANCOCIN) IVPB 1000 mg/200 mL premix     1,000 mg 200 mL/hr over 60 Minutes Intravenous  Once 12/22/13 1941 12/22/13 2105   12/22/13 1930  ceFEPIme (MAXIPIME) 1 g in dextrose 5 % 50 mL IVPB     1 g 100 mL/hr over 30 Minutes Intravenous  Once 12/22/13 1901 12/22/13 2004      Assessment Principal Problem:   Partial SBO (small bowel obstruction) vs ileus-resolving. Active Problems:   Chronic diastolic CHF (congestive heart failure)   HCAP (healthcare-associated pneumonia)   UTI (urinary tract infection)   Acute encephalopathy   Acute on chronic diastolic heart failure   Ileus of unspecified type    LOS: 5 days   Plan: No need for surgical intervention.  Could advance diet as tolerated.  Please call if we can be of further assistance.   Leslieanne Cobarrubias J 12/27/2013

## 2013-12-28 DIAGNOSIS — B3749 Other urogenital candidiasis: Secondary | ICD-10-CM

## 2013-12-28 LAB — BASIC METABOLIC PANEL
Anion gap: 13 (ref 5–15)
BUN: 23 mg/dL (ref 6–23)
CALCIUM: 8.8 mg/dL (ref 8.4–10.5)
CO2: 23 meq/L (ref 19–32)
CREATININE: 0.95 mg/dL (ref 0.50–1.10)
Chloride: 106 mEq/L (ref 96–112)
GFR calc Af Amer: 58 mL/min — ABNORMAL LOW (ref >90)
GFR calc non Af Amer: 50 mL/min — ABNORMAL LOW (ref >90)
GLUCOSE: 97 mg/dL (ref 70–99)
Potassium: 4.3 mEq/L (ref 3.7–5.3)
SODIUM: 142 meq/L (ref 137–147)

## 2013-12-28 LAB — CBC
HEMATOCRIT: 36 % (ref 36.0–46.0)
Hemoglobin: 11.9 g/dL — ABNORMAL LOW (ref 12.0–15.0)
MCH: 31.6 pg (ref 26.0–34.0)
MCHC: 33.1 g/dL (ref 30.0–36.0)
MCV: 95.5 fL (ref 78.0–100.0)
Platelets: 469 10*3/uL — ABNORMAL HIGH (ref 150–400)
RBC: 3.77 MIL/uL — ABNORMAL LOW (ref 3.87–5.11)
RDW: 15.4 % (ref 11.5–15.5)
WBC: 14.8 10*3/uL — ABNORMAL HIGH (ref 4.0–10.5)

## 2013-12-28 MED ORDER — PANTOPRAZOLE SODIUM 40 MG IV SOLR
40.0000 mg | Freq: Two times a day (BID) | INTRAVENOUS | Status: DC
  Administered 2013-12-28: 40 mg via INTRAVENOUS
  Filled 2013-12-28 (×2): qty 40

## 2013-12-28 MED ORDER — TRAMADOL HCL 50 MG PO TABS: 50.0000 mg | ORAL_TABLET | Freq: Three times a day (TID) | ORAL | Status: AC

## 2013-12-28 NOTE — Progress Notes (Signed)
[facility] Patient & son, Gala RomneyDoug aware. Discharge packet given to RN, Onalee Huaavid. [facility] called for transport.   Lincoln MaxinKelly Jesper Stirewalt, LCSW [facility] Clinical Social Worker cell #: [phone]

## 2013-12-28 NOTE — Discharge Summary (Signed)
Physician Discharge Summary  [name] [name] ZOX:[id number] DOB: November 20, 1917 DOA: 12/22/2013  PCP: Kimber Relic, MD  Admit date: 12/22/2013 Discharge date: 12/28/2013  Time spent: 70 minutes  Recommendations for Outpatient Follow-up:  1. Followup with M.D. at the skilled nursing facility. Patient would need a basic metabolic profile done one week post discharge to followup on electrolytes and renal function.  Discharge Diagnoses:  Principal Problem:   SBO (small bowel obstruction) Active Problems:   Acute on chronic diastolic heart failure   Chronic diastolic CHF (congestive heart failure)   Candida UTI   Acute encephalopathy   Ileus of unspecified type   Discharge Condition: Stable and improved  Diet recommendation: Low-salt diet/mechanical soft  Filed Weights   12/27/13 0507 12/28/13 0500 12/28/13 0938  Weight: 64.2 kg (141 lb 8.6 oz) 61.6 kg (135 lb 12.9 oz) 61.6 kg (135 lb 12.9 oz)    History of present illness:  80 year old female with a history of dementia, chronic diastolic CHF, COPD, hypothyroidism, hypertension, CAD presented with reported lethargy. Due to the patient's dementia, the patient was unable to provide any significant history. All of this history was obtained from review of the medical records and speaking with Fayrene Helper, PA-C. Apparently, the patient was recently treated for pneumonia at her nursing facility with Levaquin. In emergency department, the patient c/o a pain that "my stomach feels terrible. I can't keep anything in my stomach." She denied any headache, chest pain, She complained of some shortness of breath. She was unable to, however she has been short of breath or provide any other significant history. The patient had 2 episodes of emesis in the emergency department. Attempt was made to contact the patient's family without success.  In emergency department, WBC was noted to be 17.5. Urinalysis showed 7-10 WBCs. ProBNP was 4519. EKG shows sinus rhythm  with nonspecific T-wave changes. Chest x-ray showed left lobe opacity suggestive of left pleural effusion. The patient was hemodynamically stable without any respiratory distress. Oxygen saturation was 96% on room air. Point of care creatinine was 1.50   Hospital Course:  #1 probable partial small bowel obstruction versus ileus  On admission abdominal x-rays which were done due to patient's presentation of abdominal pain was consistent with this partial small bowel obstruction versus an ileus. Patient also present with nausea and vomiting and inability to keep food down. She was placed on bowel rest. Placed on supportive care, anti-emetics, pain medication. Patient was followed and had serial abdominal films done. A general surgical consultation was also done and patient was followed by general surgery during the hospitalization. Patient improved clinically on a daily basis. Abdominal x-rays showed resolution of partial small bowel obstruction versus ileus. Patient's electrolytes were repleted. Patient started to pass flatus and started to have bowel movements. Patient did not have any further abdominal pain. Patient did not have any further nausea or vomiting. Patient was subsequently started on a clear liquid diet which she tolerated and was advanced to a mechanical soft diet. Patient improved clinically did not have any further abdominal pain no further nausea no vomiting, discharged in stable and improved condition.  #2 dyspnea with left pleural effusion/probable acute on chronic Diastolic heart failure.  Patient was noted to have elevated proBNP of 4519 on admission. Patient noted with some crackles on examination on admission, which has since resolved. Cardiac enzymes were cycled which were negative x3. 2-D echo with EF of 55-60%. No wall motion abnormalities. CT of the chest with bilateral effusions and  associated bibasilar atelectasis. Patchy groundglass opacities in both lungs suggesting mild edema.  No consolidation, no suspicious pulmonary nodule. Mild cardiomegaly and atherosclerosis. Patient was initially placed on IV Lasix and had good diuresis. Patient was also maintained on Imdur. Patient improved clinically and shortness of breath improved. Patient was subsequently transitioned from IV Lasix to home dose of oral Lasix which she tolerated. Patient be discharged in stable and improved condition. On day of discharge patient was euvolemic. #3 Candiduria/ ??UTI  On admission urinalysis which was done was worrisome for urinary tract infection. Patient was placed empirically on IV antibiotics. Urine cultures came back positive for yeast. IV antibiotics were subsequently discontinued.  #4 leukocytosis  On admission patient was noted to have a leukocytosis. Urinalysis which was initially done was consistent with a UTI patient was placed empirically on IV Zosyn. Patient's leukocytosis trending down. Urine cultures came back with yeast. IV antibiotics were subsequently discontinued. Chest x-ray which was initially concerning for pneumonia however CT of the chest which was done was negative for any consolidation. Antibiotics were subsequently discontinued and patient will followup as outpatient.  #5 dementia  Remained stable throughout the hospitalization. Once patient was tolerating oral intake her home regimen of Namenda was resumed. #6 acute on chronic kidney disease stage II to III  Baseline creatinine was 0.9-1.0. On admission patient was noted to be in acute on chronic kidney disease stage II to 3. Patient was noted to have a creatinine of 1.43. It was felt patient's acute on chronic kidney disease was secondary to prerenal azotemia secondary to acute on chronic diastolic heart failure. Patient was placed on IV Lasix and diuresis. Patient's renal function improved and was back to baseline by day of discharge. Patient was transitioned back to her home regimen of Lasix.  #7 nausea and emesis  Likely  secondary to problem number of 1. Patient was maintained on bowel rest secondary to small bowel obstruction on admission. Patient was monitored and followed. Serial abdominal films were obtained. Once patient small bowel obstruction had resolved the patient's nausea and vomiting also resolved. Patient improved clinically and be discharged in stable and improved condition.  #8 COPD  Stable. Continued on Spiriva and duoneb.  #9 hypertension  On admission patient was placed on bowel rest secondary to small bowel obstruction. Patient was placed on IV Lopressor for blood pressure. Patient's blood pressure remained stable. Once patient was tolerating oral intake she was resumed back on her home regimen.  #10 hypokalemia  Likely secondary to diureses. Repleted.  #11 coronary artery disease  Stable. Cardiac enzymes negative x3. 2-D echo with EF of 55-60% with no wall motion abnormalities. Patient was initially placed on IV Lopressor as was placed on bowel rest secondary to problem #1/small bowel obstruction. Patient remained asymptomatic throughout the hospitalization. Patient's cardiac medications were resumed when she was started back on oral intake.  #12 depression  Patient remained stable during the hospitalization and was maintained on a home regimen of Cymbalta.  #13 acute encephalopathy  Likely secondary to acute illness with CHF exacerbation and small bowel obstruction in the setting of dementia. Urine cultures with yeast. Patient was initially placed empirically on IV antibiotics which were discontinued after urine cultures came back. Patient improved clinically and was back to baseline by day of discharge.      Procedures: Abdominal series 12/23/2013,12/25/13, 12/26/2013  CT chest 12/23/2013  2 d echo 12/24/13     Consultations:  General Surgery; Dr Ezzard Standing 12/24/13  Discharge Exam: Ceasar Mons Vitals:  12/28/13 0650  BP: 158/68  Pulse: 75  Temp: 98.5 F (36.9 C)  Resp: 18    General:  NAD Cardiovascular: RRR Respiratory: CTAB anterior lung [place].  Discharge Instructions You were cared for by a hospitalist during your hospital stay. If you have any questions about your discharge medications or the care you received while you were in the hospital after you are discharged, you can call the unit and asked to speak with the hospitalist on call if the hospitalist that took care of you is not available. Once you are discharged, your primary care physician will handle any further medical issues. Please note that NO REFILLS for any discharge medications will be authorized once you are discharged, as it is imperative that you return to your primary care physician (or establish a relationship with a primary care physician if you do not have one) for your aftercare needs so that they can reassess your need for medications and monitor your lab values.      Discharge Instructions   Diet - low sodium heart healthy    Complete by:  As directed      Discharge instructions    Complete by:  As directed   Follow up with MD at SNF.     Increase activity slowly    Complete by:  As directed             Medication List    STOP taking these medications       levofloxacin 500 MG tablet  Commonly known as:  LEVAQUIN     saccharomyces boulardii 250 MG capsule  Commonly known as:  FLORASTOR      TAKE these medications       acetaminophen 325 MG tablet  Commonly known as:  TYLENOL  Take 650 mg by mouth every 8 (eight) hours.     acyclovir 400 MG tablet  Commonly known as:  ZOVIRAX  Take 200 mg by mouth daily.     amLODipine 10 MG tablet  Commonly known as:  NORVASC  Take 10 mg by mouth daily.     amoxicillin 500 MG capsule  Commonly known as:  AMOXIL  Take 4 capsules by mouth prior to dental procedure as directed by the access dental care staff     ARTIFICIAL TEAR OP  Place 1-2 drops into both eyes 4 (four) times daily.     calcium-vitamin D 500-200 MG-UNIT per tablet   Commonly known as:  OSCAL WITH D  Take 1 tablet by mouth daily.     celecoxib 200 MG capsule  Commonly known as:  CELEBREX  Take 200 mg by mouth daily.     DULoxetine 30 MG capsule  Commonly known as:  CYMBALTA  Take 30 mg by mouth daily.     Fluticasone-Salmeterol 100-50 MCG/DOSE Aepb  Commonly known as:  ADVAIR  Inhale 1 puff into the lungs every 12 (twelve) hours.     furosemide 20 MG tablet  Commonly known as:  LASIX  Take 20 mg by mouth daily.     gabapentin 100 MG capsule  Commonly known as:  NEURONTIN  Take 100 mg by mouth 3 (three) times daily.     ipratropium-albuterol 0.5-2.5 (3) MG/3ML Soln  Commonly known as:  DUONEB  Take 3 mLs by nebulization every 6 (six) hours as needed (shortness of breath).     isosorbide mononitrate 30 MG 24 hr tablet  Commonly known as:  IMDUR  Take 30 mg by mouth daily.  levothyroxine 25 MCG tablet  Commonly known as:  SYNTHROID, LEVOTHROID  Take 25 mcg by mouth daily.     losartan 50 MG tablet  Commonly known as:  COZAAR  Take 50 mg by mouth 2 (two) times daily.     NAMENDA XR 7 MG Cp24  Generic drug:  Memantine HCl ER  Take 14 mg by mouth daily.     nebivolol 10 MG tablet  Commonly known as:  BYSTOLIC  Take 1 tablet (10 mg total) by mouth daily.     omeprazole 40 MG capsule  Commonly known as:  PRILOSEC  Take 40 mg by mouth 2 (two) times daily.     potassium chloride 10 MEQ CR capsule  Commonly known as:  MICRO-K  Take 10 mEq by mouth daily.     promethazine 12.5 MG suppository  Commonly known as:  PHENERGAN  Place 12.5 mg rectally every 4 (four) hours as needed for nausea or vomiting.     senna-docusate 8.6-50 MG per tablet  Commonly known as:  SENOKOT S  Take 1 tablet by mouth 2 (two) times daily.     tiotropium 18 MCG inhalation capsule  Commonly known as:  SPIRIVA  Place 18 mcg into inhaler and inhale daily.     traMADol 50 MG tablet  Commonly known as:  ULTRAM  Take 1 tablet (50 mg total) by mouth  every 8 (eight) hours. 1 by mouth three times daily (6 am, 2 pm, 10 pm)       No Known Allergies Follow-up Information   Schedule an appointment as soon as possible for a visit in 1 week to follow up. ([facility]        The results of significant diagnostics from this hospitalization (including imaging, microbiology, ancillary and laboratory) are listed below for reference.    Significant Diagnostic Studies: Dg Chest 1 View  12/04/2013   CLINICAL DATA:  FALL FALL  EXAM: CHEST - 1 VIEW  COMPARISON:  05/05/2012  FINDINGS: Increase in small bilateral pleural effusions. Mild cardiomegaly stable. Mild perihilar interstitial edema or infiltrates. Atheromatous aorta. Cervical fixation hardware partially seen.  IMPRESSION: 1. Mild cardiomegaly and perihilar interstitial edema/infiltrates. 2. Interval increase pleural effusions.   Electronically Signed   By: Oley Balmaniel  Hassell M.D.   On: 12/04/2013 07:30   Dg Chest 2 View  12/22/2013   CLINICAL DATA:  Short of breath, fever, recent pneumonia  EXAM: CHEST  2 VIEW  COMPARISON:  Chest radiograph 12/04/2013  FINDINGS: There cardiac silhouette is enlarged. There is left basilar atelectasis and a moderate left effusion which is increased compared to prior. There is smaller right effusion. No clear infiltrate is identified.  IMPRESSION: Increasing left basilar atelectasis and effusion.   Electronically Signed   By: Genevive BiStewart  Edmunds M.D.   On: 12/22/2013 18:19   Dg Wrist Complete Right  12/04/2013   CLINICAL DATA:  FALL  EXAM: RIGHT WRIST - COMPLETE 3+ VIEW  COMPARISON:  06/27/2010  FINDINGS: Diffuse osteopenia. Advanced degenerative change at the [facility] articulation. Carpal rows intact. Interval healing across the previously noted distal radial fracture, with neutral angulation of the distal radial articular surface as before. Chondrocalcinosis in the TFCC.  IMPRESSION: 1. Osteopenia and degenerative change without fracture or other acute abnormality.    Electronically Signed   By: Oley Balmaniel  Hassell M.D.   On: 12/04/2013 07:32   Dg Hip Complete Left  12/04/2013   CLINICAL DATA:  FALL FALL  EXAM: LEFT HIP -  COMPLETE 2+ VIEW  COMPARISON:  10/12/2010  FINDINGS: Sliding screw and compression plate in the proximal femur. There has been fracture/lysis of the femoral head and neck with a residual fragment of the femoral head noted in the acetabulum. There has been interval migration and erosion into supra-acetabular region by threads and tip of the sliding screw.  Components of right hip arthroplasty articulating normally, femoral stem not seen. Degenerative changes in the visualized lower lumbar spine.  IMPRESSION: 1. Fragmentation/lysis of the left femoral head and neck with migration and erosion of previously placed fixation hardware into the supra-acetabular region.   Electronically Signed   By: Oley Balm M.D.   On: 12/04/2013 07:25   Dg Femur Left  12/04/2013   CLINICAL DATA:  FALL FALL  EXAM: LEFT FEMUR - 2 VIEW  COMPARISON:  12/12/2010  FINDINGS: No acute fracture. Diffuse osteopenia. Sliding screw and compression plate in the proximal femur as before. There has been interval fragmentation/ lysis of the femoral head and neck, with superior migration of the fixation hardware with respect to the acetabulum, and erosion of the sliding screw tip into the supra-acetabular region.  IMPRESSION: 1. Interval lysis/fragmentation of femoral head and resultant migration of fixation hardware into the supra-acetabular region.   Electronically Signed   By: Oley Balm M.D.   On: 12/04/2013 07:28   Dg Abd 1 View  12/25/2013   CLINICAL DATA:  Abdominal pain. Evaluate partial small bowel obstruction.  EXAM: ABDOMEN - 1 VIEW  COMPARISON:  12/24/2013.  FINDINGS: AP supine radiograph demonstrates normal caliber the visualized small large bowel. Distal colonic gas can be seen in the rectum. Little change compared with yesterday's examination. No decubitus exam to  evaluate for air-fluid levels or free air. Stable BILATERAL hip instrumentation.  IMPRESSION: Grossly unchanged bowel gas pattern.  No gross bowel obstruction.   Electronically Signed   By: Davonna Belling M.D.   On: 12/25/2013 08:45   Ct Head Wo Contrast  12/22/2013   CLINICAL DATA:  Progressive weakness and fatigue. Dementia. Anemia. Parkinson's disease.  EXAM: CT HEAD WITHOUT CONTRAST  TECHNIQUE: Contiguous axial images were obtained from the base of the skull through the vertex without intravenous contrast.  COMPARISON:  12/04/2013  FINDINGS: Sinuses/Soft tissues: Surgical changes of both globes. Fluid in the sphenoid sinus is decreased. Clear mastoid air cells.  Intracranial: Expected cerebral atrophy. Moderate to marked low density in the periventricular white matter likely related to small vessel disease. Bilateral vertebral atherosclerosis. Scattered lacunar infarcts within the basal ganglia and thalami, likely chronic.  No mass lesion, hemorrhage, hydrocephalus, acute infarct, intra-axial, or extra-axial fluid collection.  IMPRESSION: 1.  No acute intracranial abnormality. 2.  Cerebral atrophy and small vessel ischemic change. 3. Sinus disease.   Electronically Signed   By: Jeronimo Greaves M.D.   On: 12/22/2013 18:27   Ct Head Wo Contrast  12/04/2013   CLINICAL DATA:  FALL FALL  EXAM: CT HEAD WITHOUT CONTRAST  CT CERVICAL SPINE WITHOUT CONTRAST  TECHNIQUE: Multidetector CT imaging of the head and cervical spine was performed following the standard protocol without intravenous contrast. Multiplanar CT image reconstructions of the cervical spine were also generated.  COMPARISON:  10/12/2010  FINDINGS: CT HEAD FINDINGS  Atherosclerotic and physiologic intracranial calcifications. Fluid level in the sphenoid sinus. Stable asymmetric mineralization of basal ganglia right greater than left. Diffuse parenchymal atrophy. Patchy areas of hypoattenuation in deep and periventricular white matter bilaterally. Negative  for acute intracranial hemorrhage, mass lesion, acute infarction, midline  shift, or mass-effect. Acute infarct may be inapparent on noncontrast CT. Ventricles and sulci symmetric. Bone windows demonstrate no focal lesion.  CT CERVICAL SPINE FINDINGS  Solid-appearing instrumented ACDF C3-4. Facet DJD C4-5 allows 2-3 mm anterolisthesis, contributes to bilateral foraminal encroachment. There is facet DJD bilaterally C5-6 and C6-7. Negative for fracture. No prevertebral soft tissue swelling. Some streak artifact from dental restorations degrades portions of the study.  IMPRESSION: 1. Negative for bleed or other acute intracranial process. 2. Negative for cervical fracture or dislocation. 3. Stable parenchymal atrophy and nonspecific white matter changes. 4. Mild anterolisthesis C4-5 may be secondary to facet disease. Flexion/extension radiographs would be required to exclude dynamic instability.   Electronically Signed   By: Oley Balm M.D.   On: 12/04/2013 08:08   Ct Chest Wo Contrast  12/23/2013   CLINICAL DATA:  80 year old with shortness of breath and pleural effusions. History of chronic diastolic congestive heart failure, COPD and hypothyroidism.  EXAM: CT CHEST WITHOUT CONTRAST  TECHNIQUE: Multidetector CT imaging of the chest was performed following the standard protocol without IV contrast.  COMPARISON:  Radiographs 12/22/2013.  CT 03/13/2006.  FINDINGS: Mediastinum: There are no enlarged mediastinal, hilar or axillary lymph nodes. The thyroid gland appears normal. Tracheobronchial calcifications are noted. There is a small hiatal hernia. The heart is mildly enlarged.There is mild atherosclerosis of the aorta, great vessels and coronary arteries.  Lungs/Pleura: There are small dependent bilateral pleural effusions. There is no pericardial effusion. Dependent opacities at both lung bases are consistent with atelectasis. There are patchy ground-glass opacities with an upper lobe predominance, possibly  reflecting edema. There is no consolidation or suspicious pulmonary nodule.  Upper abdomen: There is an enlarging cyst in the upper pole of the right kidney, measuring 4.4 cm on image 49. The gallbladder is surgically absent. There is no adrenal mass.  Musculoskeletal/Chest wall: No chest wall masses or suspicious osseous findings demonstrated.  IMPRESSION: 1. Bilateral pleural effusions with associated bibasilar atelectasis. 2. Patchy ground-glass opacities in both lungs suggesting mild edema. No consolidation or suspicious pulmonary nodule. 3. Mild cardiomegaly and atherosclerosis.   Electronically Signed   By: Roxy Horseman M.D.   On: 12/23/2013 09:49   Ct Cervical Spine Wo Contrast  12/04/2013   CLINICAL DATA:  FALL FALL  EXAM: CT HEAD WITHOUT CONTRAST  CT CERVICAL SPINE WITHOUT CONTRAST  TECHNIQUE: Multidetector CT imaging of the head and cervical spine was performed following the standard protocol without intravenous contrast. Multiplanar CT image reconstructions of the cervical spine were also generated.  COMPARISON:  10/12/2010  FINDINGS: CT HEAD FINDINGS  Atherosclerotic and physiologic intracranial calcifications. Fluid level in the sphenoid sinus. Stable asymmetric mineralization of basal ganglia right greater than left. Diffuse parenchymal atrophy. Patchy areas of hypoattenuation in deep and periventricular white matter bilaterally. Negative for acute intracranial hemorrhage, mass lesion, acute infarction, midline shift, or mass-effect. Acute infarct may be inapparent on noncontrast CT. Ventricles and sulci symmetric. Bone windows demonstrate no focal lesion.  CT CERVICAL SPINE FINDINGS  Solid-appearing instrumented ACDF C3-4. Facet DJD C4-5 allows 2-3 mm anterolisthesis, contributes to bilateral foraminal encroachment. There is facet DJD bilaterally C5-6 and C6-7. Negative for fracture. No prevertebral soft tissue swelling. Some streak artifact from dental restorations degrades portions of the study.   IMPRESSION: 1. Negative for bleed or other acute intracranial process. 2. Negative for cervical fracture or dislocation. 3. Stable parenchymal atrophy and nonspecific white matter changes. 4. Mild anterolisthesis C4-5 may be secondary to facet disease. Flexion/extension radiographs would  be required to exclude dynamic instability.   Electronically Signed   By: Oley Balm M.D.   On: 12/04/2013 08:08   Dg Abd 2 Views  12/26/2013   CLINICAL DATA:  Abdominal distension  EXAM: ABDOMEN - 2 VIEW  COMPARISON:  CT 12/25/2013  FINDINGS: Gas-filled loops of large and small bowel which are not pathologically dilated. There is gas in the rectum. No intraperitoneal free air on the lateral decubitus projection.  IMPRESSION: No evidence of bowel obstruction.  No free air.   Electronically Signed   By: Genevive Bi M.D.   On: 12/26/2013 09:05   Dg Abd 2 Views  12/24/2013   CLINICAL DATA:  Bowel obstruction  EXAM: ABDOMEN - 2 VIEW  COMPARISON:  December 23, 2013  FINDINGS: Supine and left lateral decubitus images were obtained. Several loops of borderline dilated small bowel remain with scattered air-fluid levels. No free air is appreciable.  There is a total hip prosthesis in the right. There is postoperative change in the left proximal femur with resorption of the left femoral head.  IMPRESSION: The bowel gas pattern is consistent with ileus or a degree of partial obstruction. There is very little change compared to 1 day prior. No free air seen.   Electronically Signed   By: Bretta Bang M.D.   On: 12/24/2013 08:46   Dg Abd 2 Views  12/23/2013   CLINICAL DATA:  Abdominal pain  EXAM: ABDOMEN - 2 VIEW  COMPARISON:  None.  FINDINGS: Scattered large and small bowel gas is noted. Differential air-fluid levels are seen with mild small bowel dilatation. No definitive free air is seen. Degenerative changes of the lumbar spine are noted. Postsurgical changes are seen in both hips. Chronic changes of proximal left femur  are noted.  IMPRESSION: Mild small bowel dilatation likely indicating a partial small bowel obstruction. Correlation with physical exam is recommended.   Electronically Signed   By: Alcide Clever M.D.   On: 12/23/2013 09:45    Microbiology: Recent Results (from the past 240 hour(s))  URINE CULTURE     Status: None   Collection Time    12/22/13  7:08 PM      Result Value Ref Range Status   Specimen Description URINE, CATHETERIZED   Final   Special Requests NONE   Final   Culture  Setup Time     Final   Value: 12/23/2013 15:31     Performed at [facility] Count     Final   Value: >=100,000 COLONIES/ML     Performed at [facility]   Culture     Final   Value: YEAST     Performed at [facility]   Report Status 12/25/2013 FINAL   Final     Labs: Basic Metabolic Panel:  Recent Labs Lab 12/24/13 0500 12/24/13 0552 12/25/13 0600 12/26/13 0600 12/27/13 0503 12/28/13 0550  NA 140  --  143 143 141 142  K 3.1*  --  4.1 3.2* 3.2* 4.3  CL 99  --  99 96 103 106  CO2 24  --  27 27 25 23   GLUCOSE 66*  --  87 87 94 97  BUN 26*  --  19 23 23 23   CREATININE 1.20*  --  0.99 1.14* 1.00 0.95  CALCIUM 8.7  --  9.1 9.3 8.9 8.8  MG  --  2.0  --   --   --   --  Liver Function Tests:  Recent Labs Lab 12/22/13 2022  AST 16  ALT 12  ALKPHOS 272*  BILITOT 0.5  PROT 7.0  ALBUMIN 2.6*    Recent Labs Lab 12/22/13 1858  LIPASE 43   No results found for this basename: AMMONIA,  in the last 168 hours CBC:  Recent Labs Lab 12/22/13 1745  12/24/13 0500 12/25/13 0600 12/26/13 0600 12/27/13 0503 12/28/13 0550  WBC 17.5*  < > 12.4* 11.8* 14.1* 13.9* 14.8*  NEUTROABS 14.6*  --   --   --   --   --   --   HGB 13.0  < > 11.9* 13.7 13.1 12.5 11.9*  HCT 38.9  < > 36.9 40.8 39.8 37.5 36.0  MCV 94.4  < > 95.6 95.8 95.0 94.2 95.5  PLT 542*  < > 503* 541* 496* 470* 469*  < > = values in this interval not displayed. Cardiac Enzymes:  Recent Labs Lab  12/22/13 2337 12/23/13 0525 12/23/13 1120  TROPONINI <0.30 <0.30 <0.30   BNP: BNP (last 3 results)  Recent Labs  12/22/13 1858 12/24/13 0500 12/25/13 0600  PROBNP 4519.0* 2064.0* 3606.0*   CBG: No results found for this basename: GLUCAP,  in the last 168 hours     Signed:  [facility] MD [facility] 12/28/2013, 1:47 PM

## 2013-12-30 ENCOUNTER — Non-Acute Institutional Stay (SKILLED_NURSING_FACILITY): Payer: Medicare Other | Admitting: Internal Medicine

## 2013-12-30 ENCOUNTER — Encounter: Payer: Self-pay | Admitting: Internal Medicine

## 2013-12-30 DIAGNOSIS — F419 Anxiety disorder, unspecified: Secondary | ICD-10-CM

## 2013-12-30 DIAGNOSIS — K560 Paralytic ileus: Secondary | ICD-10-CM

## 2013-12-30 DIAGNOSIS — F0390 Unspecified dementia without behavioral disturbance: Secondary | ICD-10-CM

## 2013-12-30 DIAGNOSIS — I5033 Acute on chronic diastolic (congestive) heart failure: Secondary | ICD-10-CM

## 2013-12-30 DIAGNOSIS — M25552 Pain in left hip: Secondary | ICD-10-CM

## 2013-12-30 DIAGNOSIS — F411 Generalized anxiety disorder: Secondary | ICD-10-CM

## 2013-12-30 DIAGNOSIS — G9340 Encephalopathy, unspecified: Secondary | ICD-10-CM

## 2013-12-30 DIAGNOSIS — K56609 Unspecified intestinal obstruction, unspecified as to partial versus complete obstruction: Secondary | ICD-10-CM

## 2013-12-30 DIAGNOSIS — G8929 Other chronic pain: Secondary | ICD-10-CM

## 2013-12-30 DIAGNOSIS — K567 Ileus, unspecified: Secondary | ICD-10-CM

## 2013-12-30 DIAGNOSIS — I10 Essential (primary) hypertension: Secondary | ICD-10-CM

## 2013-12-30 DIAGNOSIS — M25559 Pain in unspecified hip: Secondary | ICD-10-CM

## 2013-12-30 DIAGNOSIS — J181 Lobar pneumonia, unspecified organism: Secondary | ICD-10-CM

## 2013-12-30 DIAGNOSIS — J189 Pneumonia, unspecified organism: Secondary | ICD-10-CM

## 2013-12-30 DIAGNOSIS — I25810 Atherosclerosis of coronary artery bypass graft(s) without angina pectoris: Secondary | ICD-10-CM

## 2013-12-30 DIAGNOSIS — B3749 Other urogenital candidiasis: Secondary | ICD-10-CM

## 2013-12-30 NOTE — Progress Notes (Signed)
Patient ID: [name] [name], female   DOB: [birth year], 80 y.o.   MRN: [id number]    Location:  [place] of Service: SNF [phone])  Extended Emergency Contact Information Primary Emergency Contact: [name] Address: [place], [place] [address] [name] of [place] Phone: [phone] Mobile Phone: [phone] Relation: Son Secondary Emergency Contact: [name] Address: [address]          [place],  [address] [name] of [place] Phone: [phone] Mobile Phone: [phone] Relation: Son   Chief Complaint  Patient presents with  . Hospitalization Follow-up    Readmit to SNF following hospitalization    HPI:  Hospitalized 8/12/15to 12/28/13. Presented to Er with vomiting and abd distention. Had SBO vs, non-specific ileus. Resolved over the next few days. Found to have acute on chronic diastolic heart failure with high pro-BNP. Was diuresed.   Candida found in urine. Possible infection. Had been on antibiotics at the SNF for pneumonia prior to this hospitalization.  She had acute encephalopathy while hospitalized. Remains confused, but seems to be clearing. Has known chronic dementia.  Left hip remains painful from degenerative bone problems.  Patient is here for continued efforts to gain strength, monitoring of her bowel problem, and ultimately long term care.  Past Medical History  Diagnosis Date  . Hypertension   . COPD (chronic obstructive pulmonary disease)   . Chronic diastolic CHF (congestive heart failure)   . Coronary artery disease   . Renal disorder   . GERD (gastroesophageal reflux disease)   . Osteoporosis   . Syncope   . Dementia   . Anemia   . Parkinson disease   . Neuropathy   . Thyroid disease   . Hyperlipidemia   . Anxiety   . Depression   . 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 UTI (lower urinary tract infection) 02/07/2013    . Unspecified hypothyroidism 08/25/2012    10/21/13 TSH 2.772   . Unspecified constipation 10/19/2013    10/18/13 Senokot S bid.    Marland Kitchen Unspecified chronic bronchitis 10/16/2012  . Herpes genitalis in women 10/16/2012  . SBO (small bowel obstruction) 12/22/13    Past Surgical History  Procedure Laterality Date  . Fracture surgery    . Esophagogastroduodenoscopy  05/05/2012    Procedure: ESOPHAGOGASTRODUODENOSCOPY (EGD);  Surgeon: Cleotis Nipper, MD;  Location: [facility] ENDOSCOPY;  Service: Endoscopy;  Laterality: N/A;  Pediatric upper endoscope    History   Social History  . Marital Status: Widowed    Spouse Name: N/A    Number of Children: N/A  . Years of Education: N/A   Occupational History  . Not on file.   Social History Main Topics  . Smoking status: Never Smoker   . Smokeless tobacco: Never Used  . Alcohol Use: No  . Drug Use: No  . Sexual Activity: No   Other Topics Concern  . Not on file   Social History Narrative  . No narrative on file     reports that she has never smoked. She has never used smokeless tobacco. She reports that she does not drink alcohol or use illicit drugs.  Immunization History  Administered Date(s) Administered  . Influenza Whole 03/03/2013  . Pneumococcal-Unspecified 05/13/2000    No Known Allergies  Medications: Patient's Medications  New Prescriptions   No medications on file  Previous  Medications   ACETAMINOPHEN (TYLENOL) 325 MG TABLET    Take 650 mg by mouth every 8 (eight) hours.    ACYCLOVIR (ZOVIRAX) 400 MG TABLET    Take 200 mg by mouth daily.   AMLODIPINE (NORVASC) 10 MG TABLET    Take 10 mg by mouth daily.   AMOXICILLIN (AMOXIL) 500 MG CAPSULE    Take 4 capsules by mouth prior to dental procedure as directed by the access dental care staff   ARTIFICIAL TEAR OP    Place 1-2 drops into both eyes 4 (four) times daily.   CALCIUM-VITAMIN D (OSCAL WITH D) 500-200 MG-UNIT PER TABLET    Take 1 tablet by mouth daily.   CELECOXIB  (CELEBREX) 200 MG CAPSULE    Take 200 mg by mouth daily.   DULOXETINE (CYMBALTA) 30 MG CAPSULE    Take 30 mg by mouth daily.    FLUTICASONE-SALMETEROL (ADVAIR) 100-50 MCG/DOSE AEPB    Inhale 1 puff into the lungs every 12 (twelve) hours.   FUROSEMIDE (LASIX) 20 MG TABLET    Take 20 mg by mouth daily.   GABAPENTIN (NEURONTIN) 100 MG CAPSULE    Take 100 mg by mouth 3 (three) times daily.   IPRATROPIUM-ALBUTEROL (DUONEB) 0.5-2.5 (3) MG/3ML SOLN    Take 3 mLs by nebulization every 6 (six) hours as needed (shortness of breath).   ISOSORBIDE MONONITRATE (IMDUR) 30 MG 24 HR TABLET    Take 30 mg by mouth daily.   LEVOTHYROXINE (SYNTHROID, LEVOTHROID) 25 MCG TABLET    Take 25 mcg by mouth daily.    LOSARTAN (COZAAR) 50 MG TABLET    Take 50 mg by mouth 2 (two) times daily.   MEMANTINE HCL ER (NAMENDA XR) 7 MG CP24    Take 14 mg by mouth daily.   NEBIVOLOL (BYSTOLIC) 10 MG TABLET    Take 1 tablet (10 mg total) by mouth daily.   OMEPRAZOLE (PRILOSEC) 40 MG CAPSULE    Take 40 mg by mouth 2 (two) times daily.   POTASSIUM CHLORIDE (MICRO-K) 10 MEQ CR CAPSULE    Take 10 mEq by mouth daily.    PROMETHAZINE (PHENERGAN) 12.5 MG SUPPOSITORY    Place 12.5 mg rectally every 4 (four) hours as needed for nausea or vomiting.   SENNA-DOCUSATE (SENOKOT S) 8.6-50 MG PER TABLET    Take 1 tablet by mouth 2 (two) times daily.   TIOTROPIUM (SPIRIVA) 18 MCG INHALATION CAPSULE    Place 18 mcg into inhaler and inhale daily.   TRAMADOL (ULTRAM) 50 MG TABLET    Take 1 tablet (50 mg total) by mouth every 8 (eight) hours. 1 by mouth three times daily (6 am, 2 pm, 10 pm)  Modified Medications   No medications on file  Discontinued Medications   No medications on file     Review of Systems  Constitutional: Positive for activity change and fatigue. Negative for fever, chills and appetite change.  HENT: Positive for hearing loss. Negative for sore throat and trouble swallowing.   Eyes: Positive for visual disturbance (Corrective  lenses).  Respiratory: Positive for cough and wheezing. Negative for choking and shortness of breath.        Chronic cough, has been on HHI to manage wheezes and cough  Cardiovascular: Negative for chest pain, palpitations and leg swelling.  Gastrointestinal: Positive for constipation. Negative for nausea and abdominal pain.  Endocrine: Negative for cold intolerance, heat intolerance, polydipsia and polyphagia.  Genitourinary: Positive for frequency. Negative for urgency, vaginal discharge, difficulty  urinating and vaginal pain.  Musculoskeletal: Positive for arthralgias, gait problem, neck pain and neck stiffness.       Left hip s/p ORIF and chronic pain, had Ortho consultation and no further surgical procedure recommended  Skin:       Laceration right brow area and multiple abrasions of the arms and hands.  Neurological: Positive for numbness. Negative for tremors and weakness.       Moderate dementia.  Hematological: Negative.   Psychiatric/Behavioral: Positive for hallucinations.       Remote hx of hallucination treated with Seroquel    Filed Vitals:   12/30/13 1314  BP: 130/70  Pulse: 74  Temp: 98.6 F (37 C)  Resp: 20  Height: $Remove'5\' 2"'[id number]$  (1.575 m)  Weight: 150 lb (68.04 kg)   Body mass index is 27.43 kg/(m^2).  Physical Exam  Constitutional: She is oriented to person, place, and time. She appears well-developed and well-nourished.  HENT:  Head: Normocephalic and atraumatic.  Eyes: Conjunctivae and EOM are normal. Pupils are equal, round, and reactive to light.  Neck: Normal range of motion. No JVD present. No thyromegaly present.  Chronic decreased lateral ROM of the neck   Cardiovascular: Regular rhythm and normal heart sounds.   No murmur heard. HR 50-60s  Pulmonary/Chest: Effort normal. She has no wheezes. She has no rales.  Abdominal: Soft. She exhibits distension. She exhibits no mass. There is no tenderness. There is no rebound.  Borborygmi present  Musculoskeletal:  She exhibits edema and tenderness.  Mainly pain in neck and left hip. 1+ edema BLE. Lipo dermatosclerosis BLE from knee down. R sided rib cage tenderness when palpated, but not with deep breath.   Lymphadenopathy:    She has no cervical adenopathy.  Neurological: She is alert and oriented to person, place, and time. She has normal reflexes. No cranial nerve deficit. She exhibits normal muscle tone. Coordination normal.  Skin: Skin is warm and dry. No lesion and no rash noted. No erythema.  New skin lesion at bridge of her nose, new, scaly, superficial, non healing--11/17/12 Dermatology consultation: Bowen's disease-R leg. 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 BLE chronic venous insufficiency with mild erythema lower 1/3 of the BLE.  BLE chronic venous insufficiency with mild erythema lower 1/3 of the BLE. Lipo dermatosclerosis. Left wrist bruise. Multiple skin tears R arm/hand.  Psychiatric: Cognition and memory are impaired. She exhibits abnormal recent memory.     Labs reviewed: Admission on 12/22/2013, Discharged on 12/28/2013  No results displayed because visit has over 200 results.    Admission on 12/04/2013, Discharged on 12/05/2013  Component Date Value Ref Range Status  . WBC 12/04/2013 14.6* 4.0 - 10.5 K/uL Final  . RBC 12/04/2013 4.03  3.87 - 5.11 MIL/uL Final  . Hemoglobin 12/04/2013 12.9  12.0 - 15.0 g/dL Final  . HCT 12/04/2013 38.8  36.0 - 46.0 % Final  . MCV 12/04/2013 96.3  78.0 - 100.0 fL Final  . MCH 12/04/2013 32.0  26.0 - 34.0 pg Final  . MCHC 12/04/2013 33.2  30.0 - 36.0 g/dL Final  . RDW 12/04/2013 14.1  11.5 - 15.5 % Final  . Platelets 12/04/2013 331  150 - 400 K/uL Final  . Neutrophils Relative % 12/04/2013 79* 43 - 77 % Final  . Neutro Abs 12/04/2013 11.5* 1.7 - 7.7 K/uL Final  . Lymphocytes Relative 12/04/2013 11* 12 - 46 % Final  . Lymphs Abs 12/04/2013 1.6  0.7 - 4.0 K/uL  Final  . Monocytes Relative 12/04/2013 8  3  - 12 % Final  . Monocytes Absolute 12/04/2013 1.2* 0.1 - 1.0 K/uL Final  . Eosinophils Relative 12/04/2013 2  0 - 5 % Final  . Eosinophils Absolute 12/04/2013 0.2  0.0 - 0.7 K/uL Final  . Basophils Relative 12/04/2013 0  0 - 1 % Final  . Basophils Absolute 12/04/2013 0.1  0.0 - 0.1 K/uL Final  . Prothrombin Time 12/04/2013 13.1  11.6 - 15.2 seconds Final  . INR 12/04/2013 0.99  0.00 - 1.49 Final  . ABO/RH(D) 12/04/2013 A POS   Final  . Antibody Screen 12/04/2013 NEG   Final  . Sample Expiration 12/04/2013 12/07/2013   Final  . Sodium 12/04/2013 142  137 - 147 mEq/L Final  . Potassium 12/04/2013 3.9  3.7 - 5.3 mEq/L Final  . Chloride 12/04/2013 104  96 - 112 mEq/L Final  . CO2 12/04/2013 25  19 - 32 mEq/L Final  . Glucose, Bld 12/04/2013 94  70 - 99 mg/dL Final  . BUN 12/04/2013 16  6 - 23 mg/dL Final  . Creatinine, Ser 12/04/2013 0.96  0.50 - 1.10 mg/dL Final  . Calcium 12/04/2013 8.9  8.4 - 10.5 mg/dL Final  . Total Protein 12/04/2013 6.6  6.0 - 8.3 g/dL Final  . Albumin 12/04/2013 2.8* 3.5 - 5.2 g/dL Final  . AST 12/04/2013 20  0 - 37 U/L Final  . ALT 12/04/2013 13  0 - 35 U/L Final  . Alkaline Phosphatase 12/04/2013 96  39 - 117 U/L Final  . Total Bilirubin 12/04/2013 0.3  0.3 - 1.2 mg/dL Final  . GFR calc non Af Amer 12/04/2013 49* >90 mL/min Final  . GFR calc Af Amer 12/04/2013 57* >90 mL/min Final   Comment: (NOTE)                          The eGFR has been calculated using the CKD EPI equation.                          This calculation has not been validated in all clinical situations.                          eGFR's persistently <90 mL/min signify possible Chronic Kidney                          Disease.  . Anion gap 12/04/2013 13  5 - 15 Final  . ABO/RH(D) 12/04/2013 A POS   Final  . WBC 12/05/2013 10.2  4.0 - 10.5 K/uL Final  . RBC 12/05/2013 3.68* 3.87 - 5.11 MIL/uL Final  . Hemoglobin 12/05/2013 11.6* 12.0 - 15.0 g/dL Final  . HCT 12/05/2013 36.4  36.0 - 46.0 % Final    . MCV 12/05/2013 98.9  78.0 - 100.0 fL Final  . MCH 12/05/2013 31.5  26.0 - 34.0 pg Final  . MCHC 12/05/2013 31.9  30.0 - 36.0 g/dL Final  . RDW 12/05/2013 14.5  11.5 - 15.5 % Final  . Platelets 12/05/2013 308  150 - 400 K/uL Final  . Sodium 12/05/2013 143  137 - 147 mEq/L Final  . Potassium 12/05/2013 4.2  3.7 - 5.3 mEq/L Final  . Chloride 12/05/2013 106  96 - 112 mEq/L Final  . CO2 12/05/2013 24  19 - 32 mEq/L Final  .  Glucose, Bld 12/05/2013 77  70 - 99 mg/dL Final  . BUN 12/05/2013 17  6 - 23 mg/dL Final  . Creatinine, Ser 12/05/2013 0.92  0.50 - 1.10 mg/dL Final  . Calcium 12/05/2013 8.6  8.4 - 10.5 mg/dL Final  . GFR calc non Af Amer 12/05/2013 52* >90 mL/min Final  . GFR calc Af Amer 12/05/2013 60* >90 mL/min Final   Comment: (NOTE)                          The eGFR has been calculated using the CKD EPI equation.                          This calculation has not been validated in all clinical situations.                          eGFR's persistently <90 mL/min signify possible Chronic Kidney                          Disease.  . Anion gap 12/05/2013 13  5 - 15 Final  Lab on 11/30/2013  Component Date Value Ref Range Status  . Glucose 11/29/2013 86   Final  . BUN 11/29/2013 14  4 - 21 mg/dL Final  . Creatinine 11/29/2013 0.9  0.5 - 1.1 mg/dL Final  . Potassium 11/29/2013 4.3  3.4 - 5.3 mmol/L Final  . Sodium 11/29/2013 140  137 - 147 mmol/L Final  Lab on 10/22/2013  Component Date Value Ref Range Status  . TSH 10/21/2013 2.77  0.41 - 5.90 uIU/mL Final  Nursing Home on 10/19/2013  Component Date Value Ref Range Status  . Glucose 10/18/2013 82   Final  . BUN 10/18/2013 25* 4 - 21 mg/dL Final  . Creatinine 10/18/2013 1.0  0.5 - 1.1 mg/dL Final  . Potassium 10/18/2013 3.9  3.4 - 5.3 mmol/L Final  . Sodium 10/18/2013 138  137 - 147 mmol/L Final    Dg Chest 1 View  12/04/2013   CLINICAL DATA:  FALL FALL  EXAM: CHEST - 1 VIEW  COMPARISON:  05/05/2012  FINDINGS: Increase in  small bilateral pleural effusions. Mild cardiomegaly stable. Mild perihilar interstitial edema or infiltrates. Atheromatous aorta. Cervical fixation hardware partially seen.  IMPRESSION: 1. Mild cardiomegaly and perihilar interstitial edema/infiltrates. 2. Interval increase pleural effusions.   Electronically Signed   By: Arne Cleveland M.D.   On: 12/04/2013 07:30   Dg Chest 2 View  12/22/2013   CLINICAL DATA:  Short of breath, fever, recent pneumonia  EXAM: CHEST  2 VIEW  COMPARISON:  Chest radiograph 12/04/2013  FINDINGS: There cardiac silhouette is enlarged. There is left basilar atelectasis and a moderate left effusion which is increased compared to prior. There is smaller right effusion. No clear infiltrate is identified.  IMPRESSION: Increasing left basilar atelectasis and effusion.   Electronically Signed   By: Suzy Bouchard M.D.   On: 12/22/2013 18:19   Dg Wrist Complete Right  12/04/2013   CLINICAL DATA:  FALL  EXAM: RIGHT WRIST - COMPLETE 3+ VIEW  COMPARISON:  06/27/2010  FINDINGS: Diffuse osteopenia. Advanced degenerative change at the first [facility] articulation. Carpal rows intact. Interval healing across the previously noted distal radial fracture, with neutral angulation of the distal radial articular surface as before. Chondrocalcinosis in the TFCC.  IMPRESSION: 1. Osteopenia and degenerative change  without fracture or other acute abnormality.   Electronically Signed   By: Arne Cleveland M.D.   On: 12/04/2013 07:32   Dg Hip Complete Left  12/04/2013   CLINICAL DATA:  FALL FALL  EXAM: LEFT HIP - COMPLETE 2+ VIEW  COMPARISON:  10/12/2010  FINDINGS: Sliding screw and compression plate in the proximal femur. There has been fracture/lysis of the femoral head and neck with a residual fragment of the femoral head noted in the acetabulum. There has been interval migration and erosion into supra-acetabular region by threads and tip of the sliding screw.  Components of right hip arthroplasty  articulating normally, femoral stem not seen. Degenerative changes in the visualized lower lumbar spine.  IMPRESSION: 1. Fragmentation/lysis of the left femoral head and neck with migration and erosion of previously placed fixation hardware into the supra-acetabular region.   Electronically Signed   By: Arne Cleveland M.D.   On: 12/04/2013 07:25   Dg Femur Left  12/04/2013   CLINICAL DATA:  FALL FALL  EXAM: LEFT FEMUR - 2 VIEW  COMPARISON:  12/12/2010  FINDINGS: No acute fracture. Diffuse osteopenia. Sliding screw and compression plate in the proximal femur as before. There has been interval fragmentation/ lysis of the femoral head and neck, with superior migration of the fixation hardware with respect to the acetabulum, and erosion of the sliding screw tip into the supra-acetabular region.  IMPRESSION: 1. Interval lysis/fragmentation of femoral head and resultant migration of fixation hardware into the supra-acetabular region.   Electronically Signed   By: Arne Cleveland M.D.   On: 12/04/2013 07:28   Dg Abd 1 View  12/25/2013   CLINICAL DATA:  Abdominal pain. Evaluate partial small bowel obstruction.  EXAM: ABDOMEN - 1 VIEW  COMPARISON:  12/24/2013.  FINDINGS: AP supine radiograph demonstrates normal caliber the visualized small large bowel. Distal colonic gas can be seen in the rectum. Little change compared with yesterday's examination. No decubitus exam to evaluate for air-fluid levels or free air. Stable BILATERAL hip instrumentation.  IMPRESSION: Grossly unchanged bowel gas pattern.  No gross bowel obstruction.   Electronically Signed   By: Rolla Flatten M.D.   On: 12/25/2013 08:45   Ct Head Wo Contrast  12/22/2013   CLINICAL DATA:  Progressive weakness and fatigue. Dementia. Anemia. Parkinson's disease.  EXAM: CT HEAD WITHOUT CONTRAST  TECHNIQUE: Contiguous axial images were obtained from the base of the skull through the vertex without intravenous contrast.  COMPARISON:  12/04/2013  FINDINGS:  Sinuses/Soft tissues: Surgical changes of both globes. Fluid in the sphenoid sinus is decreased. Clear mastoid air cells.  Intracranial: Expected cerebral atrophy. Moderate to marked low density in the periventricular white matter likely related to small vessel disease. Bilateral vertebral atherosclerosis. Scattered lacunar infarcts within the basal ganglia and thalami, likely chronic.  No mass lesion, hemorrhage, hydrocephalus, acute infarct, intra-axial, or extra-axial fluid collection.  IMPRESSION: 1.  No acute intracranial abnormality. 2.  Cerebral atrophy and small vessel ischemic change. 3. Sinus disease.   Electronically Signed   By: Abigail Miyamoto M.D.   On: 12/22/2013 18:27   Ct Head Wo Contrast  12/04/2013   CLINICAL DATA:  FALL FALL  EXAM: CT HEAD WITHOUT CONTRAST  CT CERVICAL SPINE WITHOUT CONTRAST  TECHNIQUE: Multidetector CT imaging of the head and cervical spine was performed following the standard protocol without intravenous contrast. Multiplanar CT image reconstructions of the cervical spine were also generated.  COMPARISON:  10/12/2010  FINDINGS: CT HEAD FINDINGS  Atherosclerotic and physiologic  intracranial calcifications. Fluid level in the sphenoid sinus. Stable asymmetric mineralization of basal ganglia right greater than left. Diffuse parenchymal atrophy. Patchy areas of hypoattenuation in deep and periventricular white matter bilaterally. Negative for acute intracranial hemorrhage, mass lesion, acute infarction, midline shift, or mass-effect. Acute infarct may be inapparent on noncontrast CT. Ventricles and sulci symmetric. Bone windows demonstrate no focal lesion.  CT CERVICAL SPINE FINDINGS  Solid-appearing instrumented ACDF C3-4. Facet DJD C4-5 allows 2-3 mm anterolisthesis, contributes to bilateral foraminal encroachment. There is facet DJD bilaterally C5-6 and C6-7. Negative for fracture. No prevertebral soft tissue swelling. Some streak artifact from dental restorations degrades  portions of the study.  IMPRESSION: 1. Negative for bleed or other acute intracranial process. 2. Negative for cervical fracture or dislocation. 3. Stable parenchymal atrophy and nonspecific white matter changes. 4. Mild anterolisthesis C4-5 may be secondary to facet disease. Flexion/extension radiographs would be required to exclude dynamic instability.   Electronically Signed   By: Arne Cleveland M.D.   On: 12/04/2013 08:08   Ct Chest Wo Contrast  12/23/2013   CLINICAL DATA:  80 year old with shortness of breath and pleural effusions. History of chronic diastolic congestive heart failure, COPD and hypothyroidism.  EXAM: CT CHEST WITHOUT CONTRAST  TECHNIQUE: Multidetector CT imaging of the chest was performed following the standard protocol without IV contrast.  COMPARISON:  Radiographs 12/22/2013.  CT 03/13/2006.  FINDINGS: Mediastinum: There are no enlarged mediastinal, hilar or axillary lymph nodes. The thyroid gland appears normal. Tracheobronchial calcifications are noted. There is a small hiatal hernia. The heart is mildly enlarged.There is mild atherosclerosis of the aorta, great vessels and coronary arteries.  Lungs/Pleura: There are small dependent bilateral pleural effusions. There is no pericardial effusion. Dependent opacities at both lung bases are consistent with atelectasis. There are patchy ground-glass opacities with an upper lobe predominance, possibly reflecting edema. There is no consolidation or suspicious pulmonary nodule.  Upper abdomen: There is an enlarging cyst in the upper pole of the right kidney, measuring 4.4 cm on image 49. The gallbladder is surgically absent. There is no adrenal mass.  Musculoskeletal/Chest wall: No chest wall masses or suspicious osseous findings demonstrated.  IMPRESSION: 1. Bilateral pleural effusions with associated bibasilar atelectasis. 2. Patchy ground-glass opacities in both lungs suggesting mild edema. No consolidation or suspicious pulmonary nodule. 3.  Mild cardiomegaly and atherosclerosis.   Electronically Signed   By: Camie Patience M.D.   On: 12/23/2013 09:49   Ct Cervical Spine Wo Contrast  12/04/2013   CLINICAL DATA:  FALL FALL  EXAM: CT HEAD WITHOUT CONTRAST  CT CERVICAL SPINE WITHOUT CONTRAST  TECHNIQUE: Multidetector CT imaging of the head and cervical spine was performed following the standard protocol without intravenous contrast. Multiplanar CT image reconstructions of the cervical spine were also generated.  COMPARISON:  10/12/2010  FINDINGS: CT HEAD FINDINGS  Atherosclerotic and physiologic intracranial calcifications. Fluid level in the sphenoid sinus. Stable asymmetric mineralization of basal ganglia right greater than left. Diffuse parenchymal atrophy. Patchy areas of hypoattenuation in deep and periventricular white matter bilaterally. Negative for acute intracranial hemorrhage, mass lesion, acute infarction, midline shift, or mass-effect. Acute infarct may be inapparent on noncontrast CT. Ventricles and sulci symmetric. Bone windows demonstrate no focal lesion.  CT CERVICAL SPINE FINDINGS  Solid-appearing instrumented ACDF C3-4. Facet DJD C4-5 allows 2-3 mm anterolisthesis, contributes to bilateral foraminal encroachment. There is facet DJD bilaterally C5-6 and C6-7. Negative for fracture. No prevertebral soft tissue swelling. Some streak artifact from dental restorations degrades portions of  the study.  IMPRESSION: 1. Negative for bleed or other acute intracranial process. 2. Negative for cervical fracture or dislocation. 3. Stable parenchymal atrophy and nonspecific white matter changes. 4. Mild anterolisthesis C4-5 may be secondary to facet disease. Flexion/extension radiographs would be required to exclude dynamic instability.   Electronically Signed   By: Arne Cleveland M.D.   On: 12/04/2013 08:08   Dg Abd 2 Views  12/26/2013   CLINICAL DATA:  Abdominal distension  EXAM: ABDOMEN - 2 VIEW  COMPARISON:  CT 12/25/2013  FINDINGS: Gas-filled  loops of large and small bowel which are not pathologically dilated. There is gas in the rectum. No intraperitoneal free air on the lateral decubitus projection.  IMPRESSION: No evidence of bowel obstruction.  No free air.   Electronically Signed   By: Suzy Bouchard M.D.   On: 12/26/2013 09:05   Dg Abd 2 Views  12/24/2013   CLINICAL DATA:  Bowel obstruction  EXAM: ABDOMEN - 2 VIEW  COMPARISON:  December 23, 2013  FINDINGS: Supine and left lateral decubitus images were obtained. Several loops of borderline dilated small bowel remain with scattered air-fluid levels. No free air is appreciable.  There is a total hip prosthesis in the right. There is postoperative change in the left proximal femur with resorption of the left femoral head.  IMPRESSION: The bowel gas pattern is consistent with ileus or a degree of partial obstruction. There is very little change compared to 1 day prior. No free air seen.   Electronically Signed   By: Lowella Grip M.D.   On: 12/24/2013 08:46   Dg Abd 2 Views  12/23/2013   CLINICAL DATA:  Abdominal pain  EXAM: ABDOMEN - 2 VIEW  COMPARISON:  None.  FINDINGS: Scattered large and small bowel gas is noted. Differential air-fluid levels are seen with mild small bowel dilatation. No definitive free air is seen. Degenerative changes of the lumbar spine are noted. Postsurgical changes are seen in both hips. Chronic changes of proximal left femur are noted.  IMPRESSION: Mild small bowel dilatation likely indicating a partial small bowel obstruction. Correlation with physical exam is recommended.   Electronically Signed   By: Inez Catalina M.D.   On: 12/23/2013 09:45     Assessment/Plan 1. SBO (small bowel obstruction) May have been ileus related to heart failure  2. Ileus of unspecified type abd still distended, but she denies nausea.  3. Acute on chronic diastolic heart failure Seems compensated at this time  4. Anxiety Mild and related to confusion and dementia  5.  Candida UTI treated in hospital  6. Coronary artery disease involving coronary bypass graft of native heart without angina pectoris Denies chest pain.  7. Acute encephalopathy Clearing, but she remains a little confused  8. Chronic pain Pain in many areas, but worst in the left hip where she had previous surgery. Ortho has evaluated and they do not recommend any further surgery.  9. Dementia, without behavioral disturbance progressing  10. Essential hypertension controlled  11. Pain in left hip Degenerative changes at area of prior surgery of femur  12. Right lower lobe pneumonia Has completed treatment

## 2013-12-31 ENCOUNTER — Encounter: Payer: Self-pay | Admitting: Nurse Practitioner

## 2013-12-31 ENCOUNTER — Non-Acute Institutional Stay (SKILLED_NURSING_FACILITY): Payer: Medicare Other | Admitting: Nurse Practitioner

## 2013-12-31 DIAGNOSIS — F329 Major depressive disorder, single episode, unspecified: Secondary | ICD-10-CM

## 2013-12-31 DIAGNOSIS — B3749 Other urogenital candidiasis: Secondary | ICD-10-CM

## 2013-12-31 DIAGNOSIS — M25552 Pain in left hip: Secondary | ICD-10-CM

## 2013-12-31 DIAGNOSIS — J42 Unspecified chronic bronchitis: Secondary | ICD-10-CM

## 2013-12-31 DIAGNOSIS — K56609 Unspecified intestinal obstruction, unspecified as to partial versus complete obstruction: Secondary | ICD-10-CM

## 2013-12-31 DIAGNOSIS — F3289 Other specified depressive episodes: Secondary | ICD-10-CM

## 2013-12-31 DIAGNOSIS — A6009 Herpesviral infection of other urogenital tract: Secondary | ICD-10-CM

## 2013-12-31 DIAGNOSIS — G629 Polyneuropathy, unspecified: Secondary | ICD-10-CM

## 2013-12-31 DIAGNOSIS — I10 Essential (primary) hypertension: Secondary | ICD-10-CM

## 2013-12-31 DIAGNOSIS — N189 Chronic kidney disease, unspecified: Secondary | ICD-10-CM

## 2013-12-31 DIAGNOSIS — K219 Gastro-esophageal reflux disease without esophagitis: Secondary | ICD-10-CM

## 2013-12-31 DIAGNOSIS — N179 Acute kidney failure, unspecified: Secondary | ICD-10-CM

## 2013-12-31 DIAGNOSIS — M25559 Pain in unspecified hip: Secondary | ICD-10-CM

## 2013-12-31 DIAGNOSIS — I5033 Acute on chronic diastolic (congestive) heart failure: Secondary | ICD-10-CM

## 2013-12-31 DIAGNOSIS — A6000 Herpesviral infection of urogenital system, unspecified: Secondary | ICD-10-CM

## 2013-12-31 DIAGNOSIS — D72829 Elevated white blood cell count, unspecified: Secondary | ICD-10-CM | POA: Insufficient documentation

## 2013-12-31 DIAGNOSIS — F32A Depression, unspecified: Secondary | ICD-10-CM

## 2013-12-31 DIAGNOSIS — G589 Mononeuropathy, unspecified: Secondary | ICD-10-CM

## 2013-12-31 DIAGNOSIS — F0390 Unspecified dementia without behavioral disturbance: Secondary | ICD-10-CM

## 2013-12-31 NOTE — Assessment & Plan Note (Signed)
12/21/13 decreased Cymbalta from 30mg  bid to daily due to more sleepiness during day. Stable.

## 2013-12-31 NOTE — Assessment & Plan Note (Signed)
Patient's leukocytosis trending down. Urine cultures came back with yeast. IV antibiotics were subsequently discontinued. Chest x-ray which was initially concerning for pneumonia however CT of the chest which was done was negative for any consolidation. Antibiotics were subsequently discontinued and patient will followup as outpatient.

## 2013-12-31 NOTE — Assessment & Plan Note (Signed)
Urine cultures came back positive for yeast. IV antibiotics were subsequently discontinued.

## 2013-12-31 NOTE — Assessment & Plan Note (Signed)
Gradual decline, [place] Nam[place] Stable during her hospital stay.

## 2013-12-31 NOTE — Assessment & Plan Note (Addendum)
Stable on Imdur 30mg . Last chest pain 06/30/13 relived with NTG and MaaLox. Cardiac enzymes negative x3. 2-D echo with EF of 55-60% with no wall motion abnormalities

## 2013-12-31 NOTE — Assessment & Plan Note (Signed)
Controlled, takes Bystolic 10mg, Losartan 50mg bid, and amlodipine 10mg   

## 2013-12-31 NOTE — Assessment & Plan Note (Signed)
Hx GI bleed, stable, Hgb11.9 07/14/11-11.6 09/29/39, off  Carafate 12/14/12 and  continue Omeprazole 40mg bid.   

## 2013-12-31 NOTE — Assessment & Plan Note (Signed)
Chronic cough  better since Furosemide. Continue Spiriva and Advair.

## 2013-12-31 NOTE — Assessment & Plan Note (Signed)
Stable on Gabapentin 100mg tid and Tylenol 650mg q8hr   

## 2013-12-31 NOTE — Progress Notes (Signed)
Patient ID: [name] [name], female   DOB: 03/25/1918, 80 y.o.   MRN: [id number]   Code Status: DNR  No Known Allergies  Chief Complaint  Patient presents with  . Medical Management of Chronic Issues  . Hospitalization Follow-up    HPI: Patient is a 80 y.o. female seen in the SNF at [facility] today for evaluation of hospital stay 12/22/13-12/28/13 for SBO, left hip pain, depression,  and chronic medical conditions.    Hospitalized 12/22/2013-12/28/13 for SBO (small bowel obstruction)-f/u BMP scheduled,  Acute encephalopathy,  Candida UTI, and  CHF   The patient presented to ED with c/o a pain that "my stomach feels terrible. I can't keep anything in my stomach." The patient had 2 episodes of emesis in the emergency department. In emergency department, WBC was noted to be 17.5. Urinalysis showed 7-10 WBCs. ProBNP was 4519. EKG shows sinus rhythm with nonspecific T-wave changes. Chest x-ray showed left lobe opacity suggestive of left pleural effusion. The patient was hemodynamically stable without any respiratory distress. Oxygen saturation was 96% on room air, creatinine was 1.50   Hospitalized 12/04/2013 -12/05/2013. 12/04/13 presented to ED following a fall at nursing home with associated left hip pain. Unable to obtain significant history from patient secondary to advanced dementia but she states that she fell and denies LOC. The patient was recently given a walker for ambulating and when she got out of bed early.he did not use her walker and fell hitting her head and the left hip to the floor. Orthopedics who have reviewed imaging and feel that it is a chronic problem with progressive erosion of the femoral head and protrusion of hardware and plan nonsurgical management.   Problem List Items Addressed This Visit   Acute on chronic diastolic heart failure     proBNP of 4519 on hospital admission. Noted crackles which has since resolved. Cardiac enzymes were cycled which were negative x3. 2-D  echo with EF of 55-60%. No wall motion abnormalities. CT of the chest with bilateral effusions and associated bibasilar atelectasis. Patchy groundglass opacities in both lungs suggesting mild edema. No consolidation, no suspicious pulmonary nodule. Mild cardiomegaly and atherosclerosis. Patient was initially placed on IV Lasix and had good diuresis. Patient was also maintained on Imdur. Patient improved clinically and shortness of breath improved. Patient was subsequently transitioned from IV Lasix to home dose of oral Lasix which she tolerated. Patient be discharged in stable and improved condition. On day of discharge patient was euvolemic. Update BMP scheduled.      Candida UTI     Urine cultures came back positive for yeast. IV antibiotics were subsequently discontinued.      Dementia     Gradual decline, takes Namenda and lives in [place]. Stable during her hospital stay.       Depression     12/21/13 decreased Cymbalta from 30mg  bid to daily due to more sleepiness during day. Stable.      GERD (gastroesophageal reflux disease)     Hx GI bleed, stable, Hgb11.9 07/14/11-11.6 09/29/39, off  Carafate 12/14/12 and  continue Omeprazole 40mg  bid.       Herpes genitalis in women     Takes Acyclovir chronic for suppression therapy.        HTN (hypertension) - Primary     Controlled, takes Bystolic 10mg , Losartan 50mg  bid, and amlodipine 10mg       Leukocytosis, unspecified     Patient's leukocytosis trending down. Urine cultures came back with yeast. IV antibiotics  were subsequently discontinued. Chest x-ray which was initially concerning for pneumonia however CT of the chest which was done was negative for any consolidation. Antibiotics were subsequently discontinued and patient will followup as outpatient.      Neuropathy     Stable on Gabapentin 100mg  tid and Tylenol 650mg  q8hr      Pain in left hip     12/04/13 Orthopedics who have reviewed imaging and feel that it is a chronic  problem with progressive erosion of the femoral head and protrusion of hardware and plan nonsurgical management. 12/21/13 reduced Tramadol from 50mg  q6hr to q8hr and Tylenol 650mg  from q6hr to q8hr due to sleeping more during day.  Continue Gabapentin and Celebrex.       Renal failure (ARF), acute on chronic     Baseline creatinine was 0.9-1.0. On admission patient was noted to be in acute on chronic kidney disease stage II to 3. Patient was noted to have a creatinine of 1.43. It was felt patient's acute on chronic kidney disease was secondary to prerenal azotemia secondary to acute on chronic diastolic heart failure. Patient was placed on IV Lasix and diuresis. Patient's renal function improved and was back to baseline by day of discharge. Patient was transitioned back to her home regimen of Lasix.      SBO (small bowel obstruction)     ED abd x-ray: consistent with this partial small bowel obstruction versus an ileus. Symptoms: nausea and vomiting and inability to keep food down. Improved on  bowel rest, supportive care, anti-emetics, pain medication. A general surgical consultation was also done and patient was followed by general surgery during the hospitalization. Patient improved clinically on a daily basis. Repeated abd x-ray showed resolution of partial small bowel obstruction versus ileus. Patient's electrolytes were repleted. Patient started to pass flatus and started to have bowel movements. Patient did not have any further abdominal pain. Patient did not have any further nausea or vomiting. Patient was subsequently started on a clear liquid diet which she tolerated and was advanced to a mechanical soft diet.     Unspecified chronic bronchitis     Chronic cough  better since Furosemide. Continue Spiriva and Advair.           Review of Systems:  Review of Systems  Constitutional: Positive for malaise/fatigue. Negative for fever, chills, weight loss and diaphoresis.  HENT: Positive  for hearing loss. Negative for congestion, ear pain and sore throat.   Eyes: Negative for pain, discharge and redness.  Respiratory: Positive for cough. Negative for sputum production, shortness of breath and wheezing.        Chronic cough.   Cardiovascular: Positive for leg swelling (trace in ankle, chronic, venous insufficiency). Negative for chest pain, orthopnea, claudication and PND.       Reported chest pain x1 06/30/13. Trace edema BLE  Gastrointestinal: Negative for heartburn, nausea, vomiting, abdominal pain, diarrhea, constipation and blood in stool.          Genitourinary: Positive for frequency. Negative for dysuria, urgency and flank pain.  Musculoskeletal: Positive for back pain, joint pain (left hip pain. ), myalgias and neck pain. Negative for falls.       C/o right rib pain.   Skin: Negative for itching and rash.       BLE chronic venous insufficiency with mild erythema lower 1/3 of the BLE. Lipo dermatosclerosis. R eyebrow sutures intact. Left wrist bruise. Multiple skin tears R arm/hand.   Neurological: Negative for dizziness, tingling, tremors,  speech change, focal weakness, seizures, loss of consciousness and weakness. Sensory change: periphearl neuropathy.  Endo/Heme/Allergies: Negative for environmental allergies and polydipsia. Does not bruise/bleed easily.  Psychiatric/Behavioral: Positive for memory loss. Negative for depression and hallucinations. The patient is not nervous/anxious and does not have insomnia.      Past Medical History  Diagnosis Date  . Hypertension   . COPD (chronic obstructive pulmonary disease)   . Chronic diastolic CHF (congestive heart failure)   . Coronary artery disease   . Renal disorder   . GERD (gastroesophageal reflux disease)   . Osteoporosis   . Syncope   . Dementia   . Anemia   . Parkinson disease   . Neuropathy   . Thyroid disease   . Hyperlipidemia   . Anxiety   . Depression   . Chronic pain 11/01/2011    Left hip with  degeneration at the site of previous prosthesis  . UTI (lower urinary tract infection) 02/07/2013  . Unspecified hypothyroidism 08/25/2012    10/21/13 TSH 2.772   . Unspecified constipation 10/19/2013    10/18/13 Senokot S bid.    Marland Kitchen Unspecified chronic bronchitis 10/16/2012  . Herpes genitalis in women 10/16/2012  . SBO (small bowel obstruction) 12/22/13   Past Surgical History  Procedure Laterality Date  . Fracture surgery    . Esophagogastroduodenoscopy  05/05/2012    Procedure: ESOPHAGOGASTRODUODENOSCOPY (EGD);  Surgeon: [name], MD;  Location: [facility] ENDOSCOPY;  Service: Endoscopy;  Laterality: N/A;  Pediatric upper endoscope   Social History:   reports that she has never smoked. She has never used smokeless tobacco. She reports that she does not drink alcohol or use illicit drugs.  Medications: Patient's Medications  New Prescriptions   No medications on file  Previous Medications   ACETAMINOPHEN (TYLENOL) 325 MG TABLET    Take 650 mg by mouth every 8 (eight) hours.    ACYCLOVIR (ZOVIRAX) 400 MG TABLET    Take 200 mg by mouth daily.   AMLODIPINE (NORVASC) 10 MG TABLET    Take 10 mg by mouth daily.   AMOXICILLIN (AMOXIL) 500 MG CAPSULE    Take 4 capsules by mouth prior to dental procedure as directed by the access dental care staff   ARTIFICIAL TEAR OP    Place 1-2 drops into both eyes 4 (four) times daily.   CALCIUM-VITAMIN D (OSCAL WITH D) 500-200 MG-UNIT PER TABLET    Take 1 tablet by mouth daily.   CELECOXIB (CELEBREX) 200 MG CAPSULE    Take 200 mg by mouth daily.   DULOXETINE (CYMBALTA) 30 MG CAPSULE    Take 30 mg by mouth daily.    FLUTICASONE-SALMETEROL (ADVAIR) 100-50 MCG/DOSE AEPB    Inhale 1 puff into the lungs every 12 (twelve) hours.   FUROSEMIDE (LASIX) 20 MG TABLET    Take 20 mg by mouth daily.   GABAPENTIN (NEURONTIN) 100 MG CAPSULE    Take 100 mg by mouth 3 (three) times daily.   IPRATROPIUM-ALBUTEROL (DUONEB) 0.5-2.5 (3) MG/3ML SOLN    Take 3 mLs by nebulization every  6 (six) hours as needed (shortness of breath).   ISOSORBIDE MONONITRATE (IMDUR) 30 MG 24 HR TABLET    Take 30 mg by mouth daily.   LEVOTHYROXINE (SYNTHROID, LEVOTHROID) 25 MCG TABLET    Take 25 mcg by mouth daily.    LOSARTAN (COZAAR) 50 MG TABLET    Take 50 mg by mouth 2 (two) times daily.   MEMANTINE HCL ER (NAMENDA XR) 7 MG  CP24    Take 14 mg by mouth daily.   NEBIVOLOL (BYSTOLIC) 10 MG TABLET    Take 1 tablet (10 mg total) by mouth daily.   OMEPRAZOLE (PRILOSEC) 40 MG CAPSULE    Take 40 mg by mouth 2 (two) times daily.   POTASSIUM CHLORIDE (MICRO-K) 10 MEQ CR CAPSULE    Take 10 mEq by mouth daily.    PROMETHAZINE (PHENERGAN) 12.5 MG SUPPOSITORY    Place 12.5 mg rectally every 4 (four) hours as needed for nausea or vomiting.   SENNA-DOCUSATE (SENOKOT S) 8.6-50 MG PER TABLET    Take 1 tablet by mouth 2 (two) times daily.   TIOTROPIUM (SPIRIVA) 18 MCG INHALATION CAPSULE    Place 18 mcg into inhaler and inhale daily.   TRAMADOL (ULTRAM) 50 MG TABLET    Take 1 tablet (50 mg total) by mouth every 8 (eight) hours. 1 by mouth three times daily (6 am, 2 pm, 10 pm)  Modified Medications   No medications on file  Discontinued Medications   No medications on file     Physical Exam: Physical Exam  Constitutional: She is oriented to person, place, and time. She appears well-developed and well-nourished.  HENT:  Head: Normocephalic and atraumatic.  Eyes: Conjunctivae and EOM are normal. Pupils are equal, round, and reactive to light.  Neck: Normal range of motion. No JVD present. No thyromegaly present.  Chronic decreased lateral ROM of the neck   Cardiovascular: Regular rhythm and normal heart sounds.   No murmur heard. HR 50-60s  Pulmonary/Chest: Effort normal. She has no wheezes. She has no rales.  Abdominal: Soft. Bowel sounds are normal.  Musculoskeletal: She exhibits edema and tenderness.  Mainly pain in neck and left hip. Trace edema BLE. Lipo dermatosclerosis BLE from knee down. R sided  rib cage tenderness when palpated, but not with deep breath.   Lymphadenopathy:    She has no cervical adenopathy.  Neurological: She is alert and oriented to person, place, and time. She has normal reflexes. No cranial nerve deficit. She exhibits normal muscle tone. Coordination normal.  Skin: Skin is warm and dry. No lesion and no rash noted. No erythema.  New skin lesion at bridge of her nose, new, scaly, superficial, non healing--11/17/12 Dermatology consultation: Bowen's disease-R leg. 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 BLE chronic venous insufficiency with mild erythema lower 1/3 of the BLE.  BLE chronic venous insufficiency with mild erythema lower 1/3 of the BLE. Lipo dermatosclerosis. R eyebrow sutures intact. Left wrist bruise. Multiple skin tears R arm/hand.    Psychiatric: Cognition and memory are impaired. She exhibits abnormal recent memory.    Filed Vitals:   12/31/13 1151  BP: 142/78  Pulse: 70  Temp: 98.5 F (36.9 C)  TempSrc: Tympanic  Resp: 20      Labs reviewed: Basic Metabolic Panel:  Recent Labs  16/10/96  05/20/13  10/21/13  12/24/13 0552  12/26/13 0600 12/27/13 0503 12/28/13 0550  NA  --   < >  --   < >  --   < >  --   < > 143 141 142  K  --   < >  --   < >  --   < >  --   < > 3.2* 3.2* 4.3  CL  --   --   --   --   --   < >  --   < > 96 103 106  CO2  --   --   --   --   --   < >  --   < > 27 25 23   GLUCOSE  --   --   --   --   --   < >  --   < > 87 94 97  BUN  --   < >  --   < >  --   < >  --   < > 23 23 23   CREATININE  --   < >  --   < >  --   < >  --   < > 1.14* 1.00 0.95  CALCIUM  --   --   --   --   --   < >  --   < > 9.3 8.9 8.8  MG  --   --   --   --   --   --  2.0  --   --   --   --   TSH 5.47  --  2.74  --  2.77  --   --   --   --   --   --   < > = values in this interval not displayed. CBC:  Recent Labs  12/04/13 0631  12/22/13 1745  12/26/13 0600 12/27/13 0503 12/28/13 0550  WBC 14.6*   < > 17.5*  < > 14.1* 13.9* 14.8*  NEUTROABS 11.5*  --  14.6*  --   --   --   --   HGB 12.9  < > 13.0  < > 13.1 12.5 11.9*  HCT 38.8  < > 38.9  < > 39.8 37.5 36.0  MCV 96.3  < > 94.4  < > 95.0 94.2 95.5  PLT 331  < > 542*  < > 496* 470* 469*  < > = values in this interval not displayed.  Past procedures:  06/07/13 CXR borderline cardiomegaly unchanged with new minimal pulmonary vascular congestion, patchy bibasilar atelectasis or pneumonitis appears new.   12/04/13 CT head and cervical spine w/o contrast:  IMPRESSION:  1. Negative for bleed or other acute intracranial process.  2. Negative for cervical fracture or dislocation.  3. Stable parenchymal atrophy and nonspecific white matter changes.  4. Mild anterolisthesis C4-5 may be secondary to facet disease.  Flexion/extension radiographs would be required to exclude dynamic  instability.  12/04/13 X-ray R wrist:  IMPRESSION: 1. Osteopenia and degenerative change without fracture or other acute abnormality.   12/04/13 X-ray left femur:  IMPRESSION: 1. Fragmentation/lysis of the left femoral head and neck with migration and erosion of previously placed fixation hardware into the supra-acetabular region.  12/04/13 X-ray L hip  IMPRESSION: 1. Interval lysis/fragmentation of femoral head and resultant migration of fixation hardware into the supra-acetabular region.   12/04/13 CXR  IMPRESSION: 1. Mild cardiomegaly and perihilar interstitial edema/infiltrates.   2. Interval increase pleural effusions.Abdominal series 12/23/2013,12/25/13, 12/26/2013  CT chest 12/23/2013  2 d echo 12/24/13    Assessment/Plan Problem List Items Addressed This Visit   Unspecified hypothyroidism (Chronic)     takes Levothyroxine daily, TSH 2.741 05/20/13. Update TSH      Unspecified constipation     10/18/13 Senokot S bid. Observe.     Unspecified chronic bronchitis     Chronic cough. Recent developing CHF aggravated her chronic cough.  It should be better since Furosemide.      Pain in left hip     chronic  since the left hip fx surgical repair 06/2010-pain is managed with Tramadol 50mg  qid,Tylenol 650mg  qid, Celebrex 200mg  daily. W/c for mobility when out of bed. X-ray 01/13/13 showed no new acute fracture, subluxation, or dislocation. Also showed old healed fracture deformity at the left femoral head, neck, and intertrochanteric region with orthopedic rod, plate and screws spanning form superolateral to the femoral head to the proximal shaft. Potion of the orthopedic screw appear outside the confines of the femoral head. s/p Orthopedic consultation.         Neuropathy     Stable on Gabapentin 100mg  tid and Tylenol 650mg  qid       Nausea with vomiting - Primary     Resolved.     HTN (hypertension)     Elevated Sbp, denied chest pain, headaches, vision changes, or dizziness. Continue Bystolic 10mg  and Losartan 50mg  bid. Added Furosemide should help blood pressure. Continue to observe.      GERD (gastroesophageal reflux disease)     Hx GI bleed, stable, Hgb11.9 07/14/11-11.6 09/29/39, off  Carafate 12/14/12 and  continue Omeprazole 40mg  bid.     Dementia     Gradual decline, takes Namenda [place]         CHF (congestive heart failure)     Improved O2 desaturation, cough, BLE edema. CXR 10/14/13 mild to moderate congestive heart failure. 10/18/13 BNP 502.8. Continue Furosemide 10mg  Kcl 10meq daily. Obtain Echocardiogram to evaluate further.      CAD (coronary artery disease) of artery bypass graft     Stable on Imdur 30mg . Last chest pain 06/30/13 relived with NTG and MaaLox.       Anxiety     takes Cymbalta 30mg  bid-stable.           Family/ Staff Communication: observe the patient.   Goals of Care: SNF  Labs/tests ordered: BMP

## 2013-12-31 NOTE — Assessment & Plan Note (Signed)
ED abd x-ray: consistent with this partial small bowel obstruction versus an ileus. Symptoms: nausea and vomiting and inability to keep food down. Improved on  bowel rest, supportive care, anti-emetics, pain medication. A general surgical consultation was also done and patient was followed by general surgery during the hospitalization. Patient improved clinically on a daily basis. Repeated abd x-ray showed resolution of partial small bowel obstruction versus ileus. Patient's electrolytes were repleted. Patient started to pass flatus and started to have bowel movements. Patient did not have any further abdominal pain. Patient did not have any further nausea or vomiting. Patient was subsequently started on a clear liquid diet which she tolerated and was advanced to a mechanical soft diet.

## 2013-12-31 NOTE — Assessment & Plan Note (Signed)
Baseline creatinine was 0.9-1.0. On admission patient was noted to be in acute on chronic kidney disease stage II to 3. Patient was noted to have a creatinine of 1.43. It was felt patient's acute on chronic kidney disease was secondary to prerenal azotemia secondary to acute on chronic diastolic heart failure. Patient was placed on IV Lasix and diuresis. Patient's renal function improved and was back to baseline by day of discharge. Patient was transitioned back to her home regimen of Lasix.

## 2013-12-31 NOTE — Assessment & Plan Note (Signed)
Takes Acyclovir chronic for suppression therapy.

## 2013-12-31 NOTE — Assessment & Plan Note (Signed)
12/04/13 Orthopedics who have reviewed imaging and feel that it is a chronic problem with progressive erosion of the femoral head and protrusion of hardware and plan nonsurgical management. 12/21/13 reduced Tramadol from 50mg  q6hr to q8hr and Tylenol 650mg  from q6hr to q8hr due to sleeping more during day.  Continue Gabapentin and Celebrex.

## 2013-12-31 NOTE — Assessment & Plan Note (Signed)
proBNP of 4519 on hospital admission. Noted crackles which has since resolved. Cardiac enzymes were cycled which were negative x3. 2-D echo with EF of 55-60%. No wall motion abnormalities. CT of the chest with bilateral effusions and associated bibasilar atelectasis. Patchy groundglass opacities in both lungs suggesting mild edema. No consolidation, no suspicious pulmonary nodule. Mild cardiomegaly and atherosclerosis. Patient was initially placed on IV Lasix and had good diuresis. Patient was also maintained on Imdur. Patient improved clinically and shortness of breath improved. Patient was subsequently transitioned from IV Lasix to home dose of oral Lasix which she tolerated. Patient be discharged in stable and improved condition. On day of discharge patient was euvolemic. Update BMP scheduled.

## 2014-01-01 ENCOUNTER — Inpatient Hospital Stay (HOSPITAL_COMMUNITY)
Admission: EM | Admit: 2014-01-01 | Discharge: 2014-01-11 | DRG: 393 | Disposition: E | Payer: Medicare Other | Attending: Internal Medicine | Admitting: Internal Medicine

## 2014-01-01 ENCOUNTER — Encounter (HOSPITAL_COMMUNITY): Payer: Self-pay | Admitting: Emergency Medicine

## 2014-01-01 DIAGNOSIS — E785 Hyperlipidemia, unspecified: Secondary | ICD-10-CM | POA: Diagnosis present

## 2014-01-01 DIAGNOSIS — Z66 Do not resuscitate: Secondary | ICD-10-CM | POA: Diagnosis present

## 2014-01-01 DIAGNOSIS — N179 Acute kidney failure, unspecified: Secondary | ICD-10-CM | POA: Diagnosis present

## 2014-01-01 DIAGNOSIS — R109 Unspecified abdominal pain: Secondary | ICD-10-CM

## 2014-01-01 DIAGNOSIS — M810 Age-related osteoporosis without current pathological fracture: Secondary | ICD-10-CM | POA: Diagnosis present

## 2014-01-01 DIAGNOSIS — K55059 Acute (reversible) ischemia of intestine, part and extent unspecified: Principal | ICD-10-CM | POA: Diagnosis present

## 2014-01-01 DIAGNOSIS — R112 Nausea with vomiting, unspecified: Secondary | ICD-10-CM | POA: Diagnosis present

## 2014-01-01 DIAGNOSIS — K219 Gastro-esophageal reflux disease without esophagitis: Secondary | ICD-10-CM | POA: Diagnosis present

## 2014-01-01 DIAGNOSIS — J449 Chronic obstructive pulmonary disease, unspecified: Secondary | ICD-10-CM | POA: Diagnosis present

## 2014-01-01 DIAGNOSIS — G9340 Encephalopathy, unspecified: Secondary | ICD-10-CM

## 2014-01-01 DIAGNOSIS — I5032 Chronic diastolic (congestive) heart failure: Secondary | ICD-10-CM | POA: Diagnosis present

## 2014-01-01 DIAGNOSIS — K922 Gastrointestinal hemorrhage, unspecified: Secondary | ICD-10-CM | POA: Diagnosis present

## 2014-01-01 DIAGNOSIS — E039 Hypothyroidism, unspecified: Secondary | ICD-10-CM | POA: Diagnosis present

## 2014-01-01 DIAGNOSIS — I10 Essential (primary) hypertension: Secondary | ICD-10-CM | POA: Diagnosis present

## 2014-01-01 DIAGNOSIS — Z8249 Family history of ischemic heart disease and other diseases of the circulatory system: Secondary | ICD-10-CM

## 2014-01-01 DIAGNOSIS — G20 Parkinson's disease: Secondary | ICD-10-CM | POA: Diagnosis present

## 2014-01-01 DIAGNOSIS — I2510 Atherosclerotic heart disease of native coronary artery without angina pectoris: Secondary | ICD-10-CM | POA: Diagnosis present

## 2014-01-01 DIAGNOSIS — G20A1 Parkinson's disease without dyskinesia, without mention of fluctuations: Secondary | ICD-10-CM | POA: Diagnosis present

## 2014-01-01 DIAGNOSIS — I509 Heart failure, unspecified: Secondary | ICD-10-CM | POA: Diagnosis present

## 2014-01-01 DIAGNOSIS — Z79899 Other long term (current) drug therapy: Secondary | ICD-10-CM

## 2014-01-01 DIAGNOSIS — K631 Perforation of intestine (nontraumatic): Secondary | ICD-10-CM | POA: Diagnosis present

## 2014-01-01 DIAGNOSIS — Z515 Encounter for palliative care: Secondary | ICD-10-CM

## 2014-01-01 DIAGNOSIS — R509 Fever, unspecified: Secondary | ICD-10-CM

## 2014-01-01 DIAGNOSIS — J4489 Other specified chronic obstructive pulmonary disease: Secondary | ICD-10-CM | POA: Diagnosis present

## 2014-01-01 DIAGNOSIS — Z9089 Acquired absence of other organs: Secondary | ICD-10-CM

## 2014-01-01 DIAGNOSIS — K55029 Acute infarction of small intestine, extent unspecified: Secondary | ICD-10-CM | POA: Diagnosis present

## 2014-01-01 DIAGNOSIS — F0390 Unspecified dementia without behavioral disturbance: Secondary | ICD-10-CM | POA: Diagnosis present

## 2014-01-01 NOTE — ED Notes (Signed)
Bed: [id number] Expected date: 12/15/2013 Expected time: 11:22 PM Means of arrival: Ambulance Comments: disoriented

## 2014-01-01 NOTE — ED Notes (Signed)
[facility] [address]

## 2014-01-02 ENCOUNTER — Emergency Department (HOSPITAL_COMMUNITY): Payer: Medicare Other

## 2014-01-02 ENCOUNTER — Encounter (HOSPITAL_COMMUNITY): Payer: Self-pay | Admitting: Internal Medicine

## 2014-01-02 DIAGNOSIS — I509 Heart failure, unspecified: Secondary | ICD-10-CM | POA: Diagnosis present

## 2014-01-02 DIAGNOSIS — G9340 Encephalopathy, unspecified: Secondary | ICD-10-CM

## 2014-01-02 DIAGNOSIS — Z515 Encounter for palliative care: Secondary | ICD-10-CM | POA: Diagnosis not present

## 2014-01-02 DIAGNOSIS — Z8249 Family history of ischemic heart disease and other diseases of the circulatory system: Secondary | ICD-10-CM | POA: Diagnosis not present

## 2014-01-02 DIAGNOSIS — K631 Perforation of intestine (nontraumatic): Secondary | ICD-10-CM | POA: Diagnosis present

## 2014-01-02 DIAGNOSIS — I5032 Chronic diastolic (congestive) heart failure: Secondary | ICD-10-CM | POA: Diagnosis present

## 2014-01-02 DIAGNOSIS — F0390 Unspecified dementia without behavioral disturbance: Secondary | ICD-10-CM | POA: Diagnosis present

## 2014-01-02 DIAGNOSIS — E785 Hyperlipidemia, unspecified: Secondary | ICD-10-CM | POA: Diagnosis present

## 2014-01-02 DIAGNOSIS — N179 Acute kidney failure, unspecified: Secondary | ICD-10-CM | POA: Diagnosis present

## 2014-01-02 DIAGNOSIS — K55029 Acute infarction of small intestine, extent unspecified: Secondary | ICD-10-CM | POA: Diagnosis present

## 2014-01-02 DIAGNOSIS — I10 Essential (primary) hypertension: Secondary | ICD-10-CM | POA: Diagnosis present

## 2014-01-02 DIAGNOSIS — M810 Age-related osteoporosis without current pathological fracture: Secondary | ICD-10-CM | POA: Diagnosis present

## 2014-01-02 DIAGNOSIS — I2510 Atherosclerotic heart disease of native coronary artery without angina pectoris: Secondary | ICD-10-CM | POA: Diagnosis present

## 2014-01-02 DIAGNOSIS — K55059 Acute (reversible) ischemia of intestine, part and extent unspecified: Secondary | ICD-10-CM | POA: Diagnosis present

## 2014-01-02 DIAGNOSIS — Z9089 Acquired absence of other organs: Secondary | ICD-10-CM | POA: Diagnosis not present

## 2014-01-02 DIAGNOSIS — E039 Hypothyroidism, unspecified: Secondary | ICD-10-CM | POA: Diagnosis present

## 2014-01-02 DIAGNOSIS — G20 Parkinson's disease: Secondary | ICD-10-CM | POA: Diagnosis present

## 2014-01-02 DIAGNOSIS — Z66 Do not resuscitate: Secondary | ICD-10-CM | POA: Diagnosis present

## 2014-01-02 DIAGNOSIS — R109 Unspecified abdominal pain: Secondary | ICD-10-CM

## 2014-01-02 DIAGNOSIS — J449 Chronic obstructive pulmonary disease, unspecified: Secondary | ICD-10-CM | POA: Diagnosis present

## 2014-01-02 DIAGNOSIS — Z79899 Other long term (current) drug therapy: Secondary | ICD-10-CM | POA: Diagnosis not present

## 2014-01-02 DIAGNOSIS — R112 Nausea with vomiting, unspecified: Secondary | ICD-10-CM | POA: Diagnosis present

## 2014-01-02 DIAGNOSIS — K219 Gastro-esophageal reflux disease without esophagitis: Secondary | ICD-10-CM | POA: Diagnosis present

## 2014-01-02 LAB — URINALYSIS, ROUTINE W REFLEX MICROSCOPIC
Glucose, UA: NEGATIVE mg/dL
Hgb urine dipstick: NEGATIVE
Ketones, ur: NEGATIVE mg/dL
NITRITE: NEGATIVE
PROTEIN: NEGATIVE mg/dL
Specific Gravity, Urine: 1.021 (ref 1.005–1.030)
Urobilinogen, UA: 0.2 mg/dL (ref 0.0–1.0)
pH: 5 (ref 5.0–8.0)

## 2014-01-02 LAB — COMPREHENSIVE METABOLIC PANEL
ALBUMIN: 2.1 g/dL — AB (ref 3.5–5.2)
ALK PHOS: 273 U/L — AB (ref 39–117)
ALT: 22 U/L (ref 0–35)
ANION GAP: 23 — AB (ref 5–15)
AST: 34 U/L (ref 0–37)
BUN: 59 mg/dL — AB (ref 6–23)
CHLORIDE: 102 meq/L (ref 96–112)
CO2: 18 mEq/L — ABNORMAL LOW (ref 19–32)
Calcium: 9.9 mg/dL (ref 8.4–10.5)
Creatinine, Ser: 1.52 mg/dL — ABNORMAL HIGH (ref 0.50–1.10)
GFR calc Af Amer: 33 mL/min — ABNORMAL LOW (ref >90)
GFR calc non Af Amer: 28 mL/min — ABNORMAL LOW (ref >90)
Glucose, Bld: 90 mg/dL (ref 70–99)
Potassium: 5.9 mEq/L — ABNORMAL HIGH (ref 3.7–5.3)
Sodium: 143 mEq/L (ref 137–147)
TOTAL PROTEIN: 7.1 g/dL (ref 6.0–8.3)
Total Bilirubin: 0.7 mg/dL (ref 0.3–1.2)

## 2014-01-02 LAB — LIPASE, BLOOD: Lipase: 28 U/L (ref 11–59)

## 2014-01-02 LAB — URINE MICROSCOPIC-ADD ON

## 2014-01-02 LAB — LACTIC ACID, PLASMA: Lactic Acid, Venous: 6.5 mmol/L — ABNORMAL HIGH (ref 0.5–2.2)

## 2014-01-02 LAB — CBC WITH DIFFERENTIAL/PLATELET
Basophils Absolute: 0 10*3/uL (ref 0.0–0.1)
Basophils Relative: 0 % (ref 0–1)
EOS PCT: 0 % (ref 0–5)
Eosinophils Absolute: 0 10*3/uL (ref 0.0–0.7)
HCT: 42.8 % (ref 36.0–46.0)
Hemoglobin: 13.9 g/dL (ref 12.0–15.0)
Lymphocytes Relative: 4 % — ABNORMAL LOW (ref 12–46)
Lymphs Abs: 0.8 10*3/uL (ref 0.7–4.0)
MCH: 31.6 pg (ref 26.0–34.0)
MCHC: 32.5 g/dL (ref 30.0–36.0)
MCV: 97.3 fL (ref 78.0–100.0)
MONO ABS: 2.6 10*3/uL — AB (ref 0.1–1.0)
Monocytes Relative: 13 % — ABNORMAL HIGH (ref 3–12)
Neutro Abs: 16.3 10*3/uL — ABNORMAL HIGH (ref 1.7–7.7)
Neutrophils Relative %: 83 % — ABNORMAL HIGH (ref 43–77)
PLATELETS: 386 10*3/uL (ref 150–400)
RBC: 4.4 MIL/uL (ref 3.87–5.11)
RDW: 16 % — ABNORMAL HIGH (ref 11.5–15.5)
WBC: 19.7 10*3/uL — AB (ref 4.0–10.5)

## 2014-01-02 LAB — TROPONIN I

## 2014-01-02 MED ORDER — MORPHINE SULFATE 4 MG/ML IJ SOLN
4.0000 mg | Freq: Once | INTRAMUSCULAR | Status: AC
Start: 2014-01-02 — End: 2014-01-02
  Administered 2014-01-02: 4 mg via INTRAVENOUS
  Filled 2014-01-02: qty 1

## 2014-01-02 MED ORDER — ONDANSETRON HCL 4 MG/2ML IJ SOLN
4.0000 mg | Freq: Once | INTRAMUSCULAR | Status: AC
  Administered 2014-01-02: 4 mg via INTRAVENOUS
  Filled 2014-01-02: qty 2

## 2014-01-02 MED ORDER — IOHEXOL 300 MG/ML  SOLN
50.0000 mL | Freq: Once | INTRAMUSCULAR | Status: AC | PRN
  Administered 2014-01-02: 50 mL via ORAL

## 2014-01-02 MED ORDER — LIDOCAINE VISCOUS 2 % MT SOLN
15.0000 mL | Freq: Once | OROMUCOSAL | Status: DC
  Filled 2014-01-02: qty 15

## 2014-01-02 MED ORDER — PIPERACILLIN-TAZOBACTAM 3.375 G IVPB: 3.3750 g | Freq: Once | INTRAVENOUS | Status: DC

## 2014-01-02 MED ORDER — MORPHINE SULFATE 4 MG/ML IJ SOLN
6.0000 mg | Freq: Once | INTRAMUSCULAR | Status: AC
  Administered 2014-01-02: 6 mg via INTRAVENOUS
  Filled 2014-01-02: qty 2

## 2014-01-02 MED ORDER — SODIUM CHLORIDE 0.9 % IV SOLN
1000.0000 mL | INTRAVENOUS | Status: DC
  Administered 2014-01-02: 1000 mL via INTRAVENOUS

## 2014-01-02 MED ORDER — SODIUM CHLORIDE 0.9 % IV SOLN
1000.0000 mL | Freq: Once | INTRAVENOUS | Status: AC
  Administered 2014-01-02: 1000 mL via INTRAVENOUS

## 2014-01-02 MED ORDER — PIPERACILLIN-TAZOBACTAM 3.375 G IVPB 30 MIN: 3.3750 g | Freq: Once | INTRAVENOUS | Status: DC

## 2014-01-02 MED ORDER — SODIUM CHLORIDE 0.9 % IV SOLN
1.0000 mg/h | INTRAVENOUS | Status: DC
  Administered 2014-01-02: 1 mg/h via INTRAVENOUS
  Filled 2014-01-02 (×3): qty 10

## 2014-01-03 LAB — URINE CULTURE: Colony Count: >=100000

## 2014-01-05 LAB — CULTURE, BLOOD (ROUTINE X 2)

## 2014-01-11 NOTE — ED Notes (Signed)
Monitor at bedside changed to comfort measures,  Family offered for chaplain services,

## 2014-01-11 NOTE — ED Notes (Signed)
Family at bedside,  Son and daughter in Social worker

## 2014-01-11 NOTE — ED Notes (Signed)
NG attempted by this writer,  Olena Leatherwood RN , Lillibeth RNand Dr Patria Mane,  Unsuccessful NG,  Pt vomited 4 times while trying to induce NG tube,  Suction used .  Pt was placed on 4L [place]  O2.

## 2014-01-11 NOTE — Discharge Summary (Signed)
Patient have expired while still in ER at 7:54 AM cause of death bowel necrosis with perforation. Patient was comfort care only.   Loy Mccartt 10:11 PM

## 2014-01-11 NOTE — ED Provider Notes (Signed)
CSN: [id number]     Arrival date & time 12/13/2013  2331 History   First MD Initiated Contact with Patient 01-14-2014 0006     Chief Complaint  Patient presents with  . Hematemesis     Level V caveat: Altered mental status  HPI Patient is brought to the emergency department after reports of hematemesis that began today.  7 hospitalization for partial small bowel obstruction, pneumonia, question for ileus.  Her symptoms have resolved and she was discharged home.  She was discharged 5 days ago.  Since then her mental status has continued to get worse and she has had decreased oral intake.  Today she began vomiting.  Family reports new abdominal distention    Past Medical History  Diagnosis Date  . Hypertension   . COPD (chronic obstructive pulmonary disease)   . Chronic diastolic CHF (congestive heart failure)   . Coronary artery disease   . Renal disorder   . GERD (gastroesophageal reflux disease)   . Osteoporosis   . Syncope   . Dementia   . Anemia   . Parkinson disease   . Neuropathy   . Thyroid disease   . Hyperlipidemia   . Anxiety   . Depression   . 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 UTI (lower urinary tract infection) 02/07/2013  . Unspecified hypothyroidism 08/25/2012    10/21/13 TSH 2.772   . Unspecified constipation 10/19/2013    10/18/13 Senokot S bid.    Marland Kitchen Unspecified chronic bronchitis 10/16/2012  . Herpes genitalis in women 10/16/2012  . SBO (small bowel obstruction) 12/22/13   Past Surgical History  Procedure Laterality Date  . Fracture surgery    . Esophagogastroduodenoscopy  05/05/2012    Procedure: ESOPHAGOGASTRODUODENOSCOPY (EGD);  Surgeon: [name], MD;  Location: [facility] ENDOSCOPY;  Service: Endoscopy;  Laterality: N/A;  Pediatric upper endoscope   Family History  Problem Relation Age of Onset  . Hypertension Sister   . Hypertension Son    History  Substance Use Topics  . Smoking status: Never  Smoker   . Smokeless tobacco: Never Used  . Alcohol Use: No   OB History   Grav Para Term Preterm Abortions TAB SAB Ect Mult Living                 Review of Systems  Unable to perform ROS     Allergies  Review of patient's allergies indicates no known allergies.  Home Medications   Prior to Admission medications   Medication Sig Start Date End Date Taking? Authorizing Provider  acetaminophen (TYLENOL) 325 MG tablet Take 650 mg by mouth every 8 (eight) hours.    Yes Historical Provider, MD  acyclovir (ZOVIRAX) 400 MG tablet Take 400 mg by mouth daily.    Yes Historical Provider, MD  amLODipine (NORVASC) 10 MG tablet Take 10 mg by mouth daily.   Yes Historical Provider, MD  amoxicillin (AMOXIL) 500 MG capsule Take 4 capsules by mouth prior to dental procedure as directed by the access dental care staff   Yes Historical Provider, MD  ARTIFICIAL TEAR OP Place 1-2 drops into both eyes 4 (four) times daily.   Yes Historical Provider, MD  calcium-vitamin D (OSCAL WITH D) 500-200 MG-UNIT per tablet Take 1 tablet by mouth daily.   Yes Historical Provider, MD  celecoxib (CELEBREX) 200 MG capsule Take 200 mg by mouth daily.   Yes Historical Provider, MD  DULoxetine (CYMBALTA)  30 MG capsule Take 30 mg by mouth daily.    Yes Historical Provider, MD  Fluticasone-Salmeterol (ADVAIR) 100-50 MCG/DOSE AEPB Inhale 1 puff into the lungs every 12 (twelve) hours.   Yes Historical Provider, MD  furosemide (LASIX) 20 MG tablet Take 20 mg by mouth daily.   Yes Historical Provider, MD  gabapentin (NEURONTIN) 100 MG capsule Take 100 mg by mouth 3 (three) times daily.   Yes Historical Provider, MD  ipratropium-albuterol (DUONEB) 0.5-2.5 (3) MG/3ML SOLN Take 3 mLs by nebulization every 6 (six) hours as needed (shortness of breath).   Yes Historical Provider, MD  isosorbide mononitrate (IMDUR) 30 MG 24 hr tablet Take 30 mg by mouth daily.   Yes Historical Provider, MD  levothyroxine (SYNTHROID, LEVOTHROID) 25  MCG tablet Take 25 mcg by mouth daily.    Yes Historical Provider, MD  losartan (COZAAR) 50 MG tablet Take 50 mg by mouth 2 (two) times daily.   Yes Historical Provider, MD  Memantine HCl ER (NAMENDA XR) 7 MG CP24 Take 14 mg by mouth daily.   Yes Historical Provider, MD  nebivolol (BYSTOLIC) 10 MG tablet Take 1 tablet (10 mg total) by mouth daily. 09/28/13  Yes Man Mast X, NP  omeprazole (PRILOSEC) 40 MG capsule Take 40 mg by mouth 2 (two) times daily.   Yes Historical Provider, MD  potassium chloride (MICRO-K) 10 MEQ CR capsule Take 10 mEq by mouth daily.    Yes Historical Provider, MD  promethazine (PHENERGAN) 12.5 MG suppository Place 12.5 mg rectally every 4 (four) hours as needed for nausea or vomiting.   Yes Historical Provider, MD  senna-docusate (SENOKOT S) 8.6-50 MG per tablet Take 1 tablet by mouth 2 (two) times daily. 10/19/13  Yes Man Mast X, NP  tiotropium (SPIRIVA) 18 MCG inhalation capsule Place 18 mcg into inhaler and inhale daily.   Yes Historical Provider, MD  traMADol (ULTRAM) 50 MG tablet Take 1 tablet (50 mg total) by mouth every 8 (eight) hours. 1 by mouth three times daily (6 am, 2 pm, 10 pm) 12/28/13  Yes Rodolph Bong, MD   BP 123/51  Pulse 60  Resp 20  SpO2 66% Physical Exam  Nursing note and vitals reviewed. Constitutional: She appears well-developed and well-nourished. No distress.  HENT:  Head: Normocephalic and atraumatic.  Eyes: EOM are normal.  Neck: Normal range of motion.  Cardiovascular: Normal rate, regular rhythm and normal heart sounds.   Pulmonary/Chest: Effort normal and breath sounds normal.  Abdominal:  Diffuse tenderness.  Guarding without rebound.    Musculoskeletal: Normal range of motion.  Neurological:  Response to pain.    Skin: Skin is warm and dry.  Psychiatric: She has a normal mood and affect. Judgment normal.    ED Course  NG placement Performed by: Lyanne Co Authorized by: Lyanne Co Consent: Verbal consent  obtained. Risks and benefits: risks, benefits and alternatives were discussed Consent given by: guardian Required items: required blood products, implants, devices, and special equipment available Patient identity confirmed: arm band Local anesthesia used: no Patient sedated: no Patient tolerance: Patient tolerated the procedure well with no immediate complications. Comments: Unable to pass the NG tube after several attempts. No immediate complications. NG aborted, family updated       CRITICAL CARE Performed by: Lyanne Co Total critical care time: 40 Critical care time was exclusive of separately billable procedures and treating other patients. Critical care was necessary to treat or prevent imminent or life-threatening deterioration. Critical care  was time spent personally by me on the following activities: development of treatment plan with patient and/or surrogate as well as nursing, discussions with consultants, evaluation of patient's response to treatment, examination of patient, obtaining history from patient or surrogate, ordering and performing treatments and interventions, ordering and review of laboratory studies, ordering and review of radiographic studies, pulse oximetry and re-evaluation of patient's condition.  Labs Review Labs Reviewed  CBC WITH DIFFERENTIAL - Abnormal; Notable for the following:    WBC 19.7 (*)    RDW 16.0 (*)    Neutrophils Relative % 83 (*)    Lymphocytes Relative 4 (*)    Monocytes Relative 13 (*)    Neutro Abs 16.3 (*)    Monocytes Absolute 2.6 (*)    All other components within normal limits  COMPREHENSIVE METABOLIC PANEL - Abnormal; Notable for the following:    Potassium 5.9 (*)    CO2 18 (*)    BUN 59 (*)    Creatinine, Ser 1.52 (*)    Albumin 2.1 (*)    Alkaline Phosphatase 273 (*)    GFR calc non Af Amer 28 (*)    GFR calc Af Amer 33 (*)    Anion gap 23 (*)    All other components within normal limits  LACTIC ACID, PLASMA -  Abnormal; Notable for the following:    Lactic Acid, Venous 6.5 (*)    All other components within normal limits  URINALYSIS, ROUTINE W REFLEX MICROSCOPIC - Abnormal; Notable for the following:    Color, Urine AMBER (*)    APPearance CLOUDY (*)    Bilirubin Urine SMALL (*)    Leukocytes, UA MODERATE (*)    All other components within normal limits  URINE MICROSCOPIC-ADD ON - Abnormal; Notable for the following:    Squamous Epithelial / LPF MANY (*)    Bacteria, UA MANY (*)    All other components within normal limits  CULTURE, BLOOD (ROUTINE X 2)  CULTURE, BLOOD (ROUTINE X 2)  URINE CULTURE  LIPASE, BLOOD  TROPONIN I    Imaging Review Ct Abdomen Pelvis Wo Contrast  12/11/2013   CLINICAL DATA:  Abdominal pain and distention.  Hematemesis.  EXAM: CT CHEST, ABDOMEN AND PELVIS WITHOUT CONTRAST  TECHNIQUE: Multidetector CT imaging of the chest, abdomen and pelvis was performed following the standard protocol without IV contrast.  COMPARISON:  10/16/2010  FINDINGS: CT CHEST FINDINGS  THORACIC INLET/BODY WALL:  No acute abnormality.  MEDIASTINUM:  Chronic mild cardiomegaly. Diffuse coronary atherosclerosis. No pericardial effusion. Gastroesophageal reflux to the thoracic inlet.  LUNG WINDOWS:  Small bilateral pleural effusions, left more than right. Patchy ground-glass density is likely atelectasis. No overt edema.  OSSEOUS:  No acute fracture.  No suspicious lytic or blastic lesions.  CT ABDOMEN AND PELVIS FINDINGS  Liver: No focal abnormality.  Biliary: Cholecystectomy. Reservoir effect and age likely accounts for common bile duct enlargement.  Pancreas: Unremarkable.  Spleen: Unremarkable.  Adrenals: Unremarkable.  Kidneys and ureters: Interpolar cyst on the right, herniating into the renal pelvis. No hydronephrosis.  Bladder: Decompressed, limiting evaluation.  Reproductive: Hysterectomy.  Bowel: There is diffuse pneumoperitoneum, although still small volume. Gas present in the interloop  peritoneal spaces clustered around in the mesenteries of the central small bowel. In this region, there is dilated small bowel with fluid levels and pneumatosis. A frank breech in the posterior wall of 1 of these loops is visible, with focal accumulation of interloop gas. Diffuse dilation of small bowel which is likely  reactive ileus. The colon is relatively decompressed.  Vascular: No acute abnormality.  OSSEOUS: No acute abnormalities. The patient is status post fixation of a left femoral neck fracture. There is pseudoarthrosis across the fracture however, with chronic displacement of the dynamic hip screw, now a resting in the left acetabulum where there is bony remodeling.  These results were called by telephone at the time of interpretation on 01/04/2014 at 4:58 am to Dr. Azalia Bilis , who verbally acknowledged these results.  IMPRESSION: 1. Mid small bowel perforation with free pneumoperitoneum. Bowel necrosis is the probable underlying etiology as no diverticulum, foreign body, or pre-existing obstruction identified. 2. Chronic findings are discussed above.   Electronically Signed   By: Tiburcio Pea M.D.   On: 12/22/2013 05:04   Ct Chest Wo Contrast  12/31/2013   CLINICAL DATA:  Abdominal pain and distention.  Hematemesis.  EXAM: CT CHEST, ABDOMEN AND PELVIS WITHOUT CONTRAST  TECHNIQUE: Multidetector CT imaging of the chest, abdomen and pelvis was performed following the standard protocol without IV contrast.  COMPARISON:  10/16/2010  FINDINGS: CT CHEST FINDINGS  THORACIC INLET/BODY WALL:  No acute abnormality.  MEDIASTINUM:  Chronic mild cardiomegaly. Diffuse coronary atherosclerosis. No pericardial effusion. Gastroesophageal reflux to the thoracic inlet.  LUNG WINDOWS:  Small bilateral pleural effusions, left more than right. Patchy ground-glass density is likely atelectasis. No overt edema.  OSSEOUS:  No acute fracture.  No suspicious lytic or blastic lesions.  CT ABDOMEN AND PELVIS FINDINGS  Liver:  No focal abnormality.  Biliary: Cholecystectomy. Reservoir effect and age likely accounts for common bile duct enlargement.  Pancreas: Unremarkable.  Spleen: Unremarkable.  Adrenals: Unremarkable.  Kidneys and ureters: Interpolar cyst on the right, herniating into the renal pelvis. No hydronephrosis.  Bladder: Decompressed, limiting evaluation.  Reproductive: Hysterectomy.  Bowel: There is diffuse pneumoperitoneum, although still small volume. Gas present in the interloop peritoneal spaces clustered around in the mesenteries of the central small bowel. In this region, there is dilated small bowel with fluid levels and pneumatosis. A frank breech in the posterior wall of 1 of these loops is visible, with focal accumulation of interloop gas. Diffuse dilation of small bowel which is likely reactive ileus. The colon is relatively decompressed.  Vascular: No acute abnormality.  OSSEOUS: No acute abnormalities. The patient is status post fixation of a left femoral neck fracture. There is pseudoarthrosis across the fracture however, with chronic displacement of the dynamic hip screw, now a resting in the left acetabulum where there is bony remodeling.  These results were called by telephone at the time of interpretation on 12/23/2013 at 4:58 am to Dr. Azalia Bilis , who verbally acknowledged these results.  IMPRESSION: 1. Mid small bowel perforation with free pneumoperitoneum. Bowel necrosis is the probable underlying etiology as no diverticulum, foreign body, or pre-existing obstruction identified. 2. Chronic findings are discussed above.   Electronically Signed   By: Tiburcio Pea M.D.   On: 12/22/2013 05:04   Dg Chest Portable 1 View  12/23/2013   CLINICAL DATA:  Hematemesis  EXAM: PORTABLE CHEST - 1 VIEW  COMPARISON:  12/22/2013  FINDINGS: Improved aeration of the retrocardiac lung. Small pleural effusions likely persists bilaterally. Low lung volumes with interstitial crowding. No edema or consolidation. No  pneumothorax. There is chronic cardiomegaly. Upper mediastinal contours are distorted from leftward rotation.  IMPRESSION: 1. Small bilateral pleural effusion. 2. Low lung volumes.   Electronically Signed   By: Tiburcio Pea M.D.   On: 12/16/2013 02:09  I  personally reviewed the imaging tests through PACS system I reviewed available ER/hospitalization records through the EMR    EKG Interpretation None      MDM   Final diagnoses:  Perforated bowel  Acute renal failure, unspecified acute renal failure type  Abdominal pain, unspecified abdominal location  Fever, unspecified fever cause   Patient obviously acutelyill on arrival.  Concern for small bowel obstruction.  White count noted.  Attempted placement of an NG tube with significant difficulty.  Patient was sent to the CT scan are without an NG tube in place.  CT demonstrates perforated small bowel.  I had a very long discussion with concerned family at the bedside.  They had already told me the pt was a DNR/DNI.  We discussed her advanced age, advanced dementia, severity of illness, renal failure, and quality of life.  We discussed operative options with significant chance for morbidity/mortality even with the procedure, and her certain mortality without surgery.  It was decided by family to focus on pt comfort and palliation only. I think given her presenting symptoms and comorbid problems this is the appropriate decision.  Pt was given morphine for obvious pain/discomfort and started on a morphine drip.  Plan was admission to the palliative care floor in the hospital. She however passed peacefully with her family at the bedside while still in the ER  Time of death:  87AM  Lyanne Co, MD 01/09/2014 2044

## 2014-01-11 NOTE — H&P (Signed)
PCP:  Kimber Relic, MD    Chief Complaint:  Coffee ground emesis  HPI: [name] [name] is a 80 y.o. female   has a past medical history of Hypertension; COPD (chronic obstructive pulmonary disease); Chronic diastolic CHF (congestive heart failure); Coronary artery disease; Renal disorder; GERD (gastroesophageal reflux disease); Osteoporosis; Syncope; Dementia; Anemia; Parkinson disease; Neuropathy; Thyroid disease; Hyperlipidemia; Anxiety; Depression; Chronic pain (11/01/2011); UTI (lower urinary tract infection) (02/07/2013); Unspecified hypothyroidism (08/25/2012); Unspecified constipation (10/19/2013); Unspecified chronic bronchitis (10/16/2012); Herpes genitalis in women (10/16/2012); and SBO (small bowel obstruction) (12/22/13).   Presented with  2 weeks hx of abdominal pain and distention nausea and vomiting. She was admitted on 8/13 and was found to have PNA and ileus. She was given zosyn and vanc and was consirvatively managed.  Subsequently CT of the chest   was negative for any consolidation. Antibiotics were subsequently discontinued.  She was thought to have some volume overload and was diuresed.  Surgical consult was called. Ileus has resolved and patient was discharged to SNF on 12/28/13. She was doing well while at SNF but rapidly declined on 8/22 and presented back to ER with coffee grown emesis, altered metal status and severe abdominal pain and distension. CT done and showed necrotic bowel with perforation with free air. Family wishes to concentrate on comfort care only. In ERPatient was started on morphin gtt and was given 6 mg morphine due to severe discomfort.   Hospitalist was called for admission for admission for comfort measure for small bowel perforation  Review of Systems:  unale to obtain Past Medical History: Past Medical History  Diagnosis Date  . Hypertension   . COPD (chronic obstructive pulmonary disease)   . Chronic diastolic CHF (congestive heart failure)   .  Coronary artery disease   . Renal disorder   . GERD (gastroesophageal reflux disease)   . Osteoporosis   . Syncope   . Dementia   . Anemia   . Parkinson disease   . Neuropathy   . Thyroid disease   . Hyperlipidemia   . Anxiety   . Depression   . 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 UTI (lower urinary tract infection) 02/07/2013  . Unspecified hypothyroidism 08/25/2012    10/21/13 TSH 2.772   . Unspecified constipation 10/19/2013    10/18/13 Senokot S bid.    Marland Kitchen Unspecified chronic bronchitis 10/16/2012  . Herpes genitalis in women 10/16/2012  . SBO (small bowel obstruction) 12/22/13   Past Surgical History  Procedure Laterality Date  . Fracture surgery    . Esophagogastroduodenoscopy  05/05/2012    Procedure: ESOPHAGOGASTRODUODENOSCOPY (EGD);  Surgeon: [name], MD;  Location: [facility] ENDOSCOPY;  Service: Endoscopy;  Laterality: N/A;  Pediatric upper endoscope     Medications: Prior to Admission medications   Medication Sig Start Date End Date Taking? Authorizing Provider  acetaminophen (TYLENOL) 325 MG tablet Take 650 mg by mouth every 8 (eight) hours.    Yes Historical Provider, MD  acyclovir (ZOVIRAX) 400 MG tablet Take 400 mg by mouth daily.    Yes Historical Provider, MD  amLODipine (NORVASC) 10 MG tablet Take 10 mg by mouth daily.   Yes Historical Provider, MD  amoxicillin (AMOXIL) 500 MG capsule Take 4 capsules by mouth prior to dental procedure as directed by the access dental care staff   Yes Historical Provider, MD  ARTIFICIAL TEAR OP Place 1-2 drops into both eyes 4 (four)  times daily.   Yes Historical Provider, MD  calcium-vitamin D (OSCAL WITH D) 500-200 MG-UNIT per tablet Take 1 tablet by mouth daily.   Yes Historical Provider, MD  celecoxib (CELEBREX) 200 MG capsule Take 200 mg by mouth daily.   Yes Historical Provider, MD  DULoxetine (CYMBALTA) 30 MG capsule Take 30 mg by mouth daily.    Yes Historical Provider, MD    Fluticasone-Salmeterol (ADVAIR) 100-50 MCG/DOSE AEPB Inhale 1 puff into the lungs every 12 (twelve) hours.   Yes Historical Provider, MD  furosemide (LASIX) 20 MG tablet Take 20 mg by mouth daily.   Yes Historical Provider, MD  gabapentin (NEURONTIN) 100 MG capsule Take 100 mg by mouth 3 (three) times daily.   Yes Historical Provider, MD  ipratropium-albuterol (DUONEB) 0.5-2.5 (3) MG/3ML SOLN Take 3 mLs by nebulization every 6 (six) hours as needed (shortness of breath).   Yes Historical Provider, MD  isosorbide mononitrate (IMDUR) 30 MG 24 hr tablet Take 30 mg by mouth daily.   Yes Historical Provider, MD  levothyroxine (SYNTHROID, LEVOTHROID) 25 MCG tablet Take 25 mcg by mouth daily.    Yes Historical Provider, MD  losartan (COZAAR) 50 MG tablet Take 50 mg by mouth 2 (two) times daily.   Yes Historical Provider, MD  Memantine HCl ER (NAMENDA XR) 7 MG CP24 Take 14 mg by mouth daily.   Yes Historical Provider, MD  nebivolol (BYSTOLIC) 10 MG tablet Take 1 tablet (10 mg total) by mouth daily. 09/28/13  Yes Man Mast X, NP  omeprazole (PRILOSEC) 40 MG capsule Take 40 mg by mouth 2 (two) times daily.   Yes Historical Provider, MD  potassium chloride (MICRO-K) 10 MEQ CR capsule Take 10 mEq by mouth daily.    Yes Historical Provider, MD  promethazine (PHENERGAN) 12.5 MG suppository Place 12.5 mg rectally every 4 (four) hours as needed for nausea or vomiting.   Yes Historical Provider, MD  senna-docusate (SENOKOT S) 8.6-50 MG per tablet Take 1 tablet by mouth 2 (two) times daily. 10/19/13  Yes Man Mast X, NP  tiotropium (SPIRIVA) 18 MCG inhalation capsule Place 18 mcg into inhaler and inhale daily.   Yes Historical Provider, MD  traMADol (ULTRAM) 50 MG tablet Take 1 tablet (50 mg total) by mouth every 8 (eight) hours. 1 by mouth three times daily (6 am, 2 pm, 10 pm) 12/28/13  Yes Rodolph Bong, MD    Allergies:  No Known Allergies  Social History:   From [facility]   reports that  she has never smoked. She has never used smokeless tobacco. She reports that she does not drink alcohol or use illicit drugs.    Family History: family history includes Hypertension in her sister and son.    Physical Exam: Patient Vitals for the past 24 hrs:  BP Pulse Resp SpO2  2014/01/24 0245 123/51 mmHg - 25 98 %  01/06/2014 2353 132/95 mmHg 83 26 95 %  12/13/2013 2351 132/95 mmHg 84 - 95 %  01/10/2014 2332 - - - 94 %    1. General:  in No Acute distress, but rapid shallow respirations 2. Psychological: somnolent non responsive 3. Head/ENT:   Dry Mucous Membranes                          Head Non traumatic, neck supple  Normal   Dentition 4. SKIN:  decreased Skin turgor,  Skin clean Dry and intact no rash 5. Heart: Regular rate and rhythm no Murmur, Rub or gallop 6. Lungs: extensive crackles   7. Abdomen:   generalized tender,   distended 8. Lower extremities: no clubbing, cyanosis, or edema 9. Neurologically Grossly intact, unable to perform thorough exam 10. MSK:  range of motion not tested  body mass index is unknown because there is no weight on file.   Labs on Admission:   Recent Labs  Jan 06, 2014 0147  NA 143  K 5.9*  CL 102  CO2 18*  GLUCOSE 90  BUN 59*  CREATININE 1.52*  CALCIUM 9.9    Recent Labs  Jan 06, 2014 0147  AST 34  ALT 22  ALKPHOS 273*  BILITOT 0.7  PROT 7.1  ALBUMIN 2.1*    Recent Labs  01-06-2014 0147  LIPASE 28    Recent Labs  01-06-14 0147  WBC 19.7*  NEUTROABS 16.3*  HGB 13.9  HCT 42.8  MCV 97.3  PLT 386    Recent Labs  01/06/2014 0510  TROPONINI <0.30   No results found for this basename: TSH, T4TOTAL, FREET3, T3FREE, THYROIDAB,  in the last 72 hours No results found for this basename: VITAMINB12, FOLATE, FERRITIN, TIBC, IRON, RETICCTPCT,  in the last 72 hours No results found for this basename: HGBA1C    The CrCl is unknown because both a height and weight (above a minimum accepted value) are required  for this calculation. ABG    Component Value Date/Time   HCO3 31.0* 02/28/2007 1106   TCO2 21 12/22/2013 1759     No results found for this basename: DDIMER     BNP (last 3 results)  Recent Labs  12/22/13 1858 12/24/13 0500 12/25/13 0600  PROBNP 4519.0* 2064.0* 3606.0*    There were no vitals filed for this visit.   Cultures:    Component Value Date/Time   SDES URINE, CATHETERIZED 12/22/2013 1908   SPECREQUEST NONE 12/22/2013 1908   CULT  Value: YEAST Performed at [facility] 12/22/2013 1908   REPTSTATUS 12/25/2013 FINAL 12/22/2013 1908    Radiological Exams on Admission: Ct Abdomen Pelvis Wo Contrast  01/06/14   CLINICAL DATA:  Abdominal pain and distention.  Hematemesis.  EXAM: CT CHEST, ABDOMEN AND PELVIS WITHOUT CONTRAST  TECHNIQUE: Multidetector CT imaging of the chest, abdomen and pelvis was performed following the standard protocol without IV contrast.  COMPARISON:  10/16/2010  FINDINGS: CT CHEST FINDINGS  THORACIC INLET/BODY WALL:  No acute abnormality.  MEDIASTINUM:  Chronic mild cardiomegaly. Diffuse coronary atherosclerosis. No pericardial effusion. Gastroesophageal reflux to the thoracic inlet.  LUNG WINDOWS:  Small bilateral pleural effusions, left more than right. Patchy ground-glass density is likely atelectasis. No overt edema.  OSSEOUS:  No acute fracture.  No suspicious lytic or blastic lesions.  CT ABDOMEN AND PELVIS FINDINGS  Liver: No focal abnormality.  Biliary: Cholecystectomy. Reservoir effect and age likely accounts for common bile duct enlargement.  Pancreas: Unremarkable.  Spleen: Unremarkable.  Adrenals: Unremarkable.  Kidneys and ureters: Interpolar cyst on the right, herniating into the renal pelvis. No hydronephrosis.  Bladder: Decompressed, limiting evaluation.  Reproductive: Hysterectomy.  Bowel: There is diffuse pneumoperitoneum, although still small volume. Gas present in the interloop peritoneal spaces clustered around in the mesenteries of  the central small bowel. In this region, there is dilated small bowel with fluid levels and pneumatosis. A frank breech in the posterior wall of 1 of these loops  is visible, with focal accumulation of interloop gas. Diffuse dilation of small bowel which is likely reactive ileus. The colon is relatively decompressed.  Vascular: No acute abnormality.  OSSEOUS: No acute abnormalities. The patient is status post fixation of a left femoral neck fracture. There is pseudoarthrosis across the fracture however, with chronic displacement of the dynamic hip screw, now a resting in the left acetabulum where there is bony remodeling.  These results were called by telephone at the time of interpretation on 01/20/14 at 4:58 am to Dr. Azalia Bilis , who verbally acknowledged these results.  IMPRESSION: 1. Mid small bowel perforation with free pneumoperitoneum. Bowel necrosis is the probable underlying etiology as no diverticulum, foreign body, or pre-existing obstruction identified. 2. Chronic findings are discussed above.   Electronically Signed   By: Tiburcio Pea M.D.   On: 01/20/14 05:04   Ct Chest Wo Contrast  01/20/14   CLINICAL DATA:  Abdominal pain and distention.  Hematemesis.  EXAM: CT CHEST, ABDOMEN AND PELVIS WITHOUT CONTRAST  TECHNIQUE: Multidetector CT imaging of the chest, abdomen and pelvis was performed following the standard protocol without IV contrast.  COMPARISON:  10/16/2010  FINDINGS: CT CHEST FINDINGS  THORACIC INLET/BODY WALL:  No acute abnormality.  MEDIASTINUM:  Chronic mild cardiomegaly. Diffuse coronary atherosclerosis. No pericardial effusion. Gastroesophageal reflux to the thoracic inlet.  LUNG WINDOWS:  Small bilateral pleural effusions, left more than right. Patchy ground-glass density is likely atelectasis. No overt edema.  OSSEOUS:  No acute fracture.  No suspicious lytic or blastic lesions.  CT ABDOMEN AND PELVIS FINDINGS  Liver: No focal abnormality.  Biliary: Cholecystectomy.  Reservoir effect and age likely accounts for common bile duct enlargement.  Pancreas: Unremarkable.  Spleen: Unremarkable.  Adrenals: Unremarkable.  Kidneys and ureters: Interpolar cyst on the right, herniating into the renal pelvis. No hydronephrosis.  Bladder: Decompressed, limiting evaluation.  Reproductive: Hysterectomy.  Bowel: There is diffuse pneumoperitoneum, although still small volume. Gas present in the interloop peritoneal spaces clustered around in the mesenteries of the central small bowel. In this region, there is dilated small bowel with fluid levels and pneumatosis. A frank breech in the posterior wall of 1 of these loops is visible, with focal accumulation of interloop gas. Diffuse dilation of small bowel which is likely reactive ileus. The colon is relatively decompressed.  Vascular: No acute abnormality.  OSSEOUS: No acute abnormalities. The patient is status post fixation of a left femoral neck fracture. There is pseudoarthrosis across the fracture however, with chronic displacement of the dynamic hip screw, now a resting in the left acetabulum where there is bony remodeling.  These results were called by telephone at the time of interpretation on 01-20-2014 at 4:58 am to Dr. Azalia Bilis , who verbally acknowledged these results.  IMPRESSION: 1. Mid small bowel perforation with free pneumoperitoneum. Bowel necrosis is the probable underlying etiology as no diverticulum, foreign body, or pre-existing obstruction identified. 2. Chronic findings are discussed above.   Electronically Signed   By: Tiburcio Pea M.D.   On: 01/20/2014 05:04   Dg Chest Portable 1 View  20-Jan-2014   CLINICAL DATA:  Hematemesis  EXAM: PORTABLE CHEST - 1 VIEW  COMPARISON:  12/22/2013  FINDINGS: Improved aeration of the retrocardiac lung. Small pleural effusions likely persists bilaterally. Low lung volumes with interstitial crowding. No edema or consolidation. No pneumothorax. There is chronic cardiomegaly. Upper  mediastinal contours are distorted from leftward rotation.  IMPRESSION: 1. Small bilateral pleural effusion. 2. Low lung volumes.  Electronically Signed   By: Tiburcio Pea M.D.   On: 12/19/2013 02:09    Chart has been reviewed  Assessment/Plan  79 yo F with perforated bowel due to bowel admitted for comfort care only.   Present on Admission:  . UGI bleed in the setting of perforated bowel comfort care. No intervention very poor prognosis . Perforated bowel - comfort care, prognosis hours to a day . Ischemic necrosis of small bowel- pain control . HTN (hypertension) - hold PO meds   Prophylaxis: none comfort care only patient actively passing  CODE STATUS:    DNR/DNI comfort care only  Other plan as per orders.  I have spent a total of 45 min on this admission  Jacklyn Branan 01/04/2014, 6:27 AM  [facility]  Pager [phone]   If 7AM-7PM, please contact the day team taking care of the patient  [email or web address]  Password [email or web address]

## 2014-01-11 DEATH — deceased

## 2014-12-28 IMAGING — CT CT CHEST W/O CM
1 of 2 series · 14 of 29 positions shown, 18 images · non-contrast
Comparison: Radiographs 12/22/2013.  CT 03/13/2006.

CLINICAL DATA: [AGE] with shortness of breath and pleural
effusions. History of chronic diastolic congestive heart failure,
COPD and hypothyroidism.

EXAM:
CT CHEST WITHOUT CONTRAST
TECHNIQUE: Multidetector CT imaging of the chest was performed following the
standard protocol without IV contrast..

[Series 4: rtn chest without st · axial · non-contrast · 0.71mm/px · z∈[+1041,+1246]mm · 14 of 49 slices shown, 18 images]
[im 4/49  mediastinal]
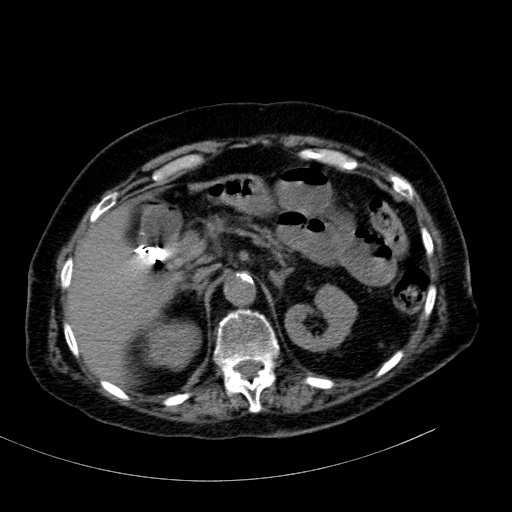
[im 4/49  lung]
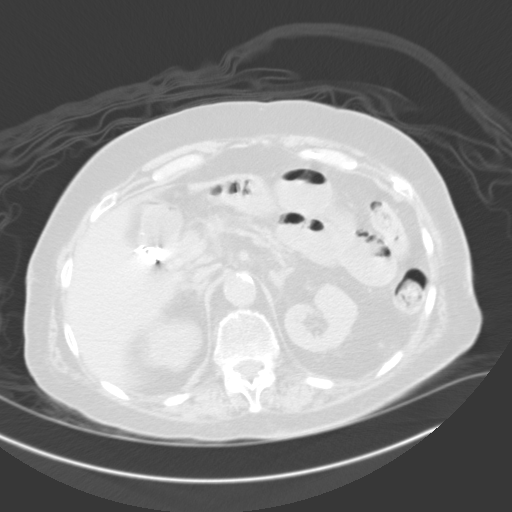
[im 7/49  lung]
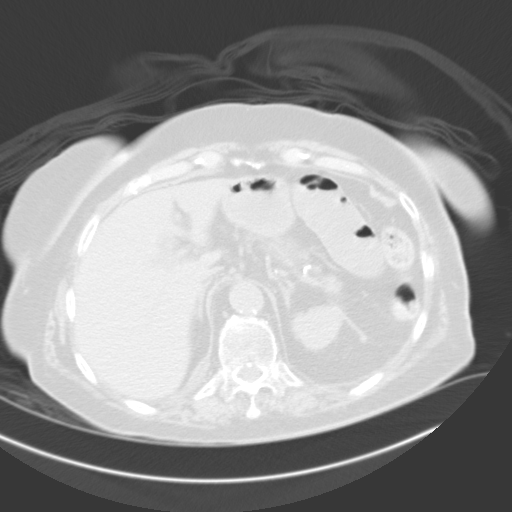
[im 11/49  lung]
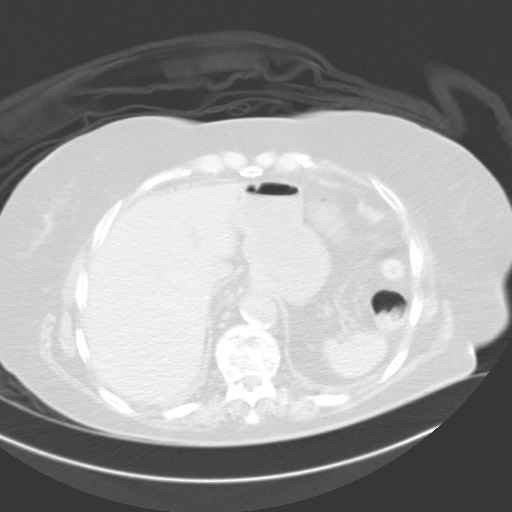
[im 14/49  lung]
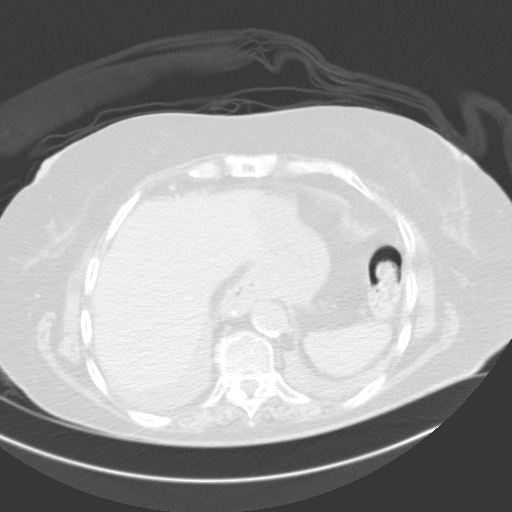
[im 18/49  mediastinal]
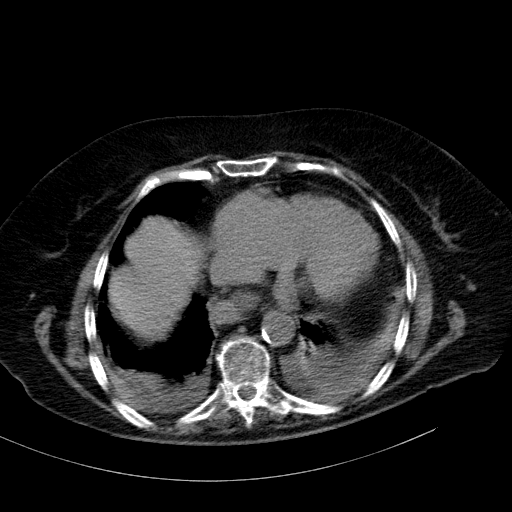
[im 18/49  lung]
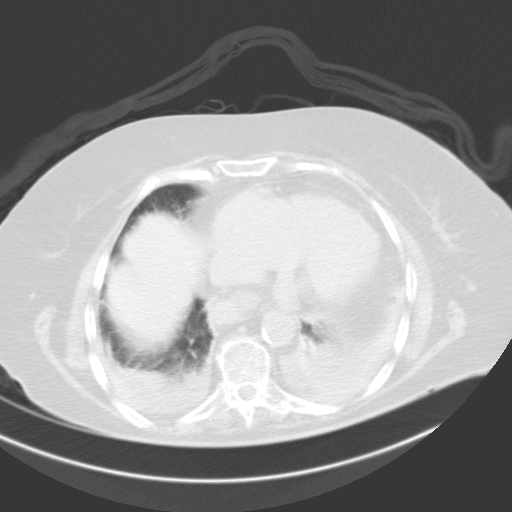
[im 21/49  lung]
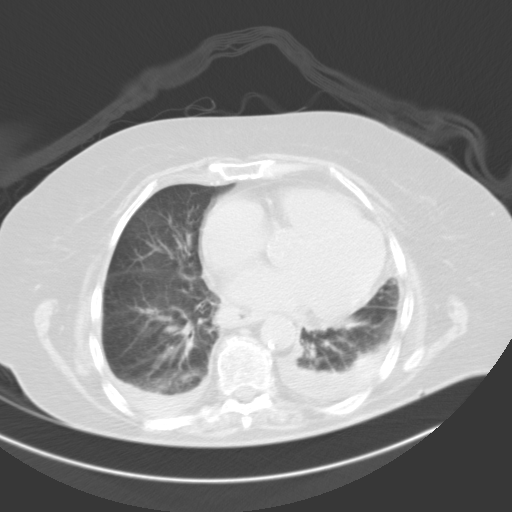
[im 23/49  lung]
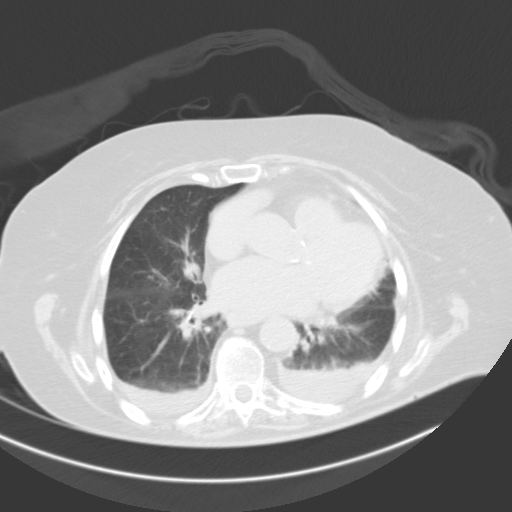
[im 25/49  lung]
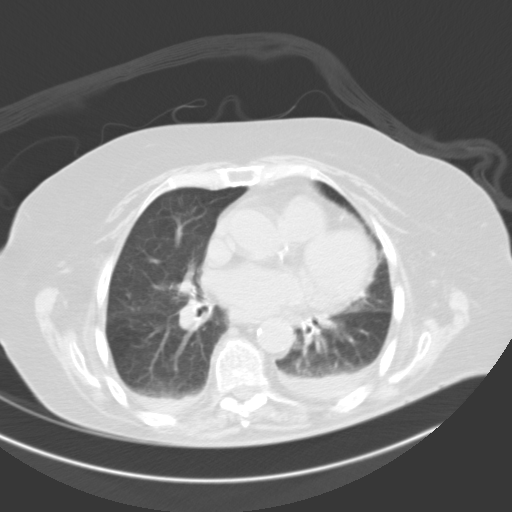
[im 28/49  mediastinal]
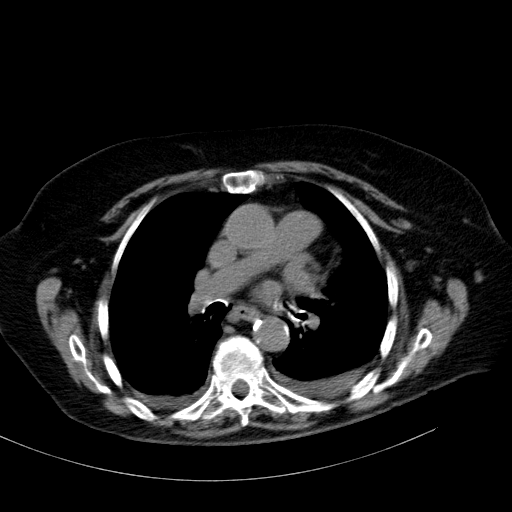
[im 28/49  lung]
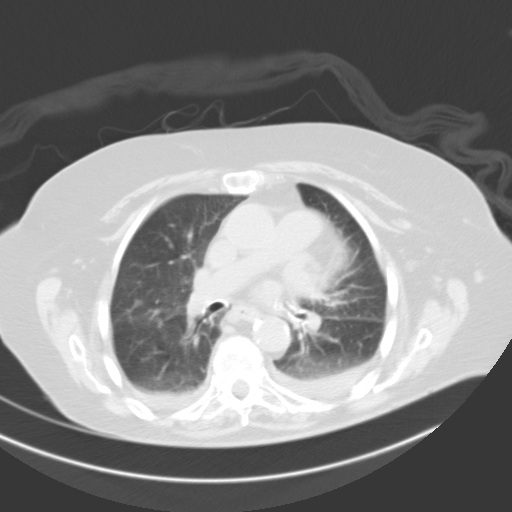
[im 31/49  lung]
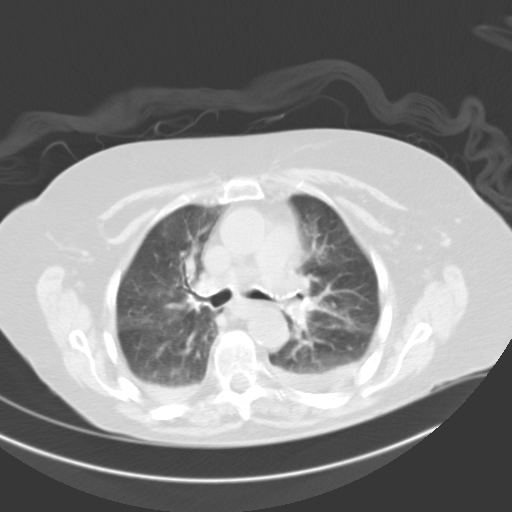
[im 35/49  lung]
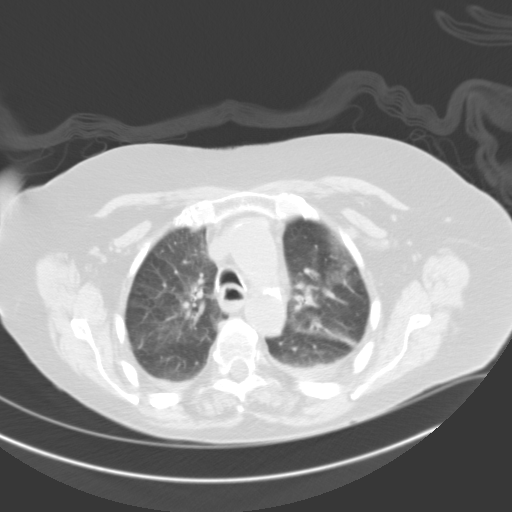
[im 38/49  lung]
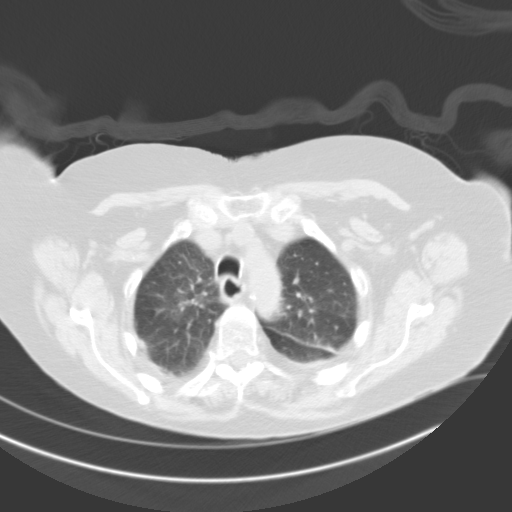
[im 42/49  mediastinal]
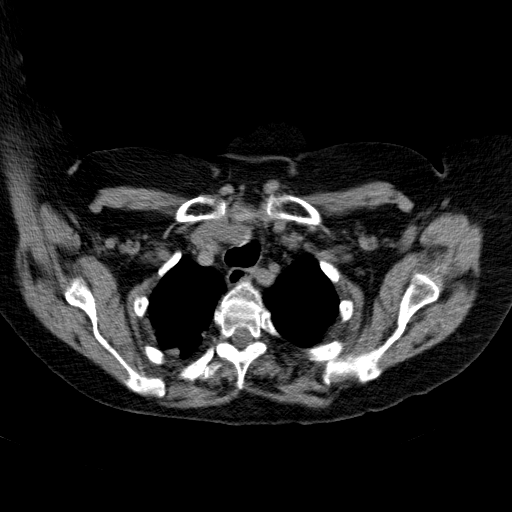
[im 42/49  lung]
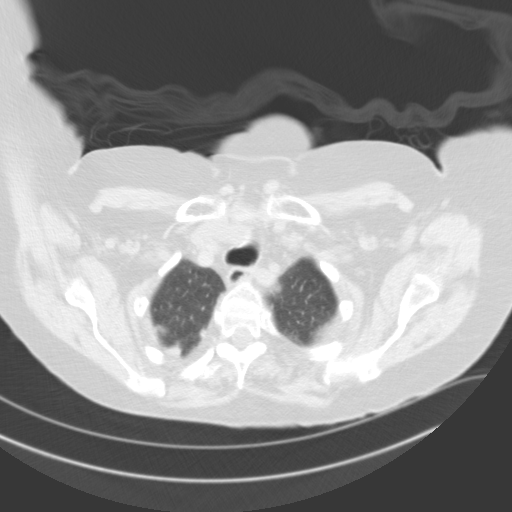
[im 45/49  lung]
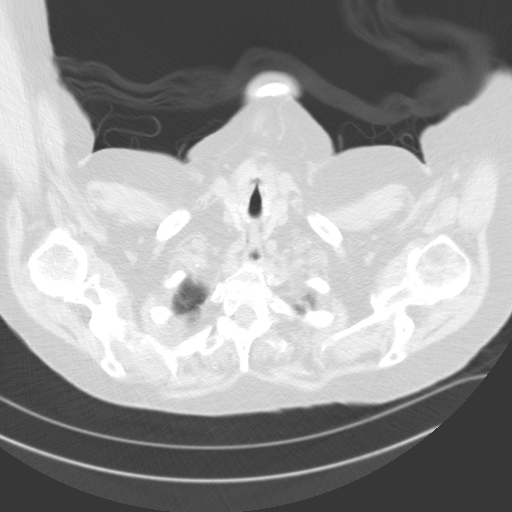

[14 of 29 positions shown; findings below may reference images not displayed]

FINDINGS: Mediastinum: There are no enlarged mediastinal, hilar or axillary
lymph nodes. The thyroid gland appears normal. Tracheobronchial
calcifications are noted. There is a small hiatal hernia. The heart
is mildly enlarged.There is mild atherosclerosis of the aorta, great
vessels and coronary arteries.

Lungs/Pleura: There are small dependent bilateral pleural effusions.
There is no pericardial effusion. Dependent opacities at both lung
bases are consistent with atelectasis. There are patchy ground-glass
opacities with an upper lobe predominance, possibly reflecting
edema. There is no consolidation or suspicious pulmonary nodule.

Upper abdomen: There is an enlarging cyst in the upper pole of the
right kidney, measuring 4.4 cm on image 49. The gallbladder is
surgically absent. There is no adrenal mass.

Musculoskeletal/Chest wall: No chest wall masses or suspicious
osseous findings demonstrated.
IMPRESSION: 1. Bilateral pleural effusions with associated bibasilar
atelectasis.
2. Patchy ground-glass opacities in both lungs suggesting mild
edema. No consolidation or suspicious pulmonary nodule.
3. Mild cardiomegaly and atherosclerosis.

## 2014-12-29 IMAGING — CR DG ABDOMEN 2V
2 series · 2 of 2 positions shown · non-contrast
Comparison: December 23, 2013

CLINICAL DATA: Bowel obstruction

EXAM:
ABDOMEN - 2 VIEW

[w abdomen decub *]
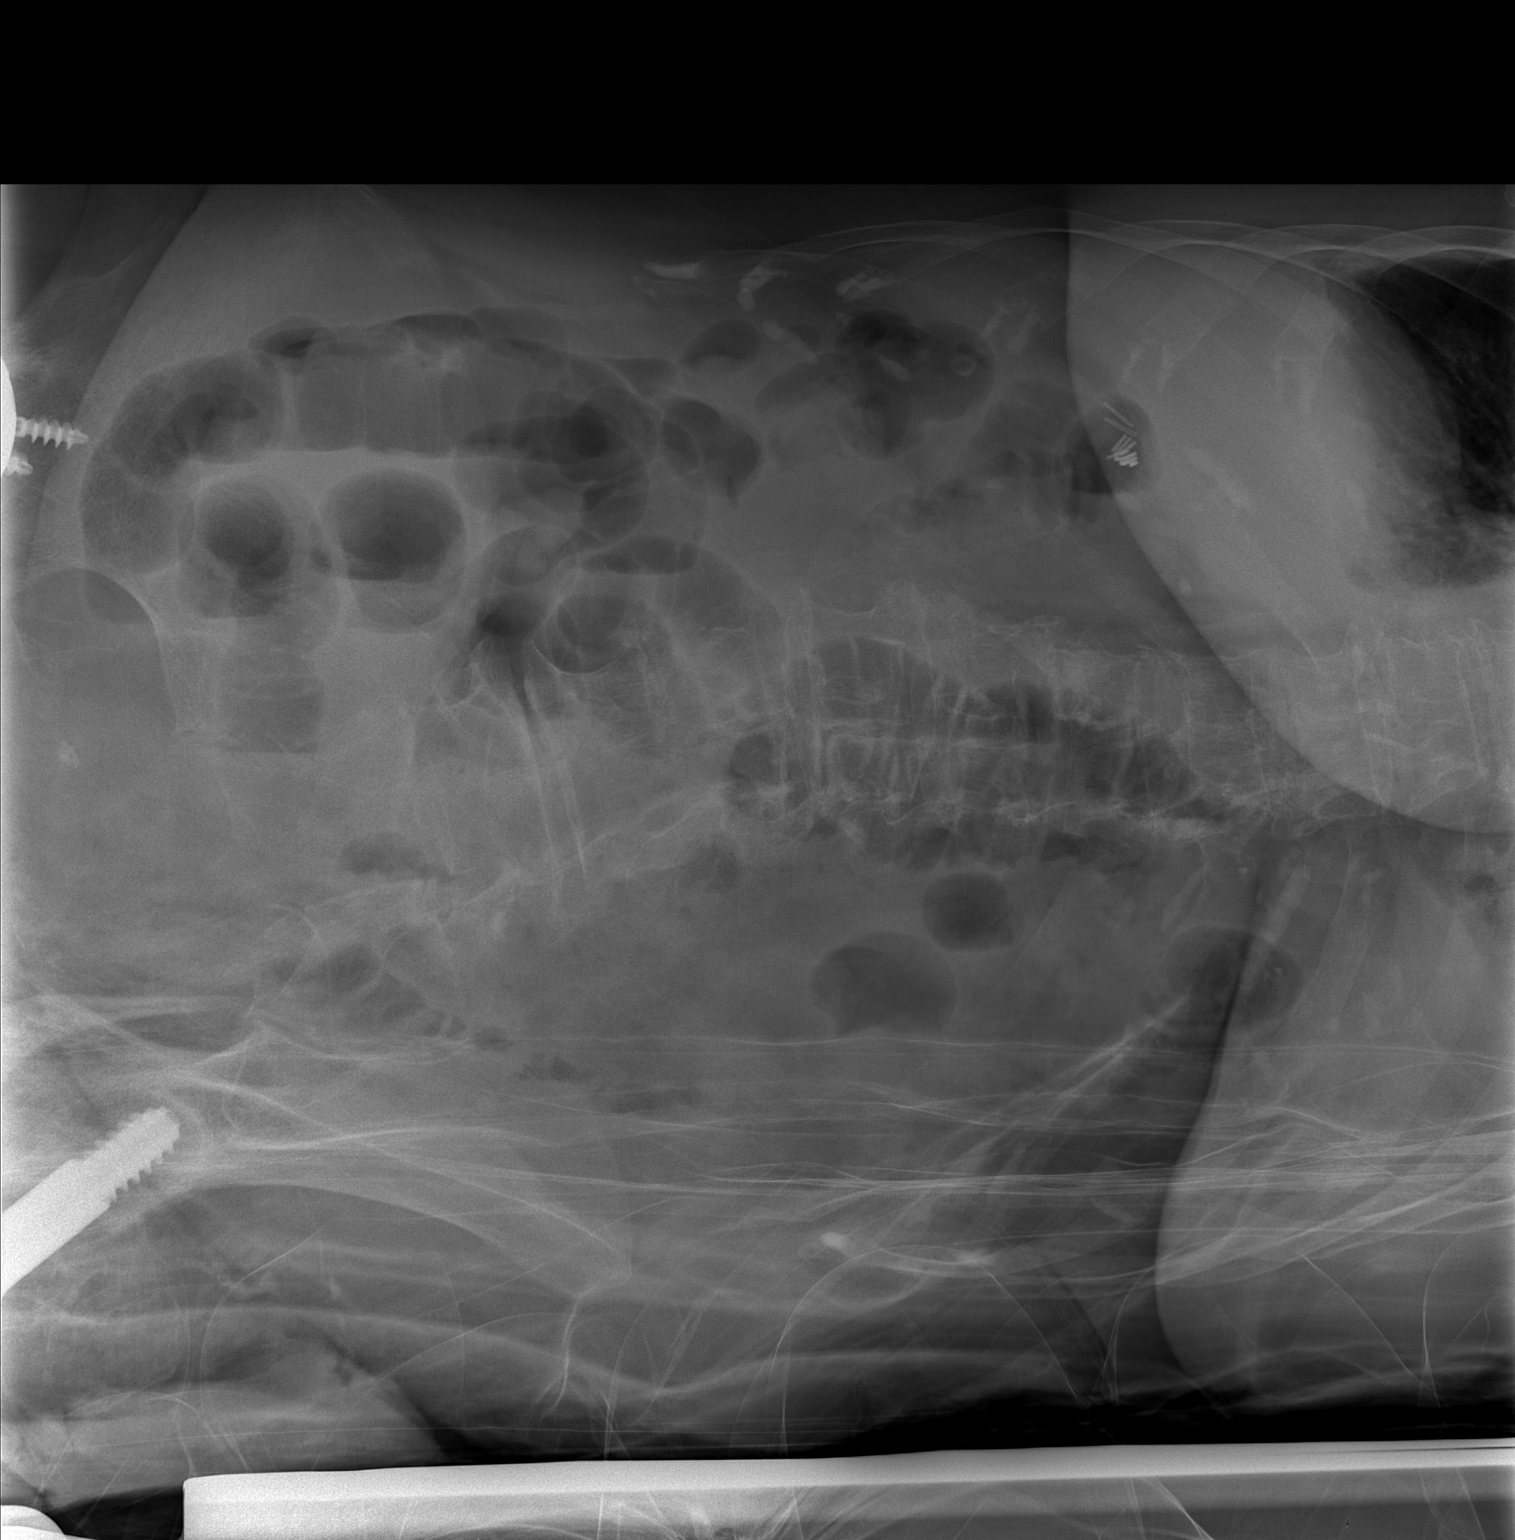

[view not recorded]
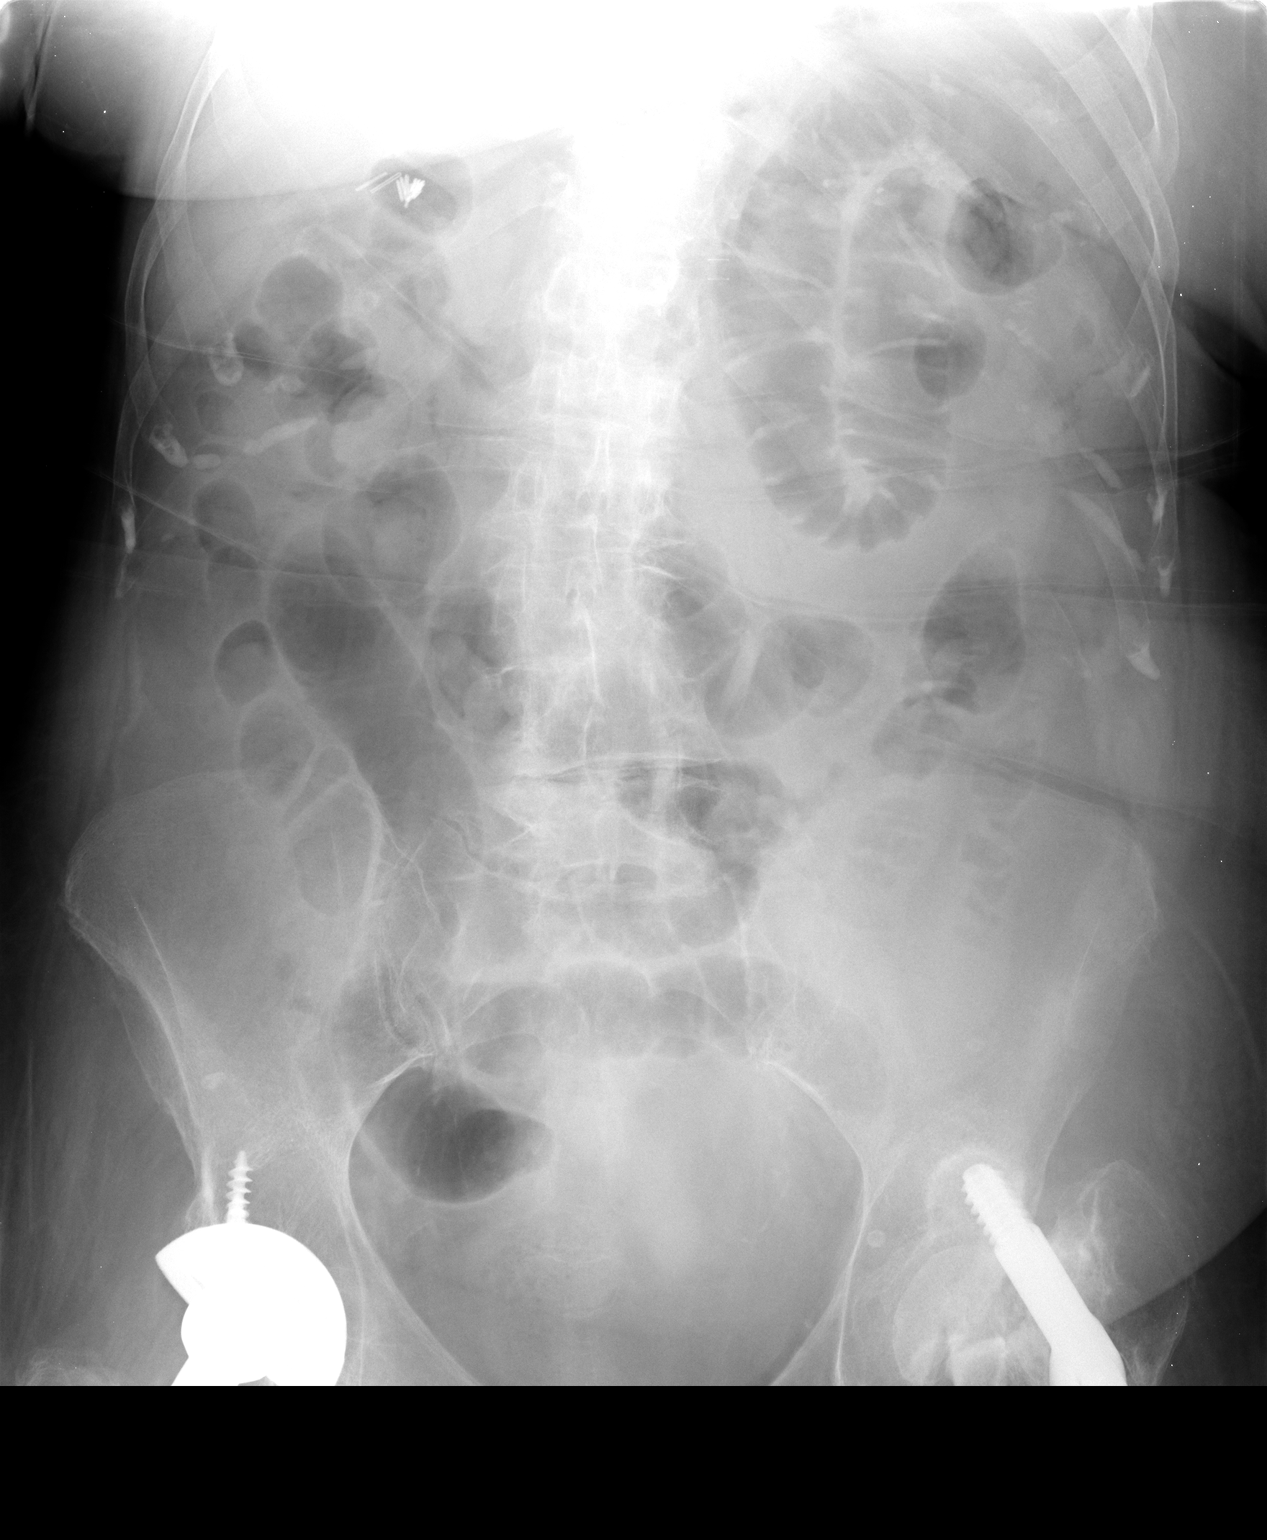

[2 of 2 positions shown; findings below may reference images not displayed]

FINDINGS: Supine and left lateral decubitus images were obtained. Several
loops of borderline dilated small bowel remain with scattered
air-fluid levels. No free air is appreciable.

There is a total hip prosthesis in the right. There is postoperative
change in the left proximal femur with resorption of the left
femoral head.
IMPRESSION: The bowel gas pattern is consistent with ileus or a degree of
partial obstruction. There is very little change compared to 1 day
prior. No free air seen.

## 2014-12-30 IMAGING — CR DG ABDOMEN 1V
1 series · 2 of 2 positions shown · non-contrast
Comparison: 12/24/2013.

CLINICAL DATA: Abdominal pain. Evaluate partial small bowel
obstruction.

EXAM:
ABDOMEN - 1 VIEW

[Series 1: ap (kub) · U · 2 of 2 slices shown]
[im 1/2]
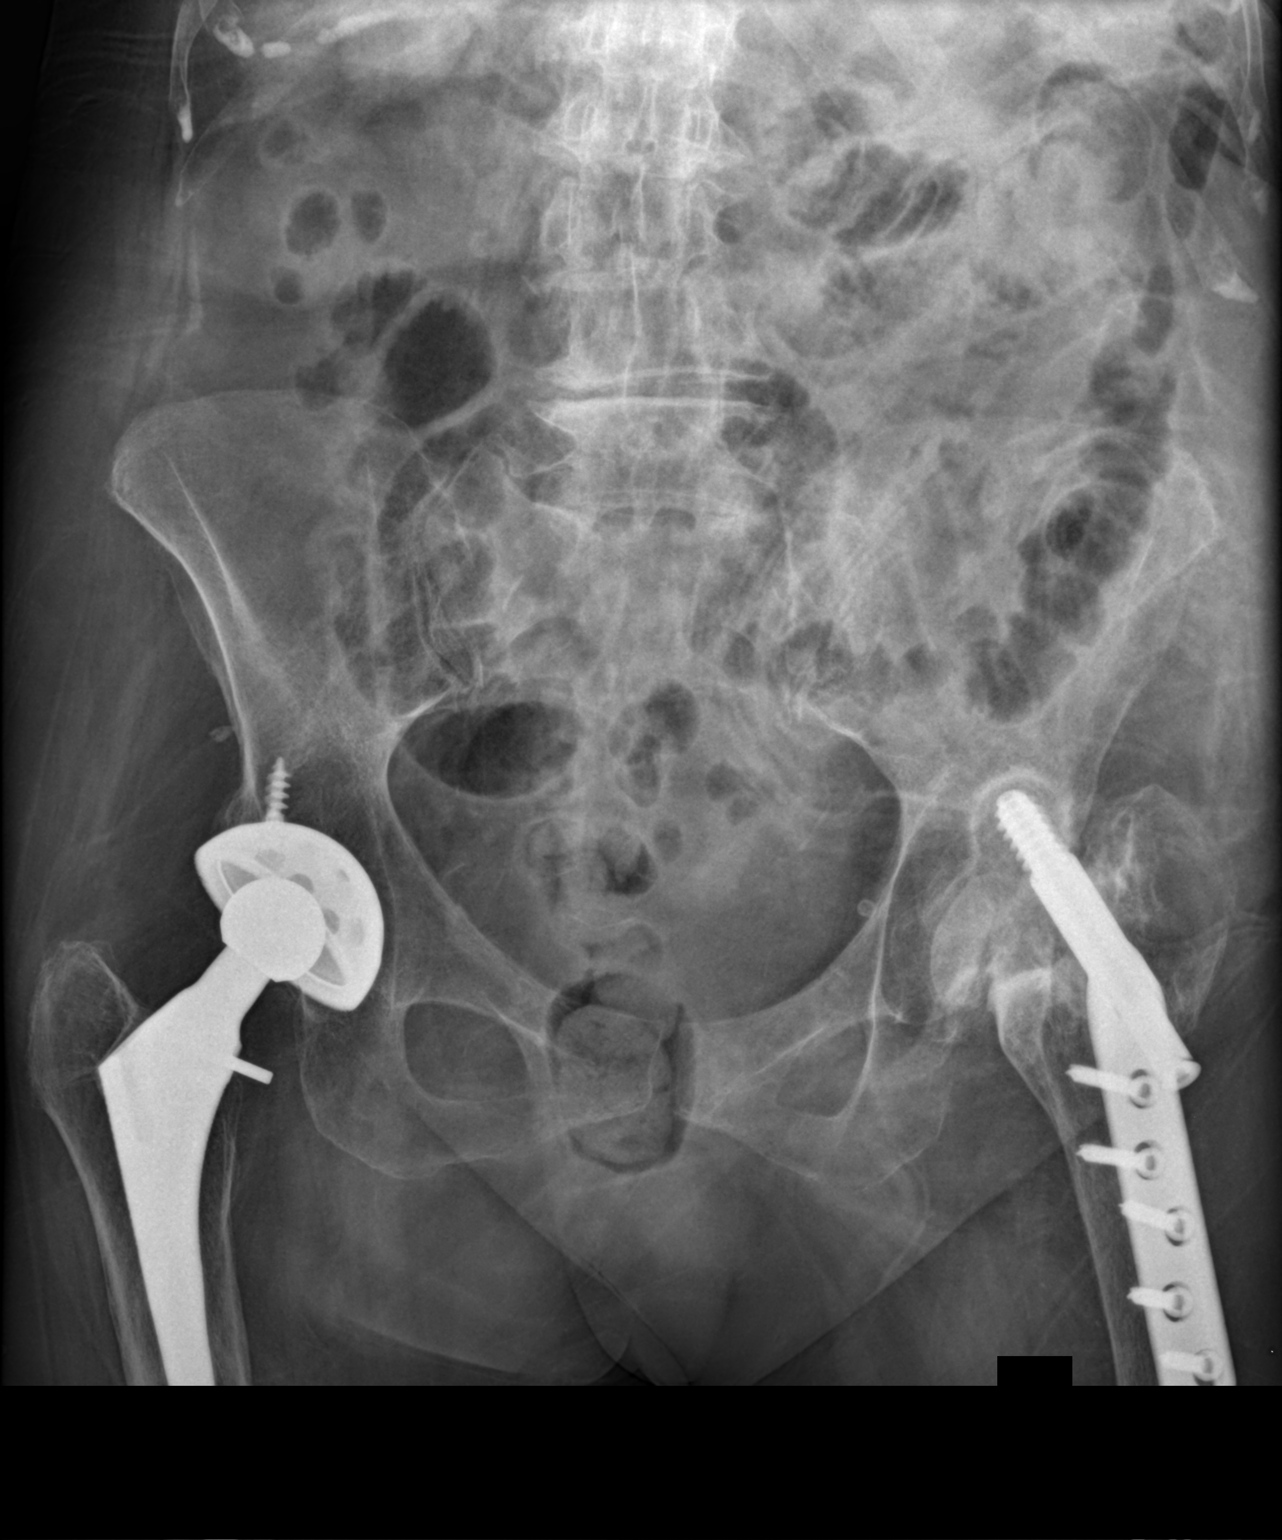
[im 2/2]
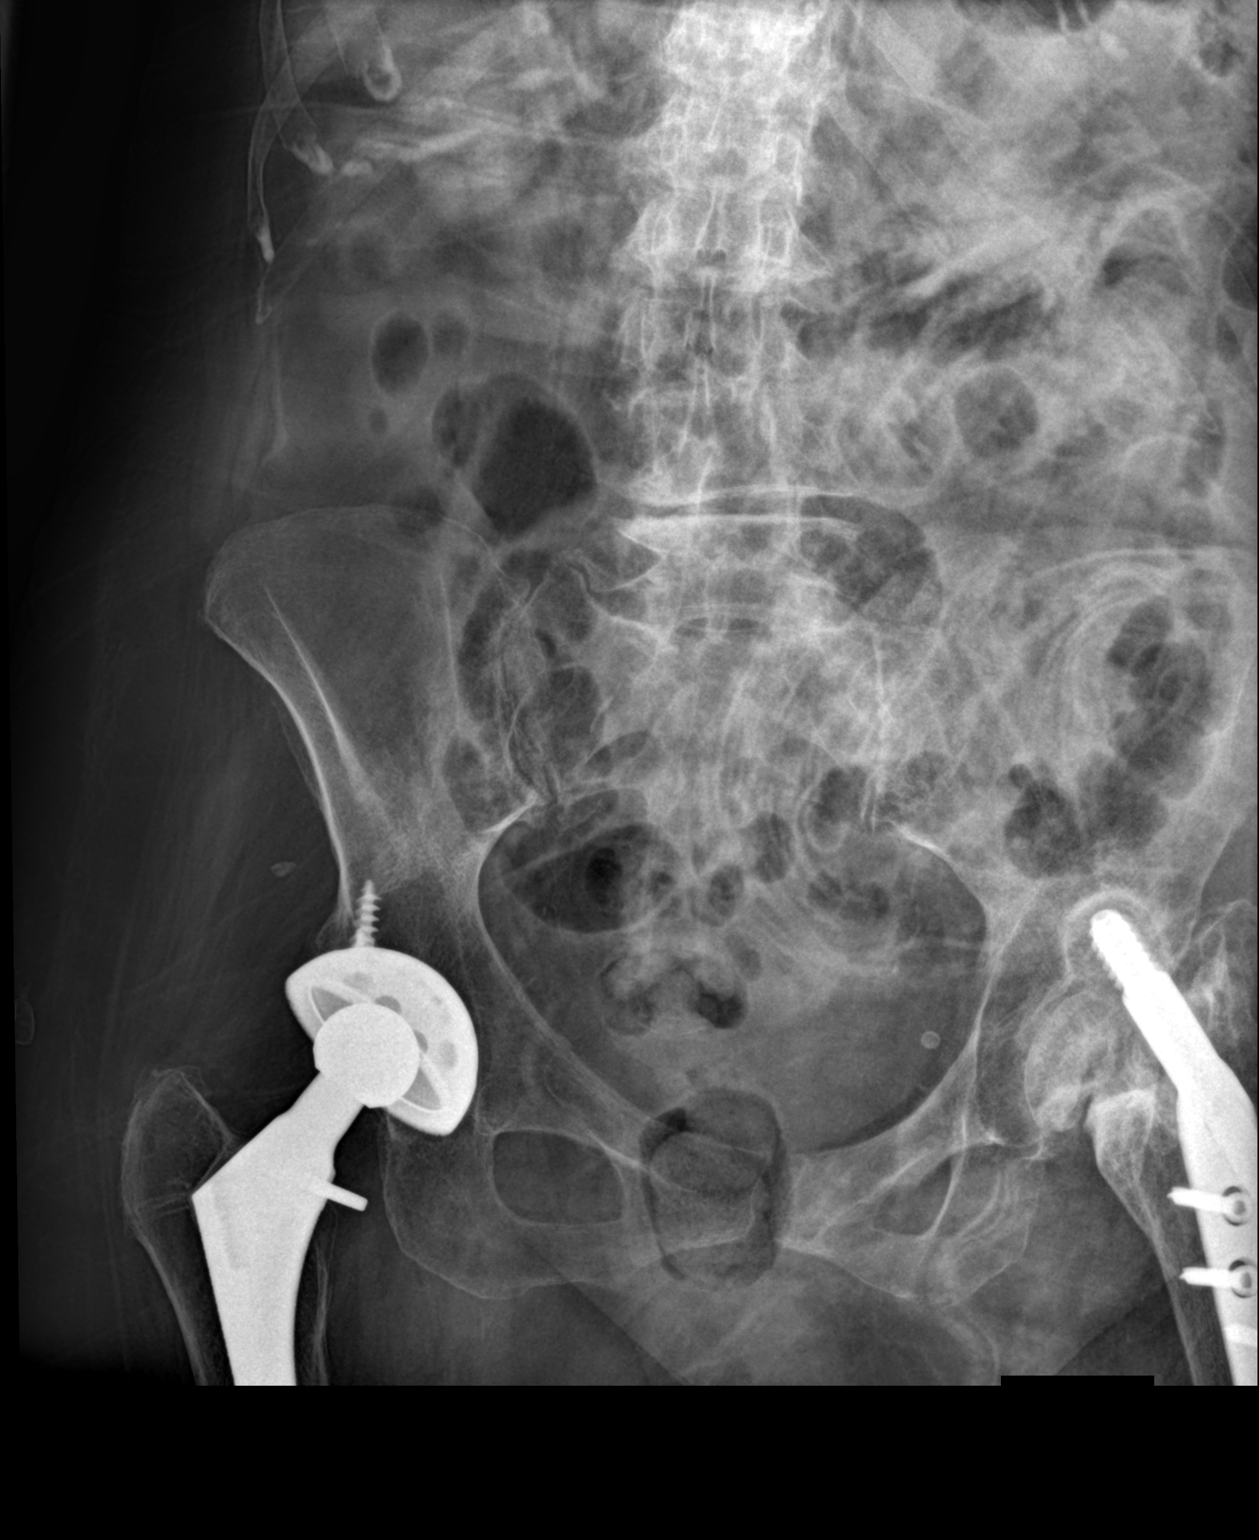

[2 of 2 positions shown; findings below may reference images not displayed]

FINDINGS: AP supine radiograph demonstrates normal caliber the visualized
small large bowel. Distal colonic gas can be seen in the rectum.
Little change compared with yesterday's examination. No decubitus
exam to evaluate for air-fluid levels or free air. Stable BILATERAL
hip instrumentation.
IMPRESSION: Grossly unchanged bowel gas pattern.  No gross bowel obstruction.

## 2015-03-23 NOTE — Progress Notes (Signed)
This encounter was created in error - please disregard.
